# Patient Record
Sex: Male | Born: 1960 | Race: White | Hispanic: No | Smoking: Former smoker
Health system: Southern US, Community
[De-identification: ages and names within clinical notes are randomized; demographics above are authoritative.]

## PROBLEM LIST (undated history)

## (undated) DIAGNOSIS — E119 Type 2 diabetes mellitus without complications: Secondary | ICD-10-CM

## (undated) DIAGNOSIS — T4145XA Adverse effect of unspecified anesthetic, initial encounter: Secondary | ICD-10-CM

## (undated) DIAGNOSIS — N200 Calculus of kidney: Secondary | ICD-10-CM

## (undated) DIAGNOSIS — E669 Obesity, unspecified: Secondary | ICD-10-CM

## (undated) DIAGNOSIS — I10 Essential (primary) hypertension: Secondary | ICD-10-CM

## (undated) DIAGNOSIS — I619 Nontraumatic intracerebral hemorrhage, unspecified: Secondary | ICD-10-CM

## (undated) DIAGNOSIS — I2510 Atherosclerotic heart disease of native coronary artery without angina pectoris: Secondary | ICD-10-CM

## (undated) DIAGNOSIS — G629 Polyneuropathy, unspecified: Secondary | ICD-10-CM

## (undated) DIAGNOSIS — Z8489 Family history of other specified conditions: Secondary | ICD-10-CM

## (undated) DIAGNOSIS — E785 Hyperlipidemia, unspecified: Secondary | ICD-10-CM

## (undated) DIAGNOSIS — R011 Cardiac murmur, unspecified: Secondary | ICD-10-CM

## (undated) DIAGNOSIS — E11610 Type 2 diabetes mellitus with diabetic neuropathic arthropathy: Secondary | ICD-10-CM

## (undated) DIAGNOSIS — G4730 Sleep apnea, unspecified: Secondary | ICD-10-CM

## (undated) DIAGNOSIS — T8859XA Other complications of anesthesia, initial encounter: Secondary | ICD-10-CM

## (undated) HISTORY — PX: VASECTOMY: SHX75

## (undated) HISTORY — PX: FOOT SURGERY: SHX648

## (undated) HISTORY — PX: CARPAL TUNNEL RELEASE: SHX101

## (undated) HISTORY — DX: Atherosclerotic heart disease of native coronary artery without angina pectoris: I25.10

## (undated) HISTORY — DX: Hyperlipidemia, unspecified: E78.5

## (undated) HISTORY — PX: COLONOSCOPY: SHX174

## (undated) HISTORY — PX: OTHER SURGICAL HISTORY: SHX169

---

## 2013-11-17 LAB — HM DIABETES EYE EXAM

## 2014-05-17 HISTORY — PX: NM MYOVIEW LTD: HXRAD82

## 2014-05-17 HISTORY — PX: TRANSTHORACIC ECHOCARDIOGRAM: SHX275

## 2014-06-13 ENCOUNTER — Emergency Department (HOSPITAL_COMMUNITY): Payer: BC Managed Care – PPO

## 2014-06-13 ENCOUNTER — Inpatient Hospital Stay (HOSPITAL_COMMUNITY): Payer: BC Managed Care – PPO

## 2014-06-13 ENCOUNTER — Encounter (HOSPITAL_COMMUNITY): Payer: Self-pay | Admitting: Emergency Medicine

## 2014-06-13 ENCOUNTER — Inpatient Hospital Stay (HOSPITAL_COMMUNITY)
Admission: EM | Admit: 2014-06-13 | Discharge: 2014-06-19 | DRG: 623 | Disposition: A | Discharge status: Home / Self Care | Payer: BC Managed Care – PPO | Attending: Internal Medicine | Admitting: Internal Medicine

## 2014-06-13 DIAGNOSIS — Z87891 Personal history of nicotine dependence: Secondary | ICD-10-CM

## 2014-06-13 DIAGNOSIS — Z6838 Body mass index (BMI) 38.0-38.9, adult: Secondary | ICD-10-CM | POA: Diagnosis not present

## 2014-06-13 DIAGNOSIS — Z9884 Bariatric surgery status: Secondary | ICD-10-CM | POA: Diagnosis not present

## 2014-06-13 DIAGNOSIS — D649 Anemia, unspecified: Secondary | ICD-10-CM | POA: Diagnosis present

## 2014-06-13 DIAGNOSIS — L03116 Cellulitis of left lower limb: Secondary | ICD-10-CM | POA: Diagnosis present

## 2014-06-13 DIAGNOSIS — E669 Obesity, unspecified: Secondary | ICD-10-CM

## 2014-06-13 DIAGNOSIS — Z8673 Personal history of transient ischemic attack (TIA), and cerebral infarction without residual deficits: Secondary | ICD-10-CM

## 2014-06-13 DIAGNOSIS — E1165 Type 2 diabetes mellitus with hyperglycemia: Secondary | ICD-10-CM | POA: Diagnosis present

## 2014-06-13 DIAGNOSIS — R9439 Abnormal result of other cardiovascular function study: Secondary | ICD-10-CM

## 2014-06-13 DIAGNOSIS — G4733 Obstructive sleep apnea (adult) (pediatric): Secondary | ICD-10-CM | POA: Diagnosis present

## 2014-06-13 DIAGNOSIS — B951 Streptococcus, group B, as the cause of diseases classified elsewhere: Secondary | ICD-10-CM | POA: Diagnosis present

## 2014-06-13 DIAGNOSIS — L84 Corns and callosities: Secondary | ICD-10-CM | POA: Diagnosis present

## 2014-06-13 DIAGNOSIS — E11621 Type 2 diabetes mellitus with foot ulcer: Principal | ICD-10-CM | POA: Diagnosis present

## 2014-06-13 DIAGNOSIS — E1169 Type 2 diabetes mellitus with other specified complication: Secondary | ICD-10-CM | POA: Diagnosis present

## 2014-06-13 DIAGNOSIS — E11628 Type 2 diabetes mellitus with other skin complications: Secondary | ICD-10-CM | POA: Diagnosis present

## 2014-06-13 DIAGNOSIS — I5022 Chronic systolic (congestive) heart failure: Secondary | ICD-10-CM | POA: Diagnosis present

## 2014-06-13 DIAGNOSIS — I2510 Atherosclerotic heart disease of native coronary artery without angina pectoris: Secondary | ICD-10-CM | POA: Diagnosis present

## 2014-06-13 DIAGNOSIS — A4901 Methicillin susceptible Staphylococcus aureus infection, unspecified site: Secondary | ICD-10-CM | POA: Diagnosis present

## 2014-06-13 DIAGNOSIS — I369 Nonrheumatic tricuspid valve disorder, unspecified: Secondary | ICD-10-CM

## 2014-06-13 DIAGNOSIS — Z8249 Family history of ischemic heart disease and other diseases of the circulatory system: Secondary | ICD-10-CM | POA: Diagnosis not present

## 2014-06-13 DIAGNOSIS — L97519 Non-pressure chronic ulcer of other part of right foot with unspecified severity: Secondary | ICD-10-CM | POA: Diagnosis present

## 2014-06-13 DIAGNOSIS — E11610 Type 2 diabetes mellitus with diabetic neuropathic arthropathy: Secondary | ICD-10-CM | POA: Diagnosis present

## 2014-06-13 DIAGNOSIS — E785 Hyperlipidemia, unspecified: Secondary | ICD-10-CM | POA: Diagnosis present

## 2014-06-13 DIAGNOSIS — M79672 Pain in left foot: Secondary | ICD-10-CM | POA: Diagnosis present

## 2014-06-13 DIAGNOSIS — L03119 Cellulitis of unspecified part of limb: Secondary | ICD-10-CM

## 2014-06-13 DIAGNOSIS — M216X1 Other acquired deformities of right foot: Secondary | ICD-10-CM | POA: Diagnosis present

## 2014-06-13 DIAGNOSIS — I10 Essential (primary) hypertension: Secondary | ICD-10-CM | POA: Diagnosis present

## 2014-06-13 DIAGNOSIS — R011 Cardiac murmur, unspecified: Secondary | ICD-10-CM | POA: Diagnosis present

## 2014-06-13 DIAGNOSIS — D72829 Elevated white blood cell count, unspecified: Secondary | ICD-10-CM | POA: Diagnosis present

## 2014-06-13 DIAGNOSIS — I6350 Cerebral infarction due to unspecified occlusion or stenosis of unspecified cerebral artery: Secondary | ICD-10-CM

## 2014-06-13 DIAGNOSIS — L02619 Cutaneous abscess of unspecified foot: Secondary | ICD-10-CM

## 2014-06-13 DIAGNOSIS — Z79899 Other long term (current) drug therapy: Secondary | ICD-10-CM | POA: Diagnosis not present

## 2014-06-13 DIAGNOSIS — L97529 Non-pressure chronic ulcer of other part of left foot with unspecified severity: Secondary | ICD-10-CM | POA: Diagnosis present

## 2014-06-13 DIAGNOSIS — Z794 Long term (current) use of insulin: Secondary | ICD-10-CM

## 2014-06-13 DIAGNOSIS — I639 Cerebral infarction, unspecified: Secondary | ICD-10-CM

## 2014-06-13 DIAGNOSIS — L089 Local infection of the skin and subcutaneous tissue, unspecified: Secondary | ICD-10-CM | POA: Diagnosis present

## 2014-06-13 DIAGNOSIS — Z01818 Encounter for other preprocedural examination: Secondary | ICD-10-CM

## 2014-06-13 DIAGNOSIS — I619 Nontraumatic intracerebral hemorrhage, unspecified: Secondary | ICD-10-CM | POA: Diagnosis present

## 2014-06-13 DIAGNOSIS — E1142 Type 2 diabetes mellitus with diabetic polyneuropathy: Secondary | ICD-10-CM | POA: Diagnosis present

## 2014-06-13 DIAGNOSIS — L0390 Cellulitis, unspecified: Secondary | ICD-10-CM | POA: Diagnosis present

## 2014-06-13 DIAGNOSIS — G4730 Sleep apnea, unspecified: Secondary | ICD-10-CM | POA: Diagnosis present

## 2014-06-13 DIAGNOSIS — E1121 Type 2 diabetes mellitus with diabetic nephropathy: Secondary | ICD-10-CM | POA: Diagnosis present

## 2014-06-13 DIAGNOSIS — E119 Type 2 diabetes mellitus without complications: Secondary | ICD-10-CM

## 2014-06-13 HISTORY — DX: Essential (primary) hypertension: I10

## 2014-06-13 HISTORY — DX: Type 2 diabetes mellitus with diabetic neuropathic arthropathy: E11.610

## 2014-06-13 HISTORY — DX: Adverse effect of unspecified anesthetic, initial encounter: T41.45XA

## 2014-06-13 HISTORY — DX: Polyneuropathy, unspecified: G62.9

## 2014-06-13 HISTORY — DX: Sleep apnea, unspecified: G47.30

## 2014-06-13 HISTORY — DX: Type 2 diabetes mellitus without complications: E11.9

## 2014-06-13 HISTORY — DX: Nontraumatic intracerebral hemorrhage, unspecified: I61.9

## 2014-06-13 HISTORY — DX: Other complications of anesthesia, initial encounter: T88.59XA

## 2014-06-13 HISTORY — DX: Cardiac murmur, unspecified: R01.1

## 2014-06-13 HISTORY — DX: Obesity, unspecified: E66.9

## 2014-06-13 LAB — CBC WITH DIFFERENTIAL/PLATELET
BASOS PCT: 0 % (ref 0–1)
Basophils Absolute: 0 10*3/uL (ref 0.0–0.1)
Eosinophils Absolute: 0 10*3/uL (ref 0.0–0.7)
Eosinophils Relative: 0 % (ref 0–5)
HEMATOCRIT: 39.5 % (ref 39.0–52.0)
Hemoglobin: 13.9 g/dL (ref 13.0–17.0)
LYMPHS ABS: 0.9 10*3/uL (ref 0.7–4.0)
Lymphocytes Relative: 6 % — ABNORMAL LOW (ref 12–46)
MCH: 29.8 pg (ref 26.0–34.0)
MCHC: 35.2 g/dL (ref 30.0–36.0)
MCV: 84.8 fL (ref 78.0–100.0)
MONO ABS: 0.9 10*3/uL (ref 0.1–1.0)
MONOS PCT: 6 % (ref 3–12)
NEUTROS ABS: 13.5 10*3/uL — AB (ref 1.7–7.7)
Neutrophils Relative %: 88 % — ABNORMAL HIGH (ref 43–77)
Platelets: 174 10*3/uL (ref 150–400)
RBC: 4.66 MIL/uL (ref 4.22–5.81)
RDW: 12.7 % (ref 11.5–15.5)
WBC: 15.4 10*3/uL — ABNORMAL HIGH (ref 4.0–10.5)

## 2014-06-13 LAB — GLUCOSE, CAPILLARY
GLUCOSE-CAPILLARY: 304 mg/dL — AB (ref 70–99)
Glucose-Capillary: 260 mg/dL — ABNORMAL HIGH (ref 70–99)

## 2014-06-13 LAB — BASIC METABOLIC PANEL
Anion gap: 15 (ref 5–15)
BUN: 16 mg/dL (ref 6–23)
CO2: 25 mEq/L (ref 19–32)
CREATININE: 0.55 mg/dL (ref 0.50–1.35)
Calcium: 9.2 mg/dL (ref 8.4–10.5)
Chloride: 95 mEq/L — ABNORMAL LOW (ref 96–112)
GFR calc Af Amer: >90 mL/min (ref >90)
GFR calc non Af Amer: >90 mL/min (ref >90)
Glucose, Bld: 307 mg/dL — ABNORMAL HIGH (ref 70–99)
Potassium: 4.4 mEq/L (ref 3.7–5.3)
Sodium: 135 mEq/L — ABNORMAL LOW (ref 137–147)

## 2014-06-13 MED ORDER — INSULIN GLARGINE 100 UNIT/ML ~~LOC~~ SOLN
10.0000 [IU] | Freq: Every day | SUBCUTANEOUS | Status: DC
  Administered 2014-06-13: 10 [IU] via SUBCUTANEOUS
  Filled 2014-06-13 (×2): qty 0.1

## 2014-06-13 MED ORDER — GABAPENTIN 600 MG PO TABS: 1200.0000 mg | ORAL_TABLET | Freq: Three times a day (TID) | ORAL | Status: DC

## 2014-06-13 MED ORDER — IRBESARTAN 75 MG PO TABS: 75.0000 mg | ORAL_TABLET | Freq: Every day | ORAL | Status: DC

## 2014-06-13 MED ORDER — VANCOMYCIN HCL 10 G IV SOLR
1500.0000 mg | Freq: Once | INTRAVENOUS | Status: AC
  Administered 2014-06-13: 1500 mg via INTRAVENOUS
  Filled 2014-06-13: qty 1500

## 2014-06-13 MED ORDER — METOPROLOL TARTRATE 1 MG/ML IV SOLN: 5.0000 mg | INTRAVENOUS | Status: DC | PRN

## 2014-06-13 MED ORDER — INSULIN ASPART 100 UNIT/ML ~~LOC~~ SOLN: 0.0000 [IU] | Freq: Every day | SUBCUTANEOUS | Status: DC

## 2014-06-13 MED ORDER — SODIUM CHLORIDE 0.9 % IV SOLN
INTRAVENOUS | Status: DC
Start: 2014-06-13 — End: 2014-06-14
  Administered 2014-06-14: 08:00:00 via INTRAVENOUS

## 2014-06-13 MED ORDER — TETANUS-DIPHTH-ACELL PERTUSSIS 5-2.5-18.5 LF-MCG/0.5 IM SUSP
0.5000 mL | Freq: Once | INTRAMUSCULAR | Status: DC
  Filled 2014-06-13: qty 0.5

## 2014-06-13 MED ORDER — HYDROCODONE-ACETAMINOPHEN 5-325 MG PO TABS
1.0000 | ORAL_TABLET | ORAL | Status: DC | PRN
  Administered 2014-06-14 (×2): 2 via ORAL
  Administered 2014-06-15 – 2014-06-17 (×4): 1 via ORAL
  Administered 2014-06-17: 2 via ORAL
  Filled 2014-06-13: qty 2
  Filled 2014-06-13: qty 1
  Filled 2014-06-13 (×2): qty 2
  Filled 2014-06-13: qty 1
  Filled 2014-06-13: qty 2
  Filled 2014-06-13: qty 1

## 2014-06-13 MED ORDER — VALSARTAN 160 MG PO TABS
320.0000 mg | ORAL_TABLET | Freq: Every day | ORAL | Status: DC
  Filled 2014-06-13: qty 2

## 2014-06-13 MED ORDER — CARVEDILOL 3.125 MG PO TABS
3.1250 mg | ORAL_TABLET | Freq: Two times a day (BID) | ORAL | Status: DC
  Filled 2014-06-13 (×15): qty 1

## 2014-06-13 MED ORDER — VITAMIN D 1000 UNITS PO TABS
20000.0000 [IU] | ORAL_TABLET | ORAL | Status: DC
  Administered 2014-06-14: 20000 [IU] via ORAL
  Filled 2014-06-13: qty 20

## 2014-06-13 MED ORDER — SODIUM CHLORIDE 0.9 % IV SOLN
Freq: Once | INTRAVENOUS | Status: AC
  Administered 2014-06-13: 14:00:00 via INTRAVENOUS

## 2014-06-13 MED ORDER — GUAIFENESIN-DM 100-10 MG/5ML PO SYRP: 5.0000 mL | ORAL_SOLUTION | ORAL | Status: DC | PRN

## 2014-06-13 MED ORDER — GABAPENTIN 400 MG PO CAPS
1200.0000 mg | ORAL_CAPSULE | Freq: Three times a day (TID) | ORAL | Status: DC
  Administered 2014-06-13 – 2014-06-19 (×14): 1200 mg via ORAL
  Filled 2014-06-13 (×19): qty 3

## 2014-06-13 MED ORDER — VANCOMYCIN HCL IN DEXTROSE 1-5 GM/200ML-% IV SOLN
1000.0000 mg | Freq: Once | INTRAVENOUS | Status: AC
  Administered 2014-06-13: 1000 mg via INTRAVENOUS
  Filled 2014-06-13: qty 200

## 2014-06-13 MED ORDER — VANCOMYCIN HCL IN DEXTROSE 1-5 GM/200ML-% IV SOLN
1000.0000 mg | Freq: Two times a day (BID) | INTRAVENOUS | Status: DC
  Administered 2014-06-14 – 2014-06-16 (×5): 1000 mg via INTRAVENOUS
  Filled 2014-06-13 (×5): qty 200

## 2014-06-13 MED ORDER — PIPERACILLIN-TAZOBACTAM 3.375 G IVPB 30 MIN
3.3750 g | Freq: Once | INTRAVENOUS | Status: AC
  Administered 2014-06-13: 3.375 g via INTRAVENOUS
  Filled 2014-06-13: qty 50

## 2014-06-13 MED ORDER — HEPARIN SODIUM (PORCINE) 5000 UNIT/ML IJ SOLN
5000.0000 [IU] | Freq: Three times a day (TID) | INTRAMUSCULAR | Status: DC
  Filled 2014-06-13 (×5): qty 1

## 2014-06-13 MED ORDER — SODIUM CHLORIDE 0.9 % IJ SOLN
3.0000 mL | Freq: Two times a day (BID) | INTRAMUSCULAR | Status: DC
  Administered 2014-06-13 – 2014-06-19 (×6): 3 mL via INTRAVENOUS

## 2014-06-13 MED ORDER — PIPERACILLIN-TAZOBACTAM 3.375 G IVPB
3.3750 g | Freq: Three times a day (TID) | INTRAVENOUS | Status: DC
  Administered 2014-06-13 – 2014-06-19 (×13): 3.375 g via INTRAVENOUS
  Filled 2014-06-13 (×19): qty 50

## 2014-06-13 MED ORDER — POLYETHYLENE GLYCOL 3350 17 G PO PACK
17.0000 g | PACK | Freq: Every day | ORAL | Status: DC | PRN
  Filled 2014-06-13: qty 1

## 2014-06-13 MED ORDER — CANAGLIFLOZIN 300 MG PO TABS
1.0000 | ORAL_TABLET | Freq: Every day | ORAL | Status: DC
  Administered 2014-06-14 – 2014-06-19 (×5): 300 mg via ORAL
  Filled 2014-06-13 (×7): qty 1

## 2014-06-13 MED ORDER — INSULIN ASPART 100 UNIT/ML ~~LOC~~ SOLN: 0.0000 [IU] | Freq: Three times a day (TID) | SUBCUTANEOUS | Status: DC

## 2014-06-13 MED ORDER — MORPHINE SULFATE 2 MG/ML IJ SOLN
2.0000 mg | INTRAMUSCULAR | Status: DC | PRN
Start: 2014-06-13 — End: 2014-06-19
  Filled 2014-06-13: qty 1

## 2014-06-13 NOTE — Progress Notes (Signed)
Order noted for nocturnal CPAP. Patient has home equipment in room and wishes to use tonight. Looks appropriate and turns on/off as it should, although it is missing the power button. When placing machine on the bedside table, the machine shorted out and stopped, but started back when the power cord was readjusted at the port on the machine. Patient educated about safety risks of using machine. He is agreeable to use hospital supplied equipment this admission and states he will follow up with his PCP to obtain a new machine when he goes home. Visitor at bedside, who took his home equipment with her.

## 2014-06-13 NOTE — ED Provider Notes (Addendum)
CSN: [id number]     Arrival date & time 06/13/14  1052 History   First MD Initiated Contact with Patient 06/13/14 1245     Chief Complaint  Patient presents with  . lef foot infected      (Consider location/radiation/quality/duration/timing/severity/associated sxs/prior Treatment) HPI Comments: Patient presents with a foot infection. He has a history of diabetes mellitus. He's had a large callus with underlying ulcer to his right foot for which he has been receiving care at the wound care clinic for about 6 weeks. He noticed that 3 days ago his foot started to swell. Last night he noticed some redness to the top of the foot and he woke up this morning with a large amount of redness and the blister to the top of his foot. He was seen at [facility] urgent care and was advised to come here for further treatment. He denies any fevers. He has had some drainage from the foot over the weekend. He's had prior surgery for hammer toe to the foot about 2 years ago and has hardware in place. The surgery was done in [facility].   Past Medical History  Diagnosis Date  . Diabetes mellitus without complication   . Stroke   . Hypertension   . Sleep apnea    Past Surgical History  Procedure Laterality Date  . Lap band surgery    . Foot surgery     No family history on file. History  Substance Use Topics  . Smoking status: Never Smoker   . Smokeless tobacco: Not on file  . Alcohol Use: No    Review of Systems  Constitutional: Negative for fever, chills, diaphoresis and fatigue.  HENT: Negative for congestion, rhinorrhea and sneezing.   Eyes: Negative.   Respiratory: Negative for cough, chest tightness and shortness of breath.   Cardiovascular: Negative for chest pain and leg swelling.  Gastrointestinal: Negative for nausea, vomiting, abdominal pain, diarrhea and blood in stool.  Genitourinary: Negative for frequency, hematuria, flank pain and difficulty urinating.  Musculoskeletal:  Positive for arthralgias. Negative for back pain.  Skin: Positive for wound. Negative for rash.  Neurological: Negative for dizziness, speech difficulty, weakness, numbness and headaches.      Allergies  Ciprofloxacin; Eggs or egg-derived products; Nsaids; and Statins  Home Medications   Prior to Admission medications   Medication Sig Start Date End Date Taking? Authorizing Provider  Canagliflozin (INVOKANA) 300 MG TABS Take 1 tablet by mouth daily.   Yes Historical Provider, MD  celecoxib (CELEBREX) 400 MG capsule Take 400 mg by mouth 2 (two) times daily.   Yes Historical Provider, MD  cholecalciferol (VITAMIN D) 1000 UNITS tablet Take 20,000-50,000 Units by mouth once a week.   Yes Historical Provider, MD  Cyanocobalamin (VITAMIN B 12 PO) Take 1 tablet by mouth daily.   Yes Historical Provider, MD  gabapentin (NEURONTIN) 600 MG tablet Take 1,200 mg by mouth 3 (three) times daily.   Yes Historical Provider, MD  HYDROcodone-acetaminophen (NORCO/VICODIN) 5-325 MG per tablet Take 1 tablet by mouth every 6 (six) hours as needed for moderate pain.   Yes Historical Provider, MD  metFORMIN (GLUMETZA) 1000 MG (MOD) 24 hr tablet Take 1,000 mg by mouth 2 (two) times daily with a meal.   Yes Historical Provider, MD  Testosterone (ANDROGEL PUMP TD) Place 2 drops onto the skin daily. 2 drops= pumps   Yes Historical Provider, MD  traMADol (ULTRAM) 50 MG tablet Take 100 mg by mouth 3 (three) times daily.  Yes Historical Provider, MD  valsartan (DIOVAN) 320 MG tablet Take 320 mg by mouth daily.   Yes Historical Provider, MD   BP 132/52  Pulse 93  Temp(Src) 98.9 F (37.2 C) (Oral)  Resp 18  Ht 5' 8.5" (1.74 m)  Wt 260 lb (117.935 kg)  BMI 38.95 kg/m2  SpO2 96% Physical Exam  Constitutional: He is oriented to person, place, and time. He appears well-developed and well-nourished.  HENT:  Head: Normocephalic and atraumatic.  Eyes: Pupils are equal, round, and reactive to light.  Neck: Normal  range of motion. Neck supple.  Cardiovascular: Normal rate, regular rhythm and normal heart sounds.   Pulmonary/Chest: Effort normal and breath sounds normal. No respiratory distress. He has no wheezes. He has no rales. He exhibits no tenderness.  Abdominal: Soft. Bowel sounds are normal. There is no tenderness. There is no rebound and no guarding.  Musculoskeletal: Normal range of motion.  Patient has marked erythema and swelling involving the entire left foot. He has a necrotic-appearing blister on the dorsal surface of the foot at the second toe. He has a large callus on the ball of his left foot. There is a 1 cm central syrup with serous drainage from the wound. Pedal pulses are intact.  Lymphadenopathy:    He has no cervical adenopathy.  Neurological: He is alert and oriented to person, place, and time.  Skin: Skin is warm and dry. No rash noted.  Psychiatric: He has a normal mood and affect.    ED Course  Procedures (including critical care time) Labs Review Labs Reviewed  CBC WITH DIFFERENTIAL - Abnormal; Notable for the following:    WBC 15.4 (*)    Neutrophils Relative % 88 (*)    Neutro Abs 13.5 (*)    Lymphocytes Relative 6 (*)    All other components within normal limits  BASIC METABOLIC PANEL - Abnormal; Notable for the following:    Sodium 135 (*)    Chloride 95 (*)    Glucose, Bld 307 (*)    All other components within normal limits  CULTURE, BLOOD (ROUTINE X 2)  CULTURE, BLOOD (ROUTINE X 2)  WOUND CULTURE    Imaging Review Dg Foot Complete Left  06/13/2014   CLINICAL DATA:  Diabetes with left wound in the foot between the first and second toes.  EXAM: LEFT FOOT - COMPLETE 3+ VIEW  COMPARISON:  None.  FINDINGS: Three-view exam shows the patient to be status post fusion across the MTP joint of the great toe after resection of the metatarsal head. There has been resection of the base of the second and third toe proximal phalanges. Prominent soft tissue swelling is seen  in the soft tissues overlying the second metatarsal head. There is gas within the soft tissues which may be related to an open wound or abscess.  On the oblique view, there is some subtle lucency around the second most distal screw of the great toe fusion plate. While no gross bony destruction is evident, infection of the hardware cannot be excluded.  IMPRESSION: Status post first metatarsal head resection with fusion across the joint using a plate and screw fixation device. There is some lucency around the second most distal screw raising the question for infection. Substantial soft tissue swelling is seen between the first and second toes with gas in the soft tissues compatible with an open wound or underlying abscess.   Electronically Signed   By: Kennith Center M.D.   On: 06/13/2014 14:26  EKG Interpretation None      MDM   Final diagnoses:  Cellulitis of foot, left    Patient presents with marked cellulitis of his left foot with an area that looks suspicious for some gangrenous tissue. On x-ray there is a question of some infection around the hardware in the joint. Patient originally had surgery that was done at [facility]. I asked him if he wanted to be transferred there to see his original surgeon. He would rather stay here in [place].  Patient was started on broad-spectrum antibiotics including Zosyn and vancomycin.  15:00 spoke with Dr. Magnus Ivan with orthopedic surgery who will see pt this evening. 15:20 spoke with Dr. Thedore Mins with [facility] who will admit pt.  15:30 pt was found eating in his room.  Advised pt not to eat or drink until seen by ortho.  Rolan Bucco, MD 06/13/14 1520  Rolan Bucco, MD 06/13/14 [phone]

## 2014-06-13 NOTE — ED Notes (Signed)
MD at bedside. EDP BELFI PRESENT

## 2014-06-13 NOTE — ED Notes (Signed)
Pt was seen at [facility] and was told foot was septic and need to go to ED.

## 2014-06-13 NOTE — ED Notes (Signed)
MD at bedside. Cardiology at bedside

## 2014-06-13 NOTE — Progress Notes (Signed)
ANTIBIOTIC CONSULT NOTE - INITIAL  Pharmacy Consult for Vancomycin and Zosyn Indication: Osteomyelitis, foot infection  Allergies  Allergen Reactions  . Tetanus Toxoids     "Sore arm all the way down, Painful, red"  . Ciprofloxacin Other (See Comments)    Altered mental status  . Eggs Or Egg-Derived Products Diarrhea    Constant flu after flu shot.  . Nsaids Other (See Comments)    Cannot take nsaids-  etc due to heart   . Statins Other (See Comments)    York Spaniel it causes pain?    Patient Measurements: Height: 5' 8.5" (174 cm) Weight: 260 lb (117.935 kg) IBW/kg (Calculated) : 69.55  Vital Signs: Temp: 98.9 F (37.2 C) (09/28 1352) Temp src: Oral (09/28 1352) BP: 136/70 mmHg (09/28 1648) Pulse Rate: 101 (09/28 1648) Intake/Output from previous day:   Intake/Output from this shift: Total I/O In: 200 [I.V.:200] Out: -   Labs:  Recent Labs  06/13/14 1330  WBC 15.4*  HGB 13.9  PLT 174  CREATININE 0.55   Estimated Creatinine Clearance: 131.2 ml/min (by C-G formula based on Cr of 0.55). No results found for this basename: VANCOTROUGH, VANCOPEAK, VANCORANDOM, GENTTROUGH, GENTPEAK, GENTRANDOM, TOBRATROUGH, TOBRAPEAK, TOBRARND, AMIKACINPEAK, AMIKACINTROU, AMIKACIN,  in the last 72 hours   Microbiology: No results found for this or any previous visit (from the past 720 hour(s)).  Medical History: Past Medical History  Diagnosis Date  . Diabetes mellitus   . Hemorrhagic stroke     a. Dx 2010. 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 Unclear etiology.  . Hypertension   . Sleep apnea   . Neuropathy   . Diabetic Charcot foot   . Murmur, heart Fall 2014 - diagnosed  . Complication of anesthesia     hx deviated septum, sleep apnea- has had problems  . Obesity     a. hx of lap band surgery.    Medications:  Anti-infectives   Start     Dose/Rate Route Frequency Ordered Stop   06/13/14 1315  piperacillin-tazobactam (ZOSYN) IVPB 3.375 g     3.375 g 100 mL/hr over 30  Minutes Intravenous  Once 06/13/14 1314 06/13/14 1634   06/13/14 1315  vancomycin (VANCOCIN) IVPB 1000 mg/200 mL premix     1,000 mg 200 mL/hr over 60 Minutes Intravenous  Once 06/13/14 1314 06/13/14 1530     Assessment: 53 yoM [facility] ED on [facility]/28 with acute left foot infection and chronic right foot wound. PMH includes DM with chronic charcot changes in right foot; pt states MRI at [facility] was negative for infection. He is now seeking care for left foot pain, redness, drainage. First doses and Vancomycin and Zosyn were given in ED; plan for I&D in OR on 9/29. Pharmacy is consulted to dose Vancomycin and Zosyn.  9/28 >> Vanc >> 9/28 >> Zosyn >>   Today, 9/28   Tmax: 98.9  WBCs: 15.4  Renal: SCr 0.55, CrCl > 100 ml/min CG, N  Blood and wound cultures pending   Goal of Therapy:  Vancomycin trough level 15-20 mcg/ml Appropriate abx dosing, eradication of infection.   Plan:   Zosyn 3.375g IV Q8H infused over 4hrs.   Vancomycin  (along with  already given for total loading dose )  Vancomycin 1g IV q12h.  Measure Vanc trough at steady state.  Follow up renal fxn and culture results.   Lynann Beaver PharmD, BCPS Pager [phone] 06/13/2014 6:16 PM

## 2014-06-13 NOTE — Progress Notes (Signed)
Utilization review completed.  

## 2014-06-13 NOTE — ED Notes (Signed)
Xray and Ultrasound at bedside.

## 2014-06-13 NOTE — Progress Notes (Signed)
Echocardiogram 2D Echocardiogram has been performed.  Evann Koelzer 06/13/2014, 4:45 PM

## 2014-06-13 NOTE — Consult Note (Signed)
Patient seen and personally examined and chart reviewed.  Agree with note as outlined by Lucile Crater.  No history of chest pain or cardiac disease other than heart murmur.  Was exercising some up until 6 months ago when he stopped because he has a new girlfriend and had no time.  Last nuclear stress test in 2009.  His CRF include age morbid obesity, HTN, DM. Cannot assess for exertional angina due to inability to exercise recently and he has not really exercised in 6 months.  His EKG shows early R wave transition and cannot rule out old posterior MI.  ECHO showed mildly reduced LVF with EF 45-50% but technically poor acoustical windows so cannot rule out RWMA's.  I think it is prudent to risk stratify further for ischemia since we cannot assess if he has exertional angina and he has multiple CRF.  Will make NPO after MN for Lexiscan myoview in am.

## 2014-06-13 NOTE — Consult Note (Signed)
Reason for Consult:  Left foot with acute infection and right foot with chronic wound Referring Physician:   EDP  Alexander Pennington is an 53 y.o. male.  HPI: 53 yo male diabetic with bilateral foot issues.  Is normally seen at [facility] in [place], but now prefers to be seen here.  He has had chronic charcot changes to his right foot and a wound that has been treated for a long period of time.  He states that a MRI at [facility] was negative for an infection.  However, he developed acute left foot pain, swelling, redness, and drainage over the past few days and came to [facility] in [place] for further evaluation and treatment.  He does report a left foot 1st MTP fusion about 2 years ago.  Past Medical History  Diagnosis Date  . Diabetes mellitus   . Hemorrhagic stroke     a. Dx 2010. 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 Unclear etiology.  . Hypertension   . Sleep apnea   . Neuropathy   . Diabetic Charcot foot   . Murmur, heart Fall 2014 - diagnosed  . Complication of anesthesia     hx deviated septum, sleep apnea- has had problems  . Obesity     a. hx of lap band surgery.    Past Surgical History  Procedure Laterality Date  . Lap band surgery      2009  . Foot surgery      foot surgery x 2  . Carpal tunnel release Left     bone removed also, nerve also cut    Family History  Problem Relation Age of Onset  . Heart attack Father     Social History:  reports that he quit smoking about 26 years ago. His smoking use included Cigarettes. He smoked 0.00 packs per day. He has never used smokeless tobacco. He reports that he drinks alcohol. He reports that he does not use illicit drugs.  Allergies:  Allergies  Allergen Reactions  . Tetanus Toxoids     "Sore arm all the way down, Painful, red"  . Ciprofloxacin Other (See Comments)    Altered mental status  . Eggs Or Egg-Derived Products Diarrhea    Constant flu after flu shot.  . Nsaids Other (See Comments)   Cannot take nsaids-  etc due to heart   . Statins Other (See Comments)    Michela Pitcher it causes pain?    Medications: I have reviewed the patient's current medications.  Results for orders placed during the hospital encounter of 06/13/14 (from the past 48 hour(s))  CBC WITH DIFFERENTIAL     Status: Abnormal   Collection Time    06/13/14  1:30 PM      Result Value Ref Range   WBC 15.4 (*) 4.0 - 10.5 K/uL   RBC 4.66  4.22 - 5.81 MIL/uL   Hemoglobin 13.9  13.0 - 17.0 g/dL   HCT 39.5  39.0 - 52.0 %   MCV 84.8  78.0 - 100.0 fL   MCH 29.8  26.0 - 34.0 pg   MCHC 35.2  30.0 - 36.0 g/dL   RDW 12.7  11.5 - 15.5 %   Platelets 174  150 - 400 K/uL   Neutrophils Relative % 88 (*) 43 - 77 %   Neutro Abs 13.5 (*) 1.7 - 7.7 K/uL   Lymphocytes Relative 6 (*) 12 - 46 %   Lymphs Abs 0.9  0.7 - 4.0 K/uL   Monocytes Relative 6  3 - 12 %   Monocytes Absolute 0.9  0.1 - 1.0 K/uL   Eosinophils Relative 0  0 - 5 %   Eosinophils Absolute 0.0  0.0 - 0.7 K/uL   Basophils Relative 0  0 - 1 %   Basophils Absolute 0.0  0.0 - 0.1 K/uL  BASIC METABOLIC PANEL     Status: Abnormal   Collection Time    06/13/14  1:30 PM      Result Value Ref Range   Sodium 135 (*) 137 - 147 mEq/L   Potassium 4.4  3.7 - 5.3 mEq/L   Chloride 95 (*) 96 - 112 mEq/L   CO2 25  19 - 32 mEq/L   Glucose, Bld 307 (*) 70 - 99 mg/dL   BUN 16  6 - 23 mg/dL   Creatinine, Ser 0.55  0.50 - 1.35 mg/dL   Calcium 9.2  8.4 - 10.5 mg/dL   GFR calc non Af Amer >90  >90 mL/min   GFR calc Af Amer >90  >90 mL/min   Comment: (NOTE)     The eGFR has been calculated using the CKD EPI equation.     This calculation has not been validated in all clinical situations.     eGFR's persistently <90 mL/min signify possible Chronic Kidney     Disease.   Anion gap 15  5 - 15    Dg Chest Port 1 View  06/13/2014   CLINICAL DATA:  Asthma. Heart murmur. Short of breath. Preoperative chest radiograph.  EXAM: PORTABLE CHEST - 1 VIEW  COMPARISON:  None.  FINDINGS:  Cardiopericardial silhouette within normal limits. Mediastinal contours normal. Trachea midline. No airspace disease or effusion. Mild basilar atelectasis. Lung volumes are mildly reduced.  IMPRESSION: No active disease.   Electronically Signed   By: Dereck Ligas M.D.   On: 06/13/2014 16:18   Dg Foot Complete Left  06/13/2014   CLINICAL DATA:  Diabetes with left wound in the foot between the first and second toes.  EXAM: LEFT FOOT - COMPLETE 3+ VIEW  COMPARISON:  None.  FINDINGS: Three-view exam shows the patient to be status post fusion across the MTP joint of the great toe after resection of the metatarsal head. There has been resection of the base of the second and third toe proximal phalanges. Prominent soft tissue swelling is seen in the soft tissues overlying the second metatarsal head. There is gas within the soft tissues which may be related to an open wound or abscess.  On the oblique view, there is some subtle lucency around the second most distal screw of the great toe fusion plate. While no gross bony destruction is evident, infection of the hardware cannot be excluded.  IMPRESSION: Status post first metatarsal head resection with fusion across the joint using a plate and screw fixation device. There is some lucency around the second most distal screw raising the question for infection. Substantial soft tissue swelling is seen between the first and second toes with gas in the soft tissues compatible with an open wound or underlying abscess.   Electronically Signed   By: Misty Stanley M.D.   On: 06/13/2014 14:26    ROS Blood pressure 136/70, pulse 101, temperature 98.9 F (37.2 C), temperature source Oral, resp. rate 12, height 5' 8.5" (1.74 m), weight 117.935 kg (260 lb), SpO2 98.00%. 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 2)  He will be started on IV antibiotics and the plan will be to take him to the OR late tomorrow 9/29 for an irrigation and debridement of both feet.  He may eventually need hardware removed from his left foot MTP joint.  3)  He needs goo blood glucose control.   Mcarthur Rossetti 06/13/2014, 5:28 PM

## 2014-06-13 NOTE — Progress Notes (Signed)
Pt refuses doctor recommended diet. Pt wants to control his diet while in the hospital.

## 2014-06-13 NOTE — Consult Note (Signed)
Cardiology Consultation Note  Patient ID: Alexander Pennington, MRN: [id number], DOB/AGE: 1960/10/21 53 y.o. 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 Prior echo 2005 showed normal LVF, mild LVH, grade 2/2. He reports an echo in 08/2013 at [facility] for a heart murmur that showed "85% capacity" and mildly leaky valve - report is not available in CareEverywhere. He also reports what sounds like LE arterial duplex several months ago and says he was told the results were "okay and in line with what's expected for a diabetic." He has h/o foot surgery with hardware 2 years ago. Over the last several days, he developed subjective fever and increased redness of the left foot associated with a blister that popped so presented to the ED for evaluation. Workup is consistent with left foot cellulitis/abscess, possible osteomyelitis. He also has a right foot plantar open sore with infection. Orthopedics was consulted. It is felt he will likely require surgery and PICC with prolonged abx. We are asked to see for perioperative risk assessment. He used to exercise 6 months ago going 2. at 8% incline for 2 miles but says he stopped this due to a girlfriend entering the picture. He denies any exertional chest pain or dyspnea with ADLs or intercourse. No syncope, palpitations, nausea, vomiting, or prior MI. He has not had any chest pain or pressure at rest.   Past Medical History  Diagnosis Date  . Diabetes mellitus without complication   . Hemorrhagic stroke     a. Dx 2010. Patient  reports it was venous related. Unclear etiology.  . Hypertension   . Sleep apnea   . Neuropathy   . Diabetic Charcot foot   . Murmur, heart Fall 2014 - diagnosed  . Complication of anesthesia     hx deviated septum, sleep apnea- has had problems  . Obesity       Most Recent Cardiac Studies: 2D echo 2005 per Care Everywhere INTERPRETATION --------------------------------------------------------------- NORMAL LEFT VENTRICULAR FUNCTION WITH MILD LVH DIASTOLIC FUNCTION CLASS: RELAXATION ABNORMALITY (GRADE 2) CORRESPONDS TO PSEUDONORMAL VALVULAR REGURGITATION: TRIVIAL MR, TRIVIAL TR NO VALVULAR STENOSIS   Surgical History:  Past Surgical History  Procedure Laterality Date  . Lap band surgery    . Foot surgery      foot surgery x 2  . Heart ultrasound    . Carpal tunnel release Left     bone removed also, nerve also cut     Home Meds: Prior to Admission medications   Medication Sig Start Date End Date Taking? Authorizing Provider  Canagliflozin (INVOKANA) 300 MG TABS Take 1 tablet by mouth daily.   Yes Historical Provider, MD  celecoxib (CELEBREX) 400 MG capsule Take 400 mg by mouth 2 (two) times daily.   Yes Historical Provider, MD  cholecalciferol (VITAMIN D) 1000 UNITS tablet Take 20,000-50,000 Units by mouth once a week.   Yes Historical Provider, MD  Folic Acid-Vit B6-Vit B12 ([name]) 2.5-25-1 MG TABS tablet Take 1 tablet by mouth daily.   Yes Historical Provider, MD  gabapentin (NEURONTIN) 600 MG tablet Take 1,200 mg by mouth 3 (three) times  daily.   Yes Historical Provider, MD  HYDROcodone-acetaminophen (NORCO/VICODIN) 5-325 MG per tablet Take 1 tablet by mouth every 6 (six) hours as needed for moderate pain.   Yes Historical Provider, MD  metFORMIN (GLUMETZA) 1000 MG (MOD) 24 hr tablet Take 1,000 mg by mouth 2 (two) times daily with a meal.   Yes Historical Provider, MD  Testosterone (ANDROGEL PUMP TD) Place 2 drops onto the skin daily. 2 drops= pumps   Yes Historical  Provider, MD  traMADol (ULTRAM) 50 MG tablet Take 100 mg by mouth 3 (three) times daily.   Yes Historical Provider, MD  valsartan (DIOVAN) 320 MG tablet Take 320 mg by mouth daily.   Yes Historical Provider, MD    Inpatient Medications:  . carvedilol  3.125 mg Oral BID WC  . insulin aspart  0-15 Units Subcutaneous TID WC  . insulin aspart  0-5 Units Subcutaneous QHS  . insulin glargine  10 Units Subcutaneous Daily      Allergies:  Allergies  Allergen Reactions  . Tetanus Toxoids     "Sore arm all the way down, Painful, red"  . Ciprofloxacin Other (See Comments)    Altered mental status  . Eggs Or Egg-Derived Products Diarrhea    Constant flu after flu shot.  . Nsaids Other (See Comments)    Cannot take nsaids-  etc due to heart   . Statins Other (See Comments)    York Spaniel it causes pain?    History   Social History  . Marital Status: Single    Spouse Name: N/A    Number of Children: N/A  . Years of Education: N/A   Occupational History  . Not on file.   Social History Main Topics  . Smoking status: Former Smoker    Types: Cigarettes    Quit date: 06/13/1988  . Smokeless tobacco: Never Used  . Alcohol Use: Yes     Comment: "on Saturday and Wednesday"  . Drug Use: No  . Sexual Activity: Not on file   Other Topics Concern  . Not on file   Social History Narrative  . No narrative on file     Family History  Problem Relation Age of Onset  . Heart attack Father      Review of Systems: All other systems reviewed and are otherwise negative except as noted above.  Labs:  Lab Results  Component Value Date   WBC 15.4* 06/13/2014   HGB 13.9 06/13/2014   HCT 39.5 06/13/2014   MCV 84.8 06/13/2014   PLT 174 06/13/2014    Recent Labs Lab 06/13/14 1330  NA 135*  K 4.4  CL 95*  CO2 25  BUN 16  CREATININE 0.55  CALCIUM 9.2  GLUCOSE 307*    Radiology/Studies:  Dg Chest Port 1 View  06/13/2014   CLINICAL DATA:  Asthma. Heart murmur. Short of breath.  Preoperative chest radiograph.  EXAM: PORTABLE CHEST - 1 VIEW  COMPARISON:  None.  FINDINGS: Cardiopericardial silhouette within normal limits. Mediastinal contours normal. Trachea midline. No airspace disease or effusion. Mild basilar atelectasis. Lung volumes are mildly reduced.  IMPRESSION: No active disease.   Electronically Signed   By: Andreas Newport M.D.   On: 06/13/2014 16:18   Dg Foot Complete Left  06/13/2014   CLINICAL DATA:  Diabetes with left wound in the foot between the first and second toes.  EXAM: LEFT FOOT - COMPLETE 3+ VIEW  COMPARISON:  None.  FINDINGS: Three-view exam shows the patient to be  status post fusion across the MTP joint of the great toe after resection of the metatarsal head. There has been resection of the base of the second and third toe proximal phalanges. Prominent soft tissue swelling is seen in the soft tissues overlying the second metatarsal head. There is gas within the soft tissues which may be related to an open wound or abscess.  On the oblique view, there is some subtle lucency around the second most distal screw of the great toe fusion plate. While no gross bony destruction is evident, infection of the hardware cannot be excluded.  IMPRESSION: Status post first metatarsal head resection with fusion across the joint using a plate and screw fixation device. There is some lucency around the second most distal screw raising the question for infection. Substantial soft tissue swelling is seen between the first and second toes with gas in the soft tissues compatible with an open wound or underlying abscess.   Electronically Signed   By: Kennith Center M.D.   On: 06/13/2014 14:26   EKG: sinus tach 100bpm, left axis deviation, NSIVCD, nonspecific ST-T changes but no acute changes  Physical Exam: Blood pressure 132/52, pulse 93, temperature 98.9 F (37.2 C), temperature source Oral, resp. rate 18, height 5' 8.5" (1.74 m), weight 260 lb (117.935 kg), SpO2 96.00%. General:  Well developed, well nourished obese WM in no acute distress. Head: Normocephalic, atraumatic, sclera non-icteric, no xanthomas, nares are without discharge.  Neck: Negative for carotid bruits. JVD not elevated. Lungs: Clear bilaterally to auscultation without wheezes, rales, or rhonchi. Breathing is unlabored. Heart: RRR with S1 S2. 2/6 LLSB. No rubs or gallops appreciated. Abdomen: Soft, non-tender, non-distended with normoactive bowel sounds. No hepatomegaly. No rebound/guarding. No obvious abdominal masses. Msk:  Strength and tone appear normal for age. Extremities: No clubbing or cyanosis. R foot no edema. L foot is wrapped. Neuro: Alert and oriented X 3. No facial asymmetry. No focal deficit. Moves all extremities spontaneously. Psych:  Responds to questions appropriately with a normal affect.   Assessment and Plan:   1. Diabetic foot ulcers with cellulitis and possible osteomyelitis 2. H/o heart murmur, echo being done at the moment 3. HTN, controlled in ED 4. Diabetes mellitus, suspect uncontrolled 5. Obesity s/p lap band 2009, still with BMI 38.95 kg/(m^2). 6. OSA, compliant with CPAP 7. H/o hemorrhagic stroke 2010 8. Dyslipidemia, intolerant of statins  Patient seen/examined with Dr. Mayford Knife. The exact nature of his surgery has not yet been outlined but the patient described what sounded like a debridement-type procedure. Await 2D echocardiogram results. Dr. Mayford Knife feels if these are satisfactory that he may undergo his surgery. He denies any anginal-type symptoms but should continue to work on risk factor modification with attention to diet/exercise as tolerated in his recovery as well as improved diabetic control. He says PCP keeps close eye on lipids but unfortunately says he is intolerant of all statins - he should continue to follow with PCP to determine if other options are feasible i.e. addition of Zetia perhaps if clinically indicated.  Signed, Alexander Spies PA-C 06/13/2014,  4:34 PM

## 2014-06-13 NOTE — ED Notes (Signed)
Orthopedics at bedside 

## 2014-06-13 NOTE — H&P (Addendum)
Patient Demographics  Maliq Pilley, is a 53 y.o. male  MRN: [id number]   DOB - 12/09/1960  Admit Date - 06/13/2014  Outpatient Primary MD for the patient is PROVIDER NOT IN SYSTEM   With History of -  Past Medical History  Diagnosis Date  . Diabetes mellitus without complication   . Stroke   . Hypertension   . Sleep apnea   . Neuropathy   . Diabetic Charcot foot   . Murmur, heart Fall 2014 - diagnosed  . Complication of anesthesia     hx deviated septum, sleep apena- has had problems      Past Surgical History  Procedure Laterality Date  . Lap band surgery    . Foot surgery      foot surgery x 2  . Heart ultrasound      in for   Chief Complaint  Patient presents with  . lef foot infected      HPI  Azavier Creson  is a 53 y.o. male, type 2 diabetes mellitus not on insulin, Charcot feet, diabetic neuropathy, essential hypertension, stroke, sleep apnea on CPAP each bedtime, heart murmur, is free of complications with anesthesia, history of artificial hardware in his left foot, history of open sore on the plantar aspect of his right foot for the last few weeks, was fairly well until he started wearing a new shoe few weeks ago, developed a small blister on the side of his left big toe, for the last 2 days he developed redness around his left foot with the open sore between the left first and second toe, some fever chills for the last 2 days, also has had a sore on the plantar aspect of his right foot for the last several weeks. In the past he has had some foot surgery done in [facility] by Dr. Ronda Fairly.  In the ER workup consistent with left foot cellulitis/abscess, possible or stimuli tests. Right foot plantar open sore with infection, orthopedics was consulted who requested hospitalist admission.    Review of  Systems    In addition to the HPI above,   + Fever-chills, No Headache, No changes with Vision or hearing, No problems swallowing food or Liquids, No Chest pain, Cough or Shortness of Breath, No Abdominal pain, No Nausea or Vommitting, Bowel movements are regular, No Blood in stool or Urine, No dysuria, No new skin rashes or bruises, except as above No new joints pains-aches,  No new weakness, tingling, numbness in any extremity, No recent weight gain or loss, No polyuria, polydypsia or polyphagia, No significant Mental Stressors.  A full 10 point Review of Systems was done, except as stated above, all other Review of Systems were negative.   Social History History  Substance Use Topics  . Smoking status: Former Smoker    Types: Cigarettes    Quit date: 06/13/1988  . Smokeless tobacco: Never Used  . Alcohol Use: Yes     Comment: "  on Saturday and Wednesday"      Family History Family History  Problem Relation Age of Onset  . Heart attack Father       Prior to Admission medications   Medication Sig Start Date End Date Taking? Authorizing Provider  Canagliflozin (INVOKANA) 300 MG TABS Take 1 tablet by mouth daily.   Yes Historical Provider, MD  celecoxib (CELEBREX) 400 MG capsule Take 400 mg by mouth 2 (two) times daily.   Yes Historical Provider, MD  cholecalciferol (VITAMIN D) 1000 UNITS tablet Take 20,000-50,000 Units by mouth once a week.   Yes Historical Provider, MD  Cyanocobalamin (VITAMIN B 12 PO) Take 1 tablet by mouth daily.   Yes Historical Provider, MD  gabapentin (NEURONTIN) 600 MG tablet Take 1,200 mg by mouth 3 (three) times daily.   Yes Historical Provider, MD  HYDROcodone-acetaminophen (NORCO/VICODIN) 5-325 MG per tablet Take 1 tablet by mouth every 6 (six) hours as needed for moderate pain.   Yes Historical Provider, MD  metFORMIN (GLUMETZA) 1000 MG (MOD) 24 hr tablet Take 1,000 mg by mouth 2 (two) times daily with a meal.   Yes Historical Provider, MD    Testosterone (ANDROGEL PUMP TD) Place 2 drops onto the skin daily. 2 drops= pumps   Yes Historical Provider, MD  traMADol (ULTRAM) 50 MG tablet Take 100 mg by mouth 3 (three) times daily.   Yes Historical Provider, MD  valsartan (DIOVAN) 320 MG tablet Take 320 mg by mouth daily.   Yes Historical Provider, MD    Allergies  Allergen Reactions  . Ciprofloxacin Other (See Comments)    Altered mental status  . Eggs Or Egg-Derived Products Diarrhea    Constant flu after flu shot.  . Nsaids Other (See Comments)    Cannot take nsaids-  etc due to heart   . Statins Other (See Comments)    [place] Spaniel it causes pain?    Physical Exam  Vitals  Blood pressure 132/52, pulse 93, temperature 98.9 F (37.2 C), temperature source Oral, resp. rate 18, height 5' 8.5" (1.74 m), weight 117.935 kg (260 lb), SpO2 96.00%.   1. General obese white male lying in bed in NAD,    2. Normal affect and insight, Not Suicidal or Homicidal, Awake Alert, Oriented X 3.  3. No F.N deficits, ALL C.Nerves Intact, Strength 5/5 all 4 extremities, Sensation intact all 4 extremities, Plantars down going.  4. Ears and Eyes appear Normal, Conjunctivae clear, PERRLA. Moist Oral Mucosa.  5. Supple Neck, No JVD, No cervical lymphadenopathy appriciated, No Carotid Bruits.  6. Symmetrical Chest wall movement, Good air movement bilaterally, CTAB.  7. RRR, No Gallops, Rubs , loud mitral valve systolic murmur, No Parasternal Heave.  8. Positive Bowel Sounds, Abdomen Soft, No tenderness, No organomegaly appriciated,No rebound -guarding or rigidity.  9.  No Cyanosis, Normal Skin Turgor, No Skin Rash or Bruise.  10. Good muscle tone,  joints appear normal , no effusions, Normal ROM. Left foot under bandage redness up to mid foot, draining wound between first and second toe, right foot on the plantar aspect there is open sore about 1 cm in diameter with some pus in it.  11. No Palpable Lymph Nodes in Neck or Axillae     Data  Review  CBC  Recent Labs Lab 06/13/14 1330  WBC 15.4*  HGB 13.9  HCT 39.5  PLT 174  MCV 84.8  MCH 29.8  MCHC 35.2  RDW 12.7  LYMPHSABS 0.9  MONOABS 0.9  EOSABS 0.0  BASOSABS 0.0   ------------------------------------------------------------------------------------------------------------------  Chemistries   Recent Labs Lab 06/13/14 1330  NA 135*  K 4.4  CL 95*  CO2 25  GLUCOSE 307*  BUN 16  CREATININE 0.55  CALCIUM 9.2   ------------------------------------------------------------------------------------------------------------------ estimated creatinine clearance is 131.2 ml/min (by C-G formula based on Cr of 0.55). ------------------------------------------------------------------------------------------------------------------ No results found for this basename: TSH, T4TOTAL, FREET3, T3FREE, THYROIDAB,  in the last 72 hours   Coagulation profile No results found for this basename: INR, PROTIME,  in the last 168 hours ------------------------------------------------------------------------------------------------------------------- No results found for this basename: DDIMER,  in the last 72 hours -------------------------------------------------------------------------------------------------------------------  Cardiac Enzymes No results found for this basename: CK, CKMB, TROPONINI, MYOGLOBIN,  in the last 168 hours ------------------------------------------------------------------------------------------------------------------ No components found with this basename: POCBNP,    ---------------------------------------------------------------------------------------------------------------  Urinalysis No results found for this basename: colorurine, appearanceur, labspec, phurine, glucoseu, hgbur, bilirubinur, ketonesur, proteinur, urobilinogen, nitrite, leukocytesur     ----------------------------------------------------------------------------------------------------------------  Imaging results:   Dg Foot Complete Left  06/13/2014   CLINICAL DATA:  Diabetes with left wound in the foot between the first and second toes.  EXAM: LEFT FOOT - COMPLETE 3+ VIEW  COMPARISON:  None.  FINDINGS: Three-view exam shows the patient to be status post fusion across the MTP joint of the great toe after resection of the metatarsal head. There has been resection of the base of the second and third toe proximal phalanges. Prominent soft tissue swelling is seen in the soft tissues overlying the second metatarsal head. There is gas within the soft tissues which may be related to an open wound or abscess.  On the oblique view, there is some subtle lucency around the second most distal screw of the great toe fusion plate. While no gross bony destruction is evident, infection of the hardware cannot be excluded.  IMPRESSION: Status post first metatarsal head resection with fusion across the joint using a plate and screw fixation device. There is some lucency around the second most distal screw raising the question for infection. Substantial soft tissue swelling is seen between the first and second toes with gas in the soft tissues compatible with an open wound or underlying abscess.   Electronically Signed   By: Kennith Center M.D.   On: 06/13/2014 14:26    Baseline EKG, chest x-ray and echogram ordered    Assessment & Plan   1. Left foot diabetic ulcer with cellulitis and abscess possible osteomyelitis, right foot plantar aspect open sore with possible infection and cellulitis. Will be admitted to a telemetry bed, blood wound cultures have been drawn in the ER, IV Vanco and Zosyn since he is diabetic. Orthopedic surgery to see discussed with Dr. Magnus Ivan. Likely surgery tomorrow afternoon. He most likely will require PICC line and prolonged IV antibiotics this has been discussed and  explained to the patient.   Cardio-Pulm Risk stratification for surgery and recommendations to minimize the same:-  A.Cardio-Pulmonary Risk -  this patient is a moderate to high for adverse Cardio-Pulmonary  Outcome  from surgery, the risks and benefits were discussed and acceptable to the patient.    He has had previous complications with anesthesia, has poorly controlled diabetes mellitus which is a CAD equivalent, has a loud murmur at rest. We'll get baseline EKG, chest x-ray and echogram. Request cardiology to evaluate her perioperative risk.    Recommendations for optimizing Cardio-Pulmonary  Risk risk factors    1. Keep SBP<140, HR<85, use Lopressor  IV q4hrs PRN, or B.Blocker drip PRN. 2.  Moniotr I&Os. 3. Minimal sedation and Narcotics. 4. Good pulmunary toilet. 5. PRN Nebs and as needed oxygen to keep Pox>90% 6. Hb>8, transfuse as needed- Lasix  IV after each unit PRBC Transfused.   B.Bleeding Risk - no previous surgical complications, no easy bruising,  Antiplate meds none.   Lab Results  Component Value Date   PLT 174 06/13/2014                  No results found for this basename: INR, PROTIME      Will request Surgeon to please Order DVT prophylaxis of his/her choice, along with activity, weight bearing precautions and diet if appropriate.      2. DM type II. Hold Glucophage, continue Invocana, sugars greater than 300, 10 units of Lantus daily along with moderate dose sliding scale added, check A1c monitor CBGs closely.    3. Essential hypertension. We'll add Coreg for some cardioprotection and blood pressure heart rate control, continue ARB at a slightly lower dose and monitor   4. Diabetic neuropathy. Continue Neurontin.    5.OSA - will bring home CPAP device.     DVT Prophylaxis Heparin    AM Labs Ordered, also please review Full Orders  Family Communication: Admission, patients condition and plan of care including tests being ordered  have been discussed with the patient  who indicates understanding and agree with the plan and Code Status.  Code Status full  Likely DC to  Home  Condition Fair  Time spent in minutes : 35    Devonta Blanford K M.D on 06/13/2014 at 3:55 PM  Between 7am to 7pm - Pager - [phone]  After 7pm go to [email or web address] - password [email or web address]  And look for the night coverage person covering me after hours  [facility] Office  [phone]   **Disclaimer: This note may have been dictated with voice recognition software. Similar sounding words can inadvertently be transcribed and this note may contain transcription errors which may not have been corrected upon publication of note.**

## 2014-06-14 ENCOUNTER — Encounter (HOSPITAL_COMMUNITY)
Admission: EM | Disposition: A | Discharge status: Home / Self Care | Payer: Self-pay | Source: Home / Self Care | Attending: Internal Medicine

## 2014-06-14 LAB — CBC
HCT: 36.1 % — ABNORMAL LOW (ref 39.0–52.0)
Hemoglobin: 12.4 g/dL — ABNORMAL LOW (ref 13.0–17.0)
MCH: 29.7 pg (ref 26.0–34.0)
MCHC: 34.3 g/dL (ref 30.0–36.0)
MCV: 86.4 fL (ref 78.0–100.0)
Platelets: UNDETERMINED K/uL (ref 150–400)
RBC: 4.18 MIL/uL — ABNORMAL LOW (ref 4.22–5.81)
RDW: 13 % (ref 11.5–15.5)
WBC: 9.2 K/uL (ref 4.0–10.5)

## 2014-06-14 LAB — GLUCOSE, CAPILLARY
GLUCOSE-CAPILLARY: 246 mg/dL — AB (ref 70–99)
GLUCOSE-CAPILLARY: 159 mg/dL — AB (ref 70–99)
Glucose-Capillary: 220 mg/dL — ABNORMAL HIGH (ref 70–99)
Glucose-Capillary: 171 mg/dL — ABNORMAL HIGH (ref 70–99)
Glucose-Capillary: 172 mg/dL — ABNORMAL HIGH (ref 70–99)

## 2014-06-14 LAB — BASIC METABOLIC PANEL WITH GFR
Anion gap: 11 (ref 5–15)
BUN: 14 mg/dL (ref 6–23)
CO2: 25 meq/L (ref 19–32)
Calcium: 8.5 mg/dL (ref 8.4–10.5)
Chloride: 99 meq/L (ref 96–112)
Creatinine, Ser: 0.57 mg/dL (ref 0.50–1.35)
GFR calc Af Amer: >90 mL/min (ref >90)
GFR calc non Af Amer: >90 mL/min (ref >90)
Glucose, Bld: 270 mg/dL — ABNORMAL HIGH (ref 70–99)
Potassium: 3.9 meq/L (ref 3.7–5.3)
Sodium: 135 meq/L — ABNORMAL LOW (ref 137–147)

## 2014-06-14 LAB — SURGICAL PCR SCREEN
MRSA, PCR: NEGATIVE
Staphylococcus aureus: NEGATIVE

## 2014-06-14 LAB — HEMOGLOBIN A1C
Hgb A1c MFr Bld: 8.2 % — ABNORMAL HIGH (ref <5.7)
Mean Plasma Glucose: 189 mg/dL — ABNORMAL HIGH (ref <117)

## 2014-06-14 SURGERY — INCISION AND DRAINAGE
Anesthesia: General | Laterality: Bilateral

## 2014-06-14 MED ORDER — HEPARIN SODIUM (PORCINE) 5000 UNIT/ML IJ SOLN
5000.0000 [IU] | Freq: Three times a day (TID) | INTRAMUSCULAR | Status: DC
  Administered 2014-06-14 – 2014-06-19 (×11): 5000 [IU] via SUBCUTANEOUS
  Filled 2014-06-14 (×17): qty 1

## 2014-06-14 MED ORDER — INSULIN NPH (HUMAN) (ISOPHANE) 100 UNIT/ML ~~LOC~~ SUSP
6.0000 [IU] | Freq: Two times a day (BID) | SUBCUTANEOUS | Status: DC
  Administered 2014-06-14: 6 [IU] via SUBCUTANEOUS
  Filled 2014-06-14: qty 10

## 2014-06-14 MED ORDER — VALSARTAN 40 MG PO TABS
40.0000 mg | ORAL_TABLET | Freq: Every day | ORAL | Status: DC
  Administered 2014-06-15 – 2014-06-19 (×3): 40 mg via ORAL
  Filled 2014-06-14 (×5): qty 1

## 2014-06-14 MED ORDER — TRAMADOL HCL 50 MG PO TABS
100.0000 mg | ORAL_TABLET | Freq: Three times a day (TID) | ORAL | Status: DC
  Administered 2014-06-14 – 2014-06-19 (×13): 100 mg via ORAL
  Filled 2014-06-14 (×13): qty 2

## 2014-06-14 MED ORDER — VALSARTAN 160 MG PO TABS
160.0000 mg | ORAL_TABLET | Freq: Every day | ORAL | Status: DC
  Administered 2014-06-14: 160 mg via ORAL

## 2014-06-14 MED ORDER — INSULIN ASPART 100 UNIT/ML ~~LOC~~ SOLN
0.0000 [IU] | Freq: Three times a day (TID) | SUBCUTANEOUS | Status: DC
  Administered 2014-06-14 – 2014-06-17 (×2): 2 [IU] via SUBCUTANEOUS
  Administered 2014-06-17: 3 [IU] via SUBCUTANEOUS
  Administered 2014-06-18: 2 [IU] via SUBCUTANEOUS
  Administered 2014-06-18 – 2014-06-19 (×2): 3 [IU] via SUBCUTANEOUS

## 2014-06-14 NOTE — Progress Notes (Addendum)
[facility] PROGRESS NOTE  Alexander Pennington RUE:[id number] DOB: 09-14-1961 DOA: 06/13/2014 PCP: PROVIDER NOT IN SYSTEM  Brief summary  Alexander Pennington is a 53 y.o. male, type 2 diabetes mellitus not on insulin, Charcot feet, diabetic neuropathy, essential hypertension, stroke, sleep apnea on CPAP each bedtime, heart murmur, is free of complications with anesthesia, history of artificial hardware in his left foot, history of open sore on the plantar aspect of his right foot for the last few weeks, was fairly well until he started wearing a new shoe few weeks ago.  He developed a small blister on the side of his left big toe and for 2 days prior to admission he developed redness around his left foot with the open sore between the left first and second toe, fever, chills.  He also developed a sore on the plantar aspect of his right foot. In the past he has had some foot surgery done in [facility] by Dr. Ronda Fairly.  In the ER workup consistent with left foot cellulitis/abscess. Right foot plantar open sore with infection, orthopedics was consulted who requested hospitalist admission.  He has had complications with anesthesia previously so cardiology was consulted and he is going to undergo stress test prior to surgery.  Stress scheduled for 9/30.  Patient has poor insight into medical conditions and is very opinionated about care.  He is distrustful of the health care team and tries to negotiate his care.  Assessment/Plan  Left foot diabetic ulcer with cellulitis and abscess possible osteomyelitis, right foot plantar aspect open sore with possible infection and cellulitis -  Continue vanc and zosyn day 2 -  BCx NGTD -  Wound culture:  Mixed GP and GN bacteria -  Will notify Dr. Magnus Ivan that patient needs stress test prior to his surgery -  May need long course of antibiotics, potentially IV, depending on degree of debridement -  DVT prophylaxis per ortho -  IS -  Weight bearing status per ortho  Possible CAD  with suggestion of possible old posterior MI and reduced EF on ECHO 45-50% -  Appreciate cardiology assistance:  Stress test AM of 9/30 -  Tele:  NSR, no serious arrhythmias   Systolic heart failure with mildly reduced EF -  Started on BB (pt refused) -  Continue ARB -  Judicious use of IVF and consider diuretic  DM type II, uncontrolled with peripheral neuropathy, and patient refusing his insulin and diabetes medications.  Patient explained to me today that his normal blood sugar is usually in teh 150-180 range and that he has had episodes of hypoglycemia on insulin.  He feels that the doctors do not listen to him regarding his diabetes.  He understands that his diabetes is not well controlled, but does not seem to correlate slightly lower blood sugars with a decrease in A1c.  He does not trust insulin nor doctors to prescribe insulin.   He negotiates on all forms of diabetes management, refusing dose increases or refusing doses altogether.  It will be very difficult to control this gentleman's diabetes for this reason.   -  Hold Glucophage -  Continue Invocana -  Patient refusing most kinds of insulin but has agreed to NPH 6 units BID with low dose SSI but only if we check his CBG frequently -  ACHS and 3AM CBG -  A1c 8.2   Essential hypertension, blood pressures low normal -  Continue to offer Coreg  -  Continue reduced dose ARB  Diabetic neuropathy. Continue  Neurontin.   OSA - will bring home CPAP device.   Leukocytosis, resolving with abx  Mild normocytic anemia, may be secondary to zosyn, hemodilution, acute illness -  Repeat hgb in AB  Diet:  Diabetic  Access:  PIV IVF:  off Proph:  Refuses heparin and had misconceived notions about its indication:  He thought it was being used to prevent strokes and heart attacks.  He is afraid of being on any form of blood thinner because of risk of hemorrhagic stroke.  He is now amenable to heparin.    Code Status: full Family  Communication: patient alone Disposition Plan: pending stress test on Wed followed by cath if abnl or surgery on foot if normal   Consultants:  Cardiology, Drs. Turner and Brackbill  Ortho, Dr. Magnus Ivan  Procedures:  XR left foot:  Question infection with soft tissue swelling between the first and second toes with gas in teh soft tissues compatible with open wound or underlying abscess  CXR NAD  Antibiotics:  vanc 9/28 >>>  Zosyn 9/28 >>>   HPI/Subjective:  States his foot is about the same.  Had sweats yesterday which he attributed to his insulin.    Objective: Filed Vitals:   06/13/14 1849 06/13/14 2211 06/13/14 2222 06/14/14 0609  BP: 145/69  126/62 106/49  Pulse: 95 84 88 71  Temp: 99.6 F (37.6 C)  98.5 F (36.9 C) 98.3 F (36.8 C)  TempSrc: Oral  Oral Oral  Resp: 16 16 16 16   Height:      Weight:      SpO2: 97% 98% 97% 100%    Intake/Output Summary (Last 24 hours) at 06/14/14 1152 Last data filed at 06/14/14 1018  Gross per 24 hour  Intake   1515 ml  Output   1150 ml  Net    365 ml   Filed Weights   06/13/14 1315  Weight: 117.935 kg (260 lb)    Exam:   General:  Obese WM, No acute distress, somewhat pressured speech but interruptable.    HEENT:  NCAT, MMM  Cardiovascular:  RRR, nl S1, S2 no mrg, 2+ pulses, warm extremities  Respiratory:  CTAB, no increased WOB  Abdomen:   NABS, soft, NT/ND  MSK:   Normal tone and bulk, no LEE.  Left foot swollen, erythematous with purulent area between first and second toes on both the plantar and dorsal aspects of the foot.  No foul odor.    Neuro:  Grossly intact  Data Reviewed: Basic Metabolic Panel:  Recent Labs Lab 06/13/14 1330 06/14/14 0500  NA 135* 135*  K 4.4 3.9  CL 95* 99  CO2 25 25  GLUCOSE 307* 270*  BUN 16 14  CREATININE 0.55 0.57  CALCIUM 9.2 8.5   Liver Function Tests: No results found for this basename: AST, ALT, ALKPHOS, BILITOT, PROT, ALBUMIN,  in the last 168 hours No  results found for this basename: LIPASE, AMYLASE,  in the last 168 hours No results found for this basename: AMMONIA,  in the last 168 hours CBC:  Recent Labs Lab 06/13/14 1330 06/14/14 0500  WBC 15.4* 9.2  NEUTROABS 13.5*  --   HGB 13.9 12.4*  HCT 39.5 36.1*  MCV 84.8 86.4  PLT 174 PLATELET CLUMPS NOTED ON SMEAR, UNABLE TO ESTIMATE   Cardiac Enzymes: No results found for this basename: CKTOTAL, CKMB, CKMBINDEX, TROPONINI,  in the last 168 hours BNP (last 3 results) No results found for this basename: PROBNP,  in the last  8760 hours CBG:  Recent Labs Lab 06/13/14 1828 06/13/14 2210 06/14/14 0819  GLUCAP 260* 304* 246*    Recent Results (from the past 240 hour(s))  WOUND CULTURE     Status: None   Collection Time    06/13/14  1:15 PM      Result Value Ref Range Status   Specimen Description FOOT   Final   Special Requests Immunocompromised   Final   Gram Stain     Final   Value: NO WBC SEEN     RARE SQUAMOUS EPITHELIAL CELLS PRESENT     RARE GRAM NEGATIVE RODS     RARE GRAM POSITIVE COCCI     IN PAIRS   Culture     Final   Value: Culture reincubated for better growth     Performed at [facility]   Report Status PENDING   Incomplete  CULTURE, BLOOD (ROUTINE X 2)     Status: None   Collection Time    06/13/14  1:31 PM      Result Value Ref Range Status   Specimen Description BLOOD BLOOD RIGHT FOREARM   Final   Special Requests BOTTLES DRAWN AEROBIC AND ANAEROBIC 5 ML   Final   Culture  Setup Time     Final   Value: 06/13/2014 17:31     Performed at [facility]   Culture     Final   Value:        BLOOD CULTURE RECEIVED NO GROWTH TO DATE CULTURE WILL BE HELD FOR 5 DAYS BEFORE ISSUING A FINAL NEGATIVE REPORT     Performed at [facility]   Report Status PENDING   Incomplete  CULTURE, BLOOD (ROUTINE X 2)     Status: None   Collection Time    06/13/14  1:31 PM      Result Value Ref Range Status   Specimen Description BLOOD LEFT HAND    Final   Special Requests BOTTLES DRAWN AEROBIC AND ANAEROBIC 4 CC   Final   Culture  Setup Time     Final   Value: 06/13/2014 17:31     Performed at [facility]   Culture     Final   Value:        BLOOD CULTURE RECEIVED NO GROWTH TO DATE CULTURE WILL BE HELD FOR 5 DAYS BEFORE ISSUING A FINAL NEGATIVE REPORT     Performed at [facility]   Report Status PENDING   Incomplete  SURGICAL PCR SCREEN     Status: None   Collection Time    06/14/14  8:00 AM      Result Value Ref Range Status   MRSA, PCR NEGATIVE  NEGATIVE Final   Staphylococcus aureus NEGATIVE  NEGATIVE Final   Comment:            The Xpert SA Assay (FDA     approved for NASAL specimens     in patients over 31 years of age),     is one component of     a comprehensive surveillance     program.  Test performance has     been validated by [employer] for patients greater     than or equal to 67 year old.     It is not intended     to diagnose infection nor to     guide or monitor treatment.     Studies: Dg Chest [facility] [address]  06/13/2014   CLINICAL DATA:  Asthma. Heart murmur. Amed Datta of breath. Preoperative chest radiograph.  EXAM: PORTABLE CHEST - 1 VIEW  COMPARISON:  None.  FINDINGS: Cardiopericardial silhouette within normal limits. Mediastinal contours normal. Trachea midline. No airspace disease or effusion. Mild basilar atelectasis. Lung volumes are mildly reduced.  IMPRESSION: No active disease.   Electronically Signed   By: Andreas NewportGeoffrey  Lamke M.D.   On: 06/13/2014 16:18   Dg Foot Complete Left  06/13/2014   CLINICAL DATA:  Diabetes with left wound in the foot between the first and second toes.  EXAM: LEFT FOOT - COMPLETE 3+ VIEW  COMPARISON:  None.  FINDINGS: Three-view exam shows the patient to be status post fusion across the MTP joint of the great toe after resection of the metatarsal head. There has been resection of the base of the second and third toe proximal phalanges. Prominent soft tissue  swelling is seen in the soft tissues overlying the second metatarsal head. There is gas within the soft tissues which may be related to an open wound or abscess.  On the oblique view, there is some subtle lucency around the second most distal screw of the great toe fusion plate. While no gross bony destruction is evident, infection of the hardware cannot be excluded.  IMPRESSION: Status post first metatarsal head resection with fusion across the joint using a plate and screw fixation device. There is some lucency around the second most distal screw raising the question for infection. Substantial soft tissue swelling is seen between the first and second toes with gas in the soft tissues compatible with an open wound or underlying abscess.   Electronically Signed   By: Kennith CenterEric  Mansell M.D.   On: 06/13/2014 14:26    Scheduled Meds: . Canagliflozin  1 tablet Oral Daily  . carvedilol  3.125 mg Oral BID WC  . cholecalciferol  20,000 Units Oral Weekly  . gabapentin  1,200 mg Oral TID  . heparin  5,000 Units Subcutaneous 3 times per day  . insulin aspart  0-15 Units Subcutaneous TID WC  . insulin aspart  0-5 Units Subcutaneous QHS  . insulin glargine  10 Units Subcutaneous Daily  . piperacillin-tazobactam (ZOSYN)  IV  3.375 g Intravenous Q8H  . sodium chloride  3 mL Intravenous Q12H  . valsartan  320 mg Oral Daily  . vancomycin  1,000 mg Intravenous Q12H   Continuous Infusions: . sodium chloride 75 mL/hr at 06/14/14 16100742    Principal Problem:   Diabetic foot infection Active Problems:   Diabetes mellitus with foot ulcer   H/o Hemorrhagic stroke 2010   Hypertension   Sleep apnea   Cellulitis   Heart murmur   Dyslipidemia   Obesity   Preoperative clearance    Time spent: 30 min    Athelene Hursey, [facility] NeillsvilleMACKENZIE  [facility] Pager [phone]. If 7PM-7AM, please contact night-coverage at [email or web address], password [email or web address] 06/14/2014, 11:52 AM  LOS: 1 day            \

## 2014-06-14 NOTE — Progress Notes (Signed)
Patient Name: Alexander Pennington Date of Encounter: 06/14/2014     Principal Problem:   Diabetic foot infection Active Problems:   Diabetes mellitus with foot ulcer   H/o Hemorrhagic stroke 2010   Hypertension   Sleep apnea   Cellulitis   Heart murmur   Dyslipidemia   Obesity   Preoperative clearance    SUBJECTIVE  The patient denies any chest pain or shortness of breath. He did not get his nuclear stress test this morning.  We will reschedule the stress test for tomorrow.  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 Canagliflozin  1 tablet Oral Daily  . carvedilol  3.125 mg Oral BID WC  . cholecalciferol  20,000 Units Oral Weekly  . gabapentin  1,200 mg Oral TID  . heparin  5,000 Units Subcutaneous 3 times per day  . insulin aspart  0-15 Units Subcutaneous TID WC  . insulin aspart  0-5 Units Subcutaneous QHS  . insulin glargine  10 Units Subcutaneous Daily  . piperacillin-tazobactam (ZOSYN)  IV  3.375 g Intravenous Q8H  . sodium chloride  3 mL Intravenous Q12H  . valsartan  320 mg Oral Daily  . vancomycin  1,000 mg Intravenous Q12H    OBJECTIVE  Filed Vitals:   06/13/14 1849 06/13/14 2211 06/13/14 2222 06/14/14 0609  BP: 145/69  126/62 106/49  Pulse: 95 84 88 71  Temp: 99.6 F (37.6 C)  98.5 F (36.9 C) 98.3 F (36.8 C)  TempSrc: Oral  Oral Oral  Resp: 16 16 16 16   Height:      Weight:      SpO2: 97% 98% 97% 100%    Intake/Output Summary (Last 24 hours) at 06/14/14 1234 Last data filed at 06/14/14 1018  Gross per 24 hour  Intake   1515 ml  Output   1150 ml  Net    365 ml   Filed Weights   06/13/14 1315  Weight: 260 lb (117.935 kg)    PHYSICAL EXAM  General: Pleasant, NAD. Neuro: Alert and oriented X 3. Moves all extremities spontaneously. Psych: Normal affect. HEENT:  Normal  Neck: Supple without bruits or JVD. Lungs:  Resp regular and unlabored, CTA. Heart: RRR no s3, s4, and a there is a grade 1/6 systolic ejection  murmur at the base. Abdomen: Soft, non-tender, non-distended, BS + x 4.  Extremities: Foot ulcers left foot are covered with dressing.  Accessory Clinical Findings  CBC  Recent Labs  06/13/14 1330 06/14/14 0500  WBC 15.4* 9.2  NEUTROABS 13.5*  --   HGB 13.9 12.4*  HCT 39.5 36.1*  MCV 84.8 86.4  PLT 174 PLATELET CLUMPS NOTED ON SMEAR, UNABLE TO ESTIMATE   Basic Metabolic Panel  Recent Labs  06/13/14 1330 06/14/14 0500  NA 135* 135*  K 4.4 3.9  CL 95* 99  CO2 25 25  GLUCOSE 307* 270*  BUN 16 14  CREATININE 0.55 0.57  CALCIUM 9.2 8.5   Liver Function Tests No results found for this basename: AST, ALT, ALKPHOS, BILITOT, PROT, ALBUMIN,  in the last 72 hours No results found for this basename: LIPASE, AMYLASE,  in the last 72 hours Cardiac Enzymes No results found for this basename: CKTOTAL, CKMB, CKMBINDEX, TROPONINI,  in the last 72 hours BNP No components found with this basename: POCBNP,  D-Dimer No results found for this basename: DDIMER,  in the last 72 hours Hemoglobin A1C  Recent Labs  06/13/14 1856  HGBA1C 8.2*  Fasting Lipid Panel No results found for this basename: CHOL, HDL, LDLCALC, TRIG, CHOLHDL, LDLDIRECT,  in the last 72 hours Thyroid Function Tests No results found for this basename: TSH, T4TOTAL, FREET3, T3FREE, THYROIDAB,  in the last 72 hours  TELE  Normal sinus rhythm  ECG  Early R-wave transition.  Cannot exclude true posterior MI   Radiology/Studies  Dg Chest Port 1 View  06/13/2014   CLINICAL DATA:  Asthma. Heart murmur. Short of breath. Preoperative chest radiograph.  EXAM: PORTABLE CHEST - 1 VIEW  COMPARISON:  None.  FINDINGS: Cardiopericardial silhouette within normal limits. Mediastinal contours normal. Trachea midline. No airspace disease or effusion. Mild basilar atelectasis. Lung volumes are mildly reduced.  IMPRESSION: No active disease.   Electronically Signed   By: Andreas NewportGeoffrey  Lamke M.D.   On: 06/13/2014 16:18   Dg Foot  Complete Left  06/13/2014   CLINICAL DATA:  Diabetes with left wound in the foot between the first and second toes.  EXAM: LEFT FOOT - COMPLETE 3+ VIEW  COMPARISON:  None.  FINDINGS: Three-view exam shows the patient to be status post fusion across the MTP joint of the great toe after resection of the metatarsal head. There has been resection of the base of the second and third toe proximal phalanges. Prominent soft tissue swelling is seen in the soft tissues overlying the second metatarsal head. There is gas within the soft tissues which may be related to an open wound or abscess.  On the oblique view, there is some subtle lucency around the second most distal screw of the great toe fusion plate. While no gross bony destruction is evident, infection of the hardware cannot be excluded.  IMPRESSION: Status post first metatarsal head resection with fusion across the joint using a plate and screw fixation device. There is some lucency around the second most distal screw raising the question for infection. Substantial soft tissue swelling is seen between the first and second toes with gas in the soft tissues compatible with an open wound or underlying abscess.   Electronically Signed   By: Kennith CenterEric  Mansell M.D.   On: 06/13/2014 14:26    ASSESSMENT AND PLAN 1. Diabetic foot ulcers with cellulitis and possible osteomyelitis  2. H/o heart murmur, echo shows aortic valve thickening but no stenosis 3. HTN, controlled in ED  4. Diabetes mellitus, suspect uncontrolled  5. Obesity s/p lap band 2009, still with BMI 38.95 kg/(m^2).  6. OSA, compliant with CPAP  7. H/o hemorrhagic stroke 2010  8. Dyslipidemia, intolerant of statins 9. left ventricular systolic dysfunction with ejection fraction 45-50%  Plan: We will reschedule his Lexiscan Myoview stress test for tomorrow. Continue current medication.  No caffeine in diet.    Signed, Cassell Clementhomas Zackrey Dyar MD

## 2014-06-14 NOTE — Progress Notes (Signed)
Patient ID: Alexander EdwardsDavid Alverson, male   DOB: 12/19/1960, 53 y.o.   MRN: [id number] Surgery had to be canceled this evening per Cardiology's recs.  He needs further evaluation of his heart prior to proceeding to the OR.  His WBC peripherally has come down and his vitals are stable.  I placed new dressings on his feet.  Will continue to follow along until cleared for surgery.

## 2014-06-14 NOTE — Progress Notes (Signed)
Patient ID: Alexander EdwardsDavid Pennington, male   DOB: 07/26/1961, 53 y.o.   MRN: [id number] WBC improved on IV antibiotics.  He understands fully that we will proceed to the OR late today for an irrigation and debridement on wounds of both of his feet.

## 2014-06-14 NOTE — Progress Notes (Signed)
Pt comfortable placing himself on and off of CPAP QHS.  I witnessed the Pt demonstrate for myself (teach back method) how to place himself on the CPAP and cut the machine on.  Pt did this to my satisfaction and machine was plugged into red outlet.

## 2014-06-14 NOTE — Progress Notes (Signed)
Inpatient Diabetes Program Recommendations  AACE/ADA: New Consensus Statement on Inpatient Glycemic Control (2013)  Target Ranges:  Prepandial:   less than 140 mg/dL      Peak postprandial:   less than 180 mg/dL (1-2 hours)      Critically ill patients:  140 - 180 mg/dL     Results for Waynard EdwardsKING, Glennon (MRN [id number]) as of 06/14/2014 13:20  Ref. Range 06/14/2014 08:19 06/14/2014 12:19  Glucose-Capillary Latest Range: 70-99 mg/dL 409246 (H) 811220 (H)    Results for Waynard EdwardsKING, Lakeem (MRN [id number]) as of 06/14/2014 13:20  Ref. Range 06/13/2014 18:56  Hemoglobin A1C Latest Range: <5.7 % 8.2 (H)     Admitted with foot ulcer.  History of DM2, CVA, HTN.  Home DM Meds: Metformin ER 1000 mg bid + Invokana 300 mg daily  Current Meds: Lantus 10 units daily + Novolog Moderate SSI + Home Invokana    **Spoke to patient about his current A1c of 8.2%.  Explained what an A1c is and what it measures.  Reminded patient that his goal A1c is 7% or less per ADA standards to prevent both acute and long-term complications.  Patient informed me that he has had DM for a very long time and knows what to do to control his CBGs.  Patient stated to me that he has taken insulin before and that if we drop his blood sugars too low he will sweat and feel terrible.  Patient admitted to refusing Novolog last night and this morning for fear of hypoglycemia.  Patient stated he is happy with his current A1c of 8.2% and that it was >9% back in the summer due to many dietary indiscretions like sweet tea and ice cream.  Patient went on to tell me that he has not had his Metformin or his Invokana since Friday and that likely has caused his CBGs to become so elevated.  **Encouraged patient to manage his nutrition better.  Patient again stated he knows how to care for his DM and that it "was summer time and the living was easy".  Explained to patient why we are using insulin in the hospital to control his CBGs and relayed to patient that the MD  may likely restart his home oral DM medications once he is post-op.   Will follow Ambrose FinlandJeannine Johnston Kessler Solly RN, MSN, CDE Diabetes Coordinator Inpatient Diabetes Program Team Pager: [phone] (8a-10p)

## 2014-06-15 ENCOUNTER — Ambulatory Visit (HOSPITAL_COMMUNITY): Payer: BC Managed Care – PPO

## 2014-06-15 ENCOUNTER — Inpatient Hospital Stay (HOSPITAL_COMMUNITY): Payer: BC Managed Care – PPO

## 2014-06-15 DIAGNOSIS — Z01810 Encounter for preprocedural cardiovascular examination: Secondary | ICD-10-CM

## 2014-06-15 LAB — GLUCOSE, CAPILLARY
GLUCOSE-CAPILLARY: 126 mg/dL — AB (ref 70–99)
GLUCOSE-CAPILLARY: 132 mg/dL — AB (ref 70–99)
GLUCOSE-CAPILLARY: 103 mg/dL — AB (ref 70–99)
GLUCOSE-CAPILLARY: 132 mg/dL — AB (ref 70–99)
GLUCOSE-CAPILLARY: 144 mg/dL — AB (ref 70–99)
Glucose-Capillary: 126 mg/dL — ABNORMAL HIGH (ref 70–99)

## 2014-06-15 MED ORDER — REGADENOSON 0.4 MG/5ML IV SOLN: 0.4000 mg | Freq: Once | INTRAVENOUS | Status: DC

## 2014-06-15 MED ORDER — CELECOXIB 200 MG PO CAPS
400.0000 mg | ORAL_CAPSULE | Freq: Two times a day (BID) | ORAL | Status: DC
  Administered 2014-06-15 – 2014-06-19 (×6): 400 mg via ORAL
  Filled 2014-06-15 (×10): qty 2

## 2014-06-15 MED ORDER — REGADENOSON 0.4 MG/5ML IV SOLN
0.4000 mg | Freq: Once | INTRAVENOUS | Status: AC
  Administered 2014-06-15: 0.4 mg via INTRAVENOUS

## 2014-06-15 MED ORDER — TECHNETIUM TC 99M SESTAMIBI - CARDIOLITE
30.0000 | Freq: Once | INTRAVENOUS | Status: AC | PRN
  Administered 2014-06-15: 11:00:00 30 via INTRAVENOUS

## 2014-06-15 MED ORDER — TECHNETIUM TC 99M SESTAMIBI GENERIC - CARDIOLITE
10.0000 | Freq: Once | INTRAVENOUS | Status: AC | PRN
  Administered 2014-06-15: 10 via INTRAVENOUS

## 2014-06-15 MED ORDER — CELECOXIB 400 MG PO CAPS: 400.0000 mg | ORAL_CAPSULE | Freq: Two times a day (BID) | ORAL | Status: DC

## 2014-06-15 NOTE — Progress Notes (Signed)
Patient ID: Alexander Pennington, male   DOB: 05/19/1961, 53 y.o.   MRN: [id number]  [facility] PROGRESS NOTE  Alexander Pennington WUJ:[id number] DOB: [birth year] DOA: 06-21-2014 PCP: PROVIDER NOT IN SYSTEM  Brief narrative: 52 y.o. 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 He developed a small blister on the side of his left big toe and for 2 days prior to admission he developed redness around his left foot with the open sore between the left first and second toe. He also developed a sore on the plantar aspect of his right foot. In the past he has had some foot surgery done in [facility] by Dr. Ronda Fairly. In the ER workup consistent with left foot cellulitis/abscess. Right foot plantar open sore with infection, orthopedics was consulted who requested hospitalist admission. He has had complications with anesthesia previously so cardiology was consulted. Stress test done 9/30 and pt has been cleared for surgery.   Assessment/Plan  Left foot diabetic ulcer with cellulitis and abscess possible osteomyelitis, right foot plantar aspect open sore with possible infection and cellulitis  - Continue vanc and zosyn day 3 - BCx NGTD  - Wound culture: Mixed GP and GN bacteria  - surgery scheduled for Friday, pt cleared for surgery from cardiology standpoint  - May need long course of antibiotics, potentially IV, depending on degree of debridement  - DVT prophylaxis per ortho  - IS  Possible CAD with suggestion of possible old posterior MI and reduced EF on ECHO 45-50%  - Appreciate cardiology assistance:  - Tele: NSR, no serious arrhythmias - cleared for surgery   Systolic heart failure with mildly  reduced EF  - please note that pt reported intolerance to BB so this has not been provided  - Continue ARB  - Judicious use of IVF and consider diuretic  DM type II, uncontrolled with peripheral neuropathy - diet advanced - Continue Invocana  - ACHS and 3AM CBG  - A1c 8.2  Essential hypertension - reasonable inpatient control  - Continue Coreg  - Continue reduced dose ARB  Diabetic neuropathy - Continue Neurontin.  OSA - home CPAP device.  Leukocytosis - resolving with abx, WNL this AM  Mild normocytic anemia - slight drop in Hg since admission - repeat CBC in AM   DVT prophylaxis  Heparin SUB Q  Code Status: Full Family Communication: Pt at bedside Disposition Plan: Home when medically stable  IV Access:   Peripheral IV Procedures and diagnostic studies:   Dg Chest Port 1 View  06/21/14   No active disease.   Myoview stress test 9/30 --> negative for ischemia  Medical Consultants:   Cardiology  Ortho Other Consultants:   None   Anti-Infectives:   Vancomycin 06-22-23 --> Zosyn 06-22-2023 -->   Debbora Presto, MD  [facility] Pager [phone]  If 7PM-7AM, please contact night-coverage [email or web address] Password [email or web address] 06/15/2014, 2:54 PM   LOS: 2 days   HPI/Subjective: No events overnight.   Objective: Filed Vitals:   06/15/14 1043 06/15/14 1045 06/15/14 1047 06/15/14 1253  BP: 131/62 111/60 129/67 137/68  Pulse: 77 94 90 79  Temp:    98 F (36.7 C)  TempSrc:    Oral  Resp:    20  Height:      Weight:      SpO2:    99%    Intake/Output Summary (Last 24 hours) at 06/15/14 1454 Last data filed at 06/15/14 0900  Gross per 24 hour  Intake    860 ml  Output   1950 ml  Net  -1090 ml    Exam:   General:  Pt is alert, follows commands appropriately, not in acute distress  Cardiovascular: Regular rate and rhythm, no rubs, no gallops  Respiratory: Clear to auscultation bilaterally, no wheezing, no crackles, no rhonchi  Data Reviewed: Basic Metabolic  Panel:  Recent Labs Lab 06/13/14 1330 06/14/14 0500  NA 135* 135*  K 4.4 3.9  CL 95* 99  CO2 25 25  GLUCOSE 307* 270*  BUN 16 14  CREATININE 0.55 0.57  CALCIUM 9.2 8.5   CBC:  Recent Labs Lab 06/13/14 1330 06/14/14 0500  WBC 15.4* 9.2  NEUTROABS 13.5*  --   HGB 13.9 12.4*  HCT 39.5 36.1*  MCV 84.8 86.4  PLT 174  --   CBG:  Recent Labs Lab 06/14/14 2106 06/15/14 0305 06/15/14 0513 06/15/14 0746 06/15/14 1331  GLUCAP 172* 126* 126* 132* 103*    Recent Results (from the past 240 hour(s))  WOUND CULTURE     Status: None   Collection Time    06/13/14  1:15 PM      Result Value Ref Range Status   Specimen Description FOOT   Final   Special Requests Immunocompromised   Final   Gram Stain     Final   Value: NO WBC SEEN     RARE SQUAMOUS EPITHELIAL CELLS PRESENT     RARE GRAM NEGATIVE RODS     RARE GRAM POSITIVE COCCI     IN PAIRS   Culture     Final   Value: MODERATE GROUP B STREP(S.AGALACTIAE)ISOLATED     Performed at [facility]   Report Status PENDING   Incomplete  CULTURE, BLOOD (ROUTINE X 2)     Status: None   Collection Time    06/13/14  1:31 PM      Result Value Ref Range Status   Specimen Description BLOOD BLOOD RIGHT FOREARM   Final   Special Requests BOTTLES DRAWN AEROBIC AND ANAEROBIC 5 ML   Final   Culture  Setup Time     Final   Value: 06/13/2014 17:31     Performed at [facility]   Culture     Final   Value:        BLOOD CULTURE RECEIVED NO GROWTH TO DATE CULTURE WILL BE HELD FOR 5 DAYS BEFORE ISSUING A FINAL NEGATIVE REPORT     Performed at [facility]   Report Status PENDING   Incomplete  CULTURE, BLOOD (ROUTINE X 2)     Status: None   Collection Time    06/13/14  1:31 PM      Result Value Ref Range Status   Specimen Description BLOOD LEFT HAND   Final   Special Requests BOTTLES DRAWN AEROBIC AND ANAEROBIC 4 CC   Final   Culture  Setup Time     Final   Value: 06/13/2014 17:31     Performed at [facility]   Culture     Final   Value:        BLOOD CULTURE RECEIVED NO GROWTH TO DATE CULTURE WILL BE HELD FOR 5 DAYS BEFORE  ISSUING A FINAL NEGATIVE REPORT     Performed at [facility]   Report Status PENDING   Incomplete  SURGICAL PCR SCREEN     Status: None   Collection Time    06/14/14  8:00 AM      Result Value Ref Range Status   MRSA, PCR NEGATIVE  NEGATIVE Final   Staphylococcus aureus NEGATIVE  NEGATIVE Final   Comment:            The Xpert SA Assay (FDA     approved for NASAL specimens     in patients over 79 years of age),     is one component of     a comprehensive surveillance     program.  Test performance has     been validated by [employer] for patients greater     than or equal to 10 year old.     It is not intended     to diagnose infection nor to     guide or monitor treatment.     Scheduled Meds: . Canagliflozin  1 tablet Oral Daily  . carvedilol  3.125 mg Oral BID WC  . cholecalciferol  20,000 Units Oral Weekly  . gabapentin  1,200 mg Oral TID  . heparin subcutaneous  5,000 Units Subcutaneous 3 times per day  . insulin aspart  0-5 Units Subcutaneous QHS  . insulin aspart  0-9 Units Subcutaneous TID WC  . insulin NPH Human  6 Units Subcutaneous BID AC & HS  . piperacillin-tazobactam (ZOSYN)  IV  3.375 g Intravenous Q8H  . regadenoson  0.4 mg Intravenous Once  . sodium chloride  3 mL Intravenous Q12H  . traMADol  100 mg Oral TID  . valsartan  40 mg Oral Daily  . vancomycin  1,000 mg Intravenous Q12H   Continuous Infusions:

## 2014-06-15 NOTE — Progress Notes (Signed)
Agree. cdm 

## 2014-06-15 NOTE — Progress Notes (Signed)
Lexiscan myoview completed without complications. Mild chest pain resolved in recovery.  nuc results to follow.

## 2014-06-15 NOTE — Progress Notes (Signed)
Plugged into red outlet.

## 2014-06-15 NOTE — Progress Notes (Signed)
Subjective:  no chest pain, pt's test was delayed from yesterday.   Objective: Vital signs in last 24 hours: Temp:  [98.3 F (36.8 C)-101.3 F (38.5 C)] 98.3 F (36.8 C) (09/30 0525) Pulse Rate:  [74-90] 85 (09/30 1023) Resp:  [18-20] 18 (09/30 0525) BP: (102-129)/(40-68) 128/63 mmHg (09/30 1023) SpO2:  [97 %-99 %] 98 % (09/30 0525) Weight:  [260 lb 12.9 oz (118.3 kg)] 260 lb 12.9 oz (118.3 kg) (09/30 0525) Weight change: 12.9 oz (0.365 kg) Last BM Date: 06/15/14 Intake/Output from previous day: -900 09/29 0701 - 09/30 0700 In: 1750 [P.O.:600; I.V.:600; IV Piggyback:550] Out: 2650 [Urine:2650] Intake/Output this shift:    PE: General:Pleasant affect, NAD Skin:Warm and dry, brisk capillary refill HEENT:normocephalic, sclera clear, mucus membranes moist Heart:S1S2 RRR without murmur, gallup, rub or click Lungs:clear without rales, rhonchi, or wheezes WUJ:WJXB, non tender, + BS, do not palpate liver spleen or masses Ext:no lower ext edema, lt leg wrapped and warmer than rt., 2+ radial pulses Neuro:alert and oriented X 3, MAE, follows commands, + facial symmetry   Lab Results:  Recent Labs  06/13/14 1330 06/14/14 0500  WBC 15.4* 9.2  HGB 13.9 12.4*  HCT 39.5 36.1*  PLT 174 PLATELET CLUMPS NOTED ON SMEAR, UNABLE TO ESTIMATE   BMET  Recent Labs  06/13/14 1330 06/14/14 0500  NA 135* 135*  K 4.4 3.9  CL 95* 99  CO2 25 25  GLUCOSE 307* 270*  BUN 16 14  CREATININE 0.55 0.57  CALCIUM 9.2 8.5   No results found for this basename: TROPONINI, CK, MB,  in the last 72 hours  No results found for this basename: CHOL, HDL, LDLCALC, LDLDIRECT, TRIG, CHOLHDL   Lab Results  Component Value Date   HGBA1C 8.2* 06/13/2014     No results found for this basename: TSH     Studies/Results: Dg Chest Port 1 View  06/13/2014   CLINICAL DATA:  Asthma. Heart murmur. Short of breath. Preoperative chest radiograph.  EXAM: PORTABLE CHEST - 1 VIEW  COMPARISON:  None.   FINDINGS: Cardiopericardial silhouette within normal limits. Mediastinal contours normal. Trachea midline. No airspace disease or effusion. Mild basilar atelectasis. Lung volumes are mildly reduced.  IMPRESSION: No active disease.   Electronically Signed   By: Andreas Newport M.D.   On: 06/13/2014 16:18   Dg Foot Complete Left  06/13/2014   CLINICAL DATA:  Diabetes with left wound in the foot between the first and second toes.  EXAM: LEFT FOOT - COMPLETE 3+ VIEW  COMPARISON:  None.  FINDINGS: Three-view exam shows the patient to be status post fusion across the MTP joint of the great toe after resection of the metatarsal head. There has been resection of the base of the second and third toe proximal phalanges. Prominent soft tissue swelling is seen in the soft tissues overlying the second metatarsal head. There is gas within the soft tissues which may be related to an open wound or abscess.  On the oblique view, there is some subtle lucency around the second most distal screw of the great toe fusion plate. While no gross bony destruction is evident, infection of the hardware cannot be excluded.  IMPRESSION: Status post first metatarsal head resection with fusion across the joint using a plate and screw fixation device. There is some lucency around the second most distal screw raising the question for infection. Substantial soft tissue swelling is seen between the first and second toes with gas  in the soft tissues compatible with an open wound or underlying abscess.   Electronically Signed   By: Kennith CenterEric  Mansell M.D.   On: 06/13/2014 14:26    Medications: I have reviewed the patient's current medications. Scheduled Meds: . Canagliflozin  1 tablet Oral Daily  . carvedilol  3.125 mg Oral BID WC  . cholecalciferol  20,000 Units Oral Weekly  . gabapentin  1,200 mg Oral TID  . heparin subcutaneous  5,000 Units Subcutaneous 3 times per day  . insulin aspart  0-5 Units Subcutaneous QHS  . insulin aspart  0-9  Units Subcutaneous TID WC  . insulin NPH Human  6 Units Subcutaneous BID AC & HS  . piperacillin-tazobactam (ZOSYN)  IV  3.375 g Intravenous Q8H  . regadenoson  0.4 mg Intravenous Once  . sodium chloride  3 mL Intravenous Q12H  . traMADol  100 mg Oral TID  . valsartan  40 mg Oral Daily  . vancomycin  1,000 mg Intravenous Q12H   Continuous Infusions:  PRN Meds:.guaiFENesin-dextromethorphan, HYDROcodone-acetaminophen, metoprolol, morphine injection, polyethylene glycol  Assessment/Plan: 1. Diabetic foot ulcers with cellulitis and possible osteomyelitis  2. H/o heart murmur, echo shows aortic valve thickening but no stenosis EF 45-50% 3. HTN, controlled  4. Diabetes mellitus, suspect uncontrolled HgB A1C 8.2 5. Obesity s/p lap band 2009, still with BMI 38.95 kg/(m^2).  6. OSA, compliant with CPAP  7. H/o hemorrhagic stroke 2010  8. Dyslipidemia, intolerant of statins  9. left ventricular systolic dysfunction with ejection fraction 45-50%  Lexiscan myoview today, for surgical clearance.    LOS: 2 days   Time spent with pt. :15 minutes. Harmon HosptalNGOLD,LAURA R  Nurse Practitioner Certified Pager [phone] or after 5pm and on weekends call [phone] 06/15/2014, 10:35 AM   I have personally seen and examined this patient with Nada BoozerLaura Ingold, NP. I agree with the assessment and plan as outlined above. Stress test is pending today. If stress test is abnormal, this will be discussed with the patient in the morning and cardiac cath will be indicated. If stress test does not show ischemia, could proceed with planned surgical procedure.   MCALHANY,CHRISTOPHER 06/15/2014 12:24 PM

## 2014-06-16 ENCOUNTER — Encounter (HOSPITAL_COMMUNITY)
Admission: EM | Disposition: A | Discharge status: Home / Self Care | Payer: BC Managed Care – PPO | Source: Home / Self Care | Attending: Internal Medicine

## 2014-06-16 DIAGNOSIS — I10 Essential (primary) hypertension: Secondary | ICD-10-CM

## 2014-06-16 DIAGNOSIS — E785 Hyperlipidemia, unspecified: Secondary | ICD-10-CM

## 2014-06-16 DIAGNOSIS — I2510 Atherosclerotic heart disease of native coronary artery without angina pectoris: Secondary | ICD-10-CM

## 2014-06-16 HISTORY — PX: LEFT HEART CATHETERIZATION WITH CORONARY ANGIOGRAM: SHX5451

## 2014-06-16 HISTORY — DX: Atherosclerotic heart disease of native coronary artery without angina pectoris: I25.10

## 2014-06-16 LAB — PROTIME-INR
INR: 1.07 (ref 0.00–1.49)
Prothrombin Time: 13.9 seconds (ref 11.6–15.2)

## 2014-06-16 LAB — BASIC METABOLIC PANEL
Anion gap: 13 (ref 5–15)
BUN: 15 mg/dL (ref 6–23)
CO2: 26 mEq/L (ref 19–32)
Calcium: 8.8 mg/dL (ref 8.4–10.5)
Chloride: 100 mEq/L (ref 96–112)
Creatinine, Ser: 0.6 mg/dL (ref 0.50–1.35)
Glucose, Bld: 133 mg/dL — ABNORMAL HIGH (ref 70–99)
POTASSIUM: 4.2 meq/L (ref 3.7–5.3)
Sodium: 139 mEq/L (ref 137–147)

## 2014-06-16 LAB — CBC
HEMATOCRIT: 40.9 % (ref 39.0–52.0)
Hemoglobin: 14.2 g/dL (ref 13.0–17.0)
MCH: 29.3 pg (ref 26.0–34.0)
MCHC: 34.7 g/dL (ref 30.0–36.0)
MCV: 84.5 fL (ref 78.0–100.0)
PLATELETS: 188 10*3/uL (ref 150–400)
RBC: 4.84 MIL/uL (ref 4.22–5.81)
RDW: 12.7 % (ref 11.5–15.5)
WBC: 6.6 10*3/uL (ref 4.0–10.5)

## 2014-06-16 LAB — GLUCOSE, CAPILLARY
Glucose-Capillary: 122 mg/dL — ABNORMAL HIGH (ref 70–99)
Glucose-Capillary: 121 mg/dL — ABNORMAL HIGH (ref 70–99)
Glucose-Capillary: 188 mg/dL — ABNORMAL HIGH (ref 70–99)

## 2014-06-16 LAB — VANCOMYCIN, TROUGH: Vancomycin Tr: 8.7 ug/mL — ABNORMAL LOW (ref 10.0–20.0)

## 2014-06-16 SURGERY — LEFT HEART CATHETERIZATION WITH CORONARY ANGIOGRAM
Anesthesia: LOCAL

## 2014-06-16 MED ORDER — SODIUM CHLORIDE 0.9 % IV SOLN: INTRAVENOUS | Status: DC

## 2014-06-16 MED ORDER — SODIUM CHLORIDE 0.9 % IJ SOLN: 3.0000 mL | INTRAMUSCULAR | Status: DC | PRN

## 2014-06-16 MED ORDER — SODIUM CHLORIDE 0.9 % IJ SOLN
3.0000 mL | Freq: Two times a day (BID) | INTRAMUSCULAR | Status: DC
  Administered 2014-06-16 (×2): 3 mL via INTRAVENOUS

## 2014-06-16 MED ORDER — SODIUM CHLORIDE 0.9 % IV SOLN
250.0000 mL | INTRAVENOUS | Status: DC | PRN
  Administered 2014-06-17: 17:00:00 via INTRAVENOUS

## 2014-06-16 MED ORDER — SODIUM CHLORIDE 0.9 % IV SOLN: 250.0000 mL | INTRAVENOUS | Status: DC | PRN

## 2014-06-16 MED ORDER — VANCOMYCIN HCL 10 G IV SOLR
1500.0000 mg | Freq: Two times a day (BID) | INTRAVENOUS | Status: DC
  Administered 2014-06-16 – 2014-06-19 (×6): 1500 mg via INTRAVENOUS
  Filled 2014-06-16 (×7): qty 1500

## 2014-06-16 MED ORDER — SODIUM CHLORIDE 0.9 % IJ SOLN
3.0000 mL | INTRAMUSCULAR | Status: DC | PRN
Start: 2014-06-16 — End: 2014-06-19

## 2014-06-16 MED ORDER — ACETAMINOPHEN 325 MG PO TABS: 650.0000 mg | ORAL_TABLET | ORAL | Status: DC | PRN

## 2014-06-16 MED ORDER — SODIUM CHLORIDE 0.9 % IJ SOLN
3.0000 mL | Freq: Two times a day (BID) | INTRAMUSCULAR | Status: DC
  Administered 2014-06-17 – 2014-06-19 (×5): 3 mL via INTRAVENOUS

## 2014-06-16 MED ORDER — SODIUM CHLORIDE 0.9 % IV SOLN: 1.0000 mL/kg/h | INTRAVENOUS | Status: AC

## 2014-06-16 MED ORDER — ONDANSETRON HCL 4 MG/2ML IJ SOLN: 4.0000 mg | Freq: Four times a day (QID) | INTRAMUSCULAR | Status: DC | PRN

## 2014-06-16 MED ORDER — ASPIRIN 81 MG PO CHEW
81.0000 mg | CHEWABLE_TABLET | ORAL | Status: DC
  Filled 2014-06-16: qty 1

## 2014-06-16 NOTE — Progress Notes (Signed)
Pt able to void 300cc of yellow urine.  Dressing and support brace applied to wrist per pt request.  Radial care instructions given to pt and CG.  Pt and CG able to verbalize understanding .  Radial site remain at a level 0, and pt denies any pain at the site.  Pt returned to [facility] via [facility].  Pt alert and oriented X4.

## 2014-06-16 NOTE — Progress Notes (Signed)
ANTIBIOTIC CONSULT NOTE   Pharmacy Consult for Vancomycin and Zosyn Indication: Osteomyelitis, foot infection  Allergies  Allergen Reactions  . Tetanus Toxoids     "Sore arm all the way down, Painful, red"  . Ciprofloxacin Other (See Comments)    Altered mental status  . Eggs Or Egg-Derived Products Diarrhea    Constant flu after flu shot.  . Nsaids Other (See Comments)    Cannot take nsaids-  etc due to heart   . Statins Other (See Comments)    York Spaniel it causes pain?    Patient Measurements: Height: 5' 8.5" (174 cm) Weight: 260 lb 12.9 oz (118.3 kg) IBW/kg (Calculated) : 69.55  Vital Signs: Temp: 98.6 F (37 C) (10/01 0455) Temp src: Oral (10/01 0455) BP: 97/57 mmHg (10/01 0455) Pulse Rate: 76 (10/01 0455) Intake/Output from previous day: 09/30 0701 - 10/01 0700 In: 810 [P.O.:360; IV Piggyback:450] Out: 500 [Urine:500] Intake/Output from this shift:    Labs:  Recent Labs  06/13/14 1330 06/14/14 0500 06/16/14 0457  WBC 15.4* 9.2  --   HGB 13.9 12.4*  --   PLT 174 PLATELET CLUMPS NOTED ON SMEAR, UNABLE TO ESTIMATE  --   CREATININE 0.55 0.57 0.60   Estimated Creatinine Clearance: 131.5 ml/min (by C-G formula based on Cr of 0.6).  Recent Labs  06/16/14 0457  VANCOTROUGH 8.7*     Microbiology: Recent Results (from the past 720 hour(s))  WOUND CULTURE     Status: None   Collection Time    06/13/14  1:15 PM      Result Value Ref Range Status   Specimen Description FOOT   Final   Special Requests Immunocompromised   Final   Gram Stain     Final   Value: NO WBC SEEN     RARE SQUAMOUS EPITHELIAL CELLS PRESENT     RARE GRAM NEGATIVE RODS     RARE GRAM POSITIVE COCCI     IN PAIRS   Culture     Final   Value: MODERATE GROUP B STREP(S.AGALACTIAE)ISOLATED     Performed at [facility]   Report Status PENDING   Incomplete  CULTURE, BLOOD (ROUTINE X 2)     Status: None   Collection Time    06/13/14  1:31 PM      Result Value Ref Range Status   Specimen Description BLOOD BLOOD RIGHT FOREARM   Final   Special Requests BOTTLES DRAWN AEROBIC AND ANAEROBIC 5 ML   Final   Culture  Setup Time     Final   Value: 06/13/2014 17:31     Performed at [facility]   Culture     Final   Value:        BLOOD CULTURE RECEIVED NO GROWTH TO DATE CULTURE WILL BE HELD FOR 5 DAYS BEFORE ISSUING A FINAL NEGATIVE REPORT     Performed at [facility]   Report Status PENDING   Incomplete  CULTURE, BLOOD (ROUTINE X 2)     Status: None   Collection Time    06/13/14  1:31 PM      Result Value Ref Range Status   Specimen Description BLOOD LEFT HAND   Final   Special Requests BOTTLES DRAWN AEROBIC AND ANAEROBIC 4 CC   Final   Culture  Setup Time     Final   Value: 06/13/2014 17:31     Performed at [facility]   Culture     Final   Value:  BLOOD CULTURE RECEIVED NO GROWTH TO DATE CULTURE WILL BE HELD FOR 5 DAYS BEFORE ISSUING A FINAL NEGATIVE REPORT     Performed at [facility]   Report Status PENDING   Incomplete  SURGICAL PCR SCREEN     Status: None   Collection Time    06/14/14  8:00 AM      Result Value Ref Range Status   MRSA, PCR NEGATIVE  NEGATIVE Final   Staphylococcus aureus NEGATIVE  NEGATIVE Final   Comment:            The Xpert SA Assay (FDA     approved for NASAL specimens     in patients over 27 years of age),     is one component of     a comprehensive surveillance     program.  Test performance has     been validated by [employer] for patients greater     than or equal to 50 year old.     It is not intended     to diagnose infection nor to     guide or monitor treatment.    Medical History: Past Medical History  Diagnosis Date  . Diabetes mellitus   . Hemorrhagic stroke     a. Dx 2010. 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 Unclear etiology.  . Hypertension   . Sleep apnea   . Neuropathy   . Diabetic Charcot foot   . Murmur, heart Fall 2014 - diagnosed  . Complication of  anesthesia     hx deviated septum, sleep apnea- has had problems  . Obesity     a. hx of lap band surgery.    Medications:  Anti-infectives   Start     Dose/Rate Route Frequency Ordered Stop   06/14/14 0600  vancomycin (VANCOCIN) IVPB 1000 mg/200 mL premix     1,000 mg 200 mL/hr over 60 Minutes Intravenous Every 12 hours 06/13/14 1819     06/14/14 0000  piperacillin-tazobactam (ZOSYN) IVPB 3.375 g     3.375 g 12.5 mL/hr over 240 Minutes Intravenous Every 8 hours 06/13/14 1819     06/13/14 1830  vancomycin (VANCOCIN) 1,500 mg in sodium chloride 0.9 % 500 mL IVPB     1,500 mg 250 mL/hr over 120 Minutes Intravenous  Once 06/13/14 1819 06/14/14 0105   06/13/14 1315  piperacillin-tazobactam (ZOSYN) IVPB 3.375 g     3.375 g 100 mL/hr over 30 Minutes Intravenous  Once 06/13/14 1314 06/13/14 1634   06/13/14 1315  vancomycin (VANCOCIN) IVPB 1000 mg/200 mL premix     1,000 mg 200 mL/hr over 60 Minutes Intravenous  Once 06/13/14 1314 06/13/14 1530     Assessment: 53 yoM [facility] with acute left foot infection and chronic right foot wound. PMH includes DM with chronic charcot changes in right foot; pt states MRI at [facility] was negative for infection. He is now seeking care for left foot pain, redness, drainage. First doses and Vancomycin and Zosyn were given in ED. Pharmacy is consulted to dose Vancomycin and Zosyn. Awaiting cardiology clearance to proceed with surgery  9/28 >> Vanc >> 9/28 >> Zosyn >>   Tmax: afeb WBCs: now WNL Renal: SCr WNL, normalized CrCl > 179ml/min  9/28 blood x2: NGTD 9/28 wound: Moderate group B strep  Drug level / dose changes info: 10/1 VT = 8.7 mcg/ml on 1gm IV q12h (prior to 6th dose), dose increased to 1500mg  IV q12h  Goal of Therapy:  Vancomycin trough level 15-20 mcg/ml Appropriate abx dosing, eradication of infection.   Plan:  Day #4 antibiotics  Continue Zosyn 3.375g IV Q8H infused over 4hrs.   Vancomycin trough low this  morning, increase to 1500mg  IV q12h  Recheck trough at new steady state  Hold off on q8h dosing d/t obesity and fear of accumulation and risk of nephrotoxicity  Juliette Alcideustin Jeannetta Cerutti, PharmD, BCPS.   Pager: [phone] 06/16/2014 8:12 AM

## 2014-06-16 NOTE — Interval H&P Note (Signed)
Cath Lab Visit (complete for each Cath Lab visit)  Clinical Evaluation Leading to the Procedure:   ACS: No.  Non-ACS:    Anginal Classification: No Symptoms  Anti-ischemic medical therapy: Minimal Therapy (1 class of medications)  Non-Invasive Test Results: Intermediate-risk stress test findings: cardiac mortality 1-3%/year  Prior CABG: No previous CABG      History and Physical Interval Note:  06/16/2014 4:34 PM  Alexander Pennington  has presented today for surgery, with the diagnosis of pre op surgery/positive nuc  The various methods of treatment have been discussed with the patient and family. After consideration of risks, benefits and other options for treatment, the patient has consented to  Procedure(s): LEFT HEART CATHETERIZATION WITH CORONARY ANGIOGRAM (N/A) as a surgical intervention .  The patient's history has been reviewed, patient examined, no change in status, stable for surgery.  I have reviewed the patient's chart and labs.  Questions were answered to the patient's satisfaction.     Alexander Pennington

## 2014-06-16 NOTE — Progress Notes (Signed)
Pt received form cath procedure with a TR Band for deflation.  No complaints at this time

## 2014-06-16 NOTE — Progress Notes (Signed)
Patient ID: Alexander Pennington, male   DOB: 1961-02-10, 53 y.o.   MRN: [id number] [facility] PROGRESS NOTE  Alexander Pennington WUJ:[id number] DOB: December 22, 1960 DOA: 06/13/2014 PCP: PROVIDER NOT IN SYSTEM  Brief narrative:  53 y.o. 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 He developed a small blister on the side of his left big toe and for 2 days prior to admission he developed redness around his left foot with the open sore between the left first and second toe. He also developed a sore on the plantar aspect of his right foot. In the past he has had some foot surgery done in [facility] by Dr. Ronda Fairly. In the ER workup consistent with left foot cellulitis/abscess. Right foot plantar open sore with infection, orthopedics was consulted who requested hospitalist admission. He has had complications with anesthesia previously so cardiology was consulted. Stress test done 9/30 and pt has been cleared for surgery.   Assessment/Plan  Left foot diabetic ulcer with cellulitis and abscess possible osteomyelitis, right foot plantar aspect open sore with possible infection and cellulitis  - Continue vanc and zosyn day 4 - BCx NGTD  - Wound culture: Mixed GP and GN bacteria  - surgery scheduled for Friday, pt cleared for surgery from cardiology standpoint  - May need long course of antibiotics, potentially IV, depending on degree of debridement  - DVT prophylaxis per ortho  - IS  Possible CAD with suggestion of possible old posterior MI and reduced EF on ECHO 45-50%  - Appreciate cardiology assistance:  - Tele: NSR, no serious arrhythmias  - cardiac cath today  Systolic heart failure with mildly  reduced EF  - please note that pt reported intolerance to BB so this has not been provided  - Continue ARB and follow up on cardiac cath procedure  DM type II, uncontrolled with peripheral neuropathy  - diet advanced  - Continue Invocana  - ACHS and 3AM CBG  - A1c 8.2  Essential hypertension  - reasonable inpatient control  - Continue Coreg  - Continue reduced dose ARB  Diabetic neuropathy  - Continue Neurontin.  OSA  - home CPAP device.  Leukocytosis  - resolving with abx, WNL this AM  Mild normocytic anemia  - slight drop in Hg since admission  - repeat CBC in AM   DVT prophylaxis  Heparin SUB Q  Code Status: Full  Family Communication: Pt at bedside  Disposition Plan: Home when medically stable   IV Access:   Peripheral IV Procedures and diagnostic studies:   Dg Chest Port 1 View 06/13/2014 No active disease.  Myoview stress test 9/30 --> negative for ischemia  Medical Consultants:   Cardiology  Ortho Other Consultants:   None  Anti-Infectives:   Vancomycin 9/28 -->  Zosyn 9/28 -->   Debbora Presto, MD  [facility] Pager [phone]  If 7PM-7AM, please contact night-coverage [email or web address] Password [email or web address] 06/16/2014, 1:00 PM   LOS: 3 days   HPI/Subjective: No events overnight.   Objective: Filed Vitals:   06/15/14 1047 06/15/14 1253 06/15/14 2118 06/16/14 0455  BP: 129/67 137/68 127/68 97/57  Pulse: 90 79 70 76  Temp:  98 F (36.7 C) 98.3 F (36.8  C) 98.6 F (37 C)  TempSrc:  Oral Oral Oral  Resp:  20 16 17   Height:      Weight:      SpO2:  99% 97% 99%    Intake/Output Summary (Last 24 hours) at 06/16/14 1300 Last data filed at 06/16/14 0900  Gross per 24 hour  Intake    810 ml  Output    500 ml  Net    310 ml    Exam:   General:  Pt is alert, follows commands appropriately, not in acute distress  Cardiovascular: Regular rate and rhythm, no rubs, no gallops  Respiratory: Clear to auscultation bilaterally, no wheezing, no crackles, no  rhonchi  Abdomen: Soft, non tender, non distended, bowel sounds present, no guarding  Data Reviewed: Basic Metabolic Panel:  Recent Labs Lab 06/13/14 1330 06/14/14 0500 06/16/14 0457  NA 135* 135* 139  K 4.4 3.9 4.2  CL 95* 99 100  CO2 25 25 26   GLUCOSE 307* 270* 133*  BUN 16 14 15   CREATININE 0.55 0.57 0.60  CALCIUM 9.2 8.5 8.8   CBC:  Recent Labs Lab 06/13/14 1330 06/14/14 0500 06/16/14 0945  WBC 15.4* 9.2 6.6  NEUTROABS 13.5*  --   --   HGB 13.9 12.4* 14.2  HCT 39.5 36.1* 40.9  MCV 84.8 86.4 84.5  PLT 174 PLATELET CLUMPS NOTED ON SMEAR, UNABLE TO ESTIMATE 188   CBG:  Recent Labs Lab 06/15/14 0746 06/15/14 1331 06/15/14 1833 06/15/14 2117 06/16/14 0500  GLUCAP 132* 103* 132* 144* 122*    Recent Results (from the past 240 hour(s))  WOUND CULTURE     Status: None   Collection Time    06/13/14  1:15 PM      Result Value Ref Range Status   Specimen Description FOOT   Final   Special Requests Immunocompromised   Final   Gram Stain     Final   Value: NO WBC SEEN     RARE SQUAMOUS EPITHELIAL CELLS PRESENT     RARE GRAM NEGATIVE RODS     RARE GRAM POSITIVE COCCI     IN PAIRS   Culture     Final   Value: MODERATE GROUP B STREP(S.AGALACTIAE)ISOLATED     Performed at [facility]   Report Status PENDING   Incomplete  CULTURE, BLOOD (ROUTINE X 2)     Status: None   Collection Time    06/13/14  1:31 PM      Result Value Ref Range Status   Specimen Description BLOOD BLOOD RIGHT FOREARM   Final   Special Requests BOTTLES DRAWN AEROBIC AND ANAEROBIC 5 ML   Final   Culture  Setup Time     Final   Value: 06/13/2014 17:31     Performed at [facility]   Culture     Final   Value:        BLOOD CULTURE RECEIVED NO GROWTH TO DATE CULTURE WILL BE HELD FOR 5 DAYS BEFORE ISSUING A FINAL NEGATIVE REPORT     Performed at [facility]   Report Status PENDING   Incomplete  CULTURE, BLOOD (ROUTINE X 2)     Status: None   Collection Time     06/13/14  1:31 PM      Result Value Ref Range Status   Specimen Description BLOOD LEFT HAND   Final   Special Requests BOTTLES DRAWN AEROBIC AND ANAEROBIC 4 CC   Final   Culture  Setup  Time     Final   Value: 06/13/2014 17:31     Performed at [facility]   Culture     Final   Value:        BLOOD CULTURE RECEIVED NO GROWTH TO DATE CULTURE WILL BE HELD FOR 5 DAYS BEFORE ISSUING A FINAL NEGATIVE REPORT     Performed at [facility]   Report Status PENDING   Incomplete  SURGICAL PCR SCREEN     Status: None   Collection Time    06/14/14  8:00 AM      Result Value Ref Range Status   MRSA, PCR NEGATIVE  NEGATIVE Final   Staphylococcus aureus NEGATIVE  NEGATIVE Final   Comment:            The Xpert SA Assay (FDA     approved for NASAL specimens     in patients over 55 years of age),     is one component of     a comprehensive surveillance     program.  Test performance has     been validated by [facility] for patients greater     than or equal to 55 year old.     It is not intended     to diagnose infection nor to     guide or monitor treatment.     Scheduled Meds: . [START ON 06/17/2014] aspirin  81 mg Oral Pre-Cath  . Canagliflozin  1 tablet Oral Daily  . carvedilol  3.125 mg Oral BID WC  . celecoxib  400 mg Oral BID  . cholecalciferol  20,000 Units Oral Weekly  . gabapentin  1,200 mg Oral TID  . heparin subcutaneous  5,000 Units Subcutaneous 3 times per day  . insulin aspart  0-5 Units Subcutaneous QHS  . insulin aspart  0-9 Units Subcutaneous TID WC  . insulin NPH Human  6 Units Subcutaneous BID AC & HS  . piperacillin-tazobactam (ZOSYN)  IV  3.375 g Intravenous Q8H  . regadenoson  0.4 mg Intravenous Once  . sodium chloride  3 mL Intravenous Q12H  . sodium chloride  3 mL Intravenous Q12H  . traMADol  100 mg Oral TID  . valsartan  40 mg Oral Daily  . vancomycin  1,500 mg Intravenous Q12H   Continuous Infusions: . sodium chloride

## 2014-06-16 NOTE — H&P (View-Only) (Signed)
Subjective:  No CP/SOB  Objective:  Temp:  [98 F (36.7 C)-98.6 F (37 C)] 98.6 F (37 C) (10/01 0455) Pulse Rate:  [70-94] 76 (10/01 0455) Resp:  [16-20] 17 (10/01 0455) BP: (97-137)/(57-68) 97/57 mmHg (10/01 0455) SpO2:  [97 %-99 %] 99 % (10/01 0455) Weight change:   Intake/Output from previous day: 09/30 0701 - 10/01 0700 In: 810 [P.O.:360; IV Piggyback:450] Out: 500 [Urine:500]  Intake/Output from this shift:    Physical Exam: General appearance: alert and no distress Neck: no adenopathy, no JVD, supple, symmetrical, trachea midline, thyroid not enlarged, symmetric, no tenderness/mass/nodules and Soft left carotid bruit vs transmitted murmur Lungs: clear to auscultation bilaterally Heart: soft outflow tract murmur Extremities: extremities normal, atraumatic, no cyanosis or edema  Lab Results: Results for orders placed during the hospital encounter of 06/13/14 (from the past 48 hour(s))  SURGICAL PCR SCREEN     Status: None   Collection Time    06/14/14  8:00 AM      Result Value Ref Range   MRSA, PCR NEGATIVE  NEGATIVE   Staphylococcus aureus NEGATIVE  NEGATIVE   Comment:            The Xpert SA Assay (FDA     approved for NASAL specimens     in patients over 39 years of age),     is one component of     a comprehensive surveillance     program.  Test performance has     been validated by [employer] for patients greater     than or equal to 44 year old.     It is not intended     to diagnose infection nor to     guide or monitor treatment.  GLUCOSE, CAPILLARY     Status: Abnormal   Collection Time    06/14/14  8:19 AM      Result Value Ref Range   Glucose-Capillary 246 (*) 70 - 99 mg/dL  GLUCOSE, CAPILLARY     Status: Abnormal   Collection Time    06/14/14 12:19 PM      Result Value Ref Range   Glucose-Capillary 220 (*) 70 - 99 mg/dL  GLUCOSE, CAPILLARY     Status: Abnormal   Collection Time    06/14/14  5:22 PM      Result Value Ref  Range   Glucose-Capillary 171 (*) 70 - 99 mg/dL  GLUCOSE, CAPILLARY     Status: Abnormal   Collection Time    06/14/14  6:42 PM      Result Value Ref Range   Glucose-Capillary 159 (*) 70 - 99 mg/dL  GLUCOSE, CAPILLARY     Status: Abnormal   Collection Time    06/14/14  9:06 PM      Result Value Ref Range   Glucose-Capillary 172 (*) 70 - 99 mg/dL  GLUCOSE, CAPILLARY     Status: Abnormal   Collection Time    06/15/14  3:05 AM      Result Value Ref Range   Glucose-Capillary 126 (*) 70 - 99 mg/dL  GLUCOSE, CAPILLARY     Status: Abnormal   Collection Time    06/15/14  5:13 AM      Result Value Ref Range   Glucose-Capillary 126 (*) 70 - 99 mg/dL  GLUCOSE, CAPILLARY     Status: Abnormal   Collection Time    06/15/14  7:46 AM      Result Value  Ref Range   Glucose-Capillary 132 (*) 70 - 99 mg/dL  GLUCOSE, CAPILLARY     Status: Abnormal   Collection Time    06/15/14  1:31 PM      Result Value Ref Range   Glucose-Capillary 103 (*) 70 - 99 mg/dL  GLUCOSE, CAPILLARY     Status: Abnormal   Collection Time    06/15/14  6:33 PM      Result Value Ref Range   Glucose-Capillary 132 (*) 70 - 99 mg/dL  GLUCOSE, CAPILLARY     Status: Abnormal   Collection Time    06/15/14  9:17 PM      Result Value Ref Range   Glucose-Capillary 144 (*) 70 - 99 mg/dL  VANCOMYCIN, TROUGH     Status: Abnormal   Collection Time    06/16/14  4:57 AM      Result Value Ref Range   Vancomycin Tr 8.7 (*) 10.0 - 20.0 ug/mL  BASIC METABOLIC PANEL     Status: Abnormal   Collection Time    06/16/14  4:57 AM      Result Value Ref Range   Sodium 139  137 - 147 mEq/L   Potassium 4.2  3.7 - 5.3 mEq/L   Chloride 100  96 - 112 mEq/L   CO2 26  19 - 32 mEq/L   Glucose, Bld 133 (*) 70 - 99 mg/dL   BUN 15  6 - 23 mg/dL   Creatinine, Ser 0.60  0.50 - 1.35 mg/dL   Calcium 8.8  8.4 - 10.5 mg/dL   GFR calc non Af Amer >90  >90 mL/min   GFR calc Af Amer >90  >90 mL/min   Comment: (NOTE)     The eGFR has been calculated  using the CKD EPI equation.     This calculation has not been validated in all clinical situations.     eGFR's persistently <90 mL/min signify possible Chronic Kidney     Disease.   Anion gap 13  5 - 15  GLUCOSE, CAPILLARY     Status: Abnormal   Collection Time    06/16/14  5:00 AM      Result Value Ref Range   Glucose-Capillary 122 (*) 70 - 99 mg/dL    Imaging: Imaging results have been reviewed  Assessment/Plan:   1. Principal Problem: 2.   Diabetic foot infection 3. Active Problems: 4.   Diabetes mellitus with foot ulcer 5.   H/o Hemorrhagic stroke 2010 6.   Hypertension 7.   Sleep apnea 8.   Cellulitis 9.   Heart murmur 10.   Dyslipidemia 11.   Obesity 12.   Preoperative clearance 13.   Time Spent Directly with Patient:  45 minutes  Length of Stay:  LOS: 3 days   Pt with + CRF seen for pre op clearance. 2D shows mild LV dysfunction and myoview intermediate risk with ischemic abnormalities in LAD/LCX territories. I prefer defining his anatomy with cath to risk stratify him prior to surgery. I have discussed this with Mr Weinert and he agrees to proceed. Exam benign. Labs OK.   Kemonie Cutillo J 06/16/2014, 7:59 AM

## 2014-06-16 NOTE — CV Procedure (Signed)
    Cardiac Catheterization Procedure Note  Name: Alexander EdwardsDavid Pennington MRN: [id number] DOB: 04/27/1961  Procedure: Left Heart Cath, Selective Coronary Angiography, LV angiography  Indication: Abnormal Myoview scan.   Procedural Details: The right wrist was prepped, draped, and anesthetized with 1% lidocaine. Using the modified Seldinger technique, a 5/6 French Slender sheath was introduced into the right radial artery. 3 mg of verapamil was administered through the sheath, weight-based unfractionated heparin was administered intravenously. Standard Judkins catheters were used for selective coronary angiography and left ventriculography. Catheter exchanges were performed over an exchange length guidewire. There were no immediate procedural complications. A TR band was used for radial hemostasis at the completion of the procedure.  The patient was transferred to the post catheterization recovery area for further monitoring.  Procedural Findings: Hemodynamics: AO 120/65 LV 124/17  Coronary angiography: Coronary dominance: right  Left mainstem: The left main is patent. There is no obstructive disease.  Left anterior descending (LAD): The LAD is heavily calcified. The vessel is widely patent throughout. The first diagonal is large in caliber without significant stenosis. There are diffuse irregularities but no significant stenoses identified throughout the LAD. The large diagonal branch has a 30-40% proximal/ostial stenosis  Left circumflex (LCx): Circumflex is patent. The first obtuse marginal is large in caliber and distribution. The proximal OM branch has 75% stenosis. The AV circumflex just beyond the OM origin has 70% focal stenosis. The proximal circumflex just before the bifurcation of the first OM/AV circumflex has 30-40% stenosis.  Right coronary artery (RCA): The RCA is dominant. The mid vessel has 40-50% stenosis. The proximal vessel is widely patent. There is moderate diffuse calcification. The  distal vessel has diffuse 60% stenosis leading into the PDA branch.  Left ventriculography: Left ventricular systolic function is vigorous, LVEF is estimated at 65 to 70 %, there is no significant mitral regurgitation   Estimated Blood Loss: Minimal  Final Conclusions:   1. Moderately severe left circumflex stenosis 2. Nonobstructive RCA stenosis 3. Widely patent LAD and left main 4. Normal LV systolic function  Recommendations: In this patient who has no angina, I would recommend medical therapy. He does not have high risk coronary anatomy and I think the best course of action is to proceed with surgery without any intervention prior to surgery. PCI could be considered if he develops ischemic symptoms.  Alexander BollmanMichael Keynan Heffern MD, [facility] 06/16/2014, 5:05 PM

## 2014-06-16 NOTE — Progress Notes (Signed)
Subjective:  No CP/SOB  Objective:  Temp:  [98 F (36.7 C)-98.6 F (37 C)] 98.6 F (37 C) (10/01 0455) Pulse Rate:  [70-94] 76 (10/01 0455) Resp:  [16-20] 17 (10/01 0455) BP: (97-137)/(57-68) 97/57 mmHg (10/01 0455) SpO2:  [97 %-99 %] 99 % (10/01 0455) Weight change:   Intake/Output from previous day: 09/30 0701 - 10/01 0700 In: 810 [P.O.:360; IV Piggyback:450] Out: 500 [Urine:500]  Intake/Output from this shift:    Physical Exam: General appearance: alert and no distress Neck: no adenopathy, no JVD, supple, symmetrical, trachea midline, thyroid not enlarged, symmetric, no tenderness/mass/nodules and Soft left carotid bruit vs transmitted murmur Lungs: clear to auscultation bilaterally Heart: soft outflow tract murmur Extremities: extremities normal, atraumatic, no cyanosis or edema  Lab Results: Results for orders placed during the hospital encounter of 06/13/14 (from the past 48 hour(s))  SURGICAL PCR SCREEN     Status: None   Collection Time    06/14/14  8:00 AM      Result Value Ref Range   MRSA, PCR NEGATIVE  NEGATIVE   Staphylococcus aureus NEGATIVE  NEGATIVE   Comment:            The Xpert SA Assay (FDA     approved for NASAL specimens     in patients over 70 years of age),     is one component of     a comprehensive surveillance     program.  Test performance has     been validated by [employer] for patients greater     than or equal to 14 year old.     It is not intended     to diagnose infection nor to     guide or monitor treatment.  GLUCOSE, CAPILLARY     Status: Abnormal   Collection Time    06/14/14  8:19 AM      Result Value Ref Range   Glucose-Capillary 246 (*) 70 - 99 mg/dL  GLUCOSE, CAPILLARY     Status: Abnormal   Collection Time    06/14/14 12:19 PM      Result Value Ref Range   Glucose-Capillary 220 (*) 70 - 99 mg/dL  GLUCOSE, CAPILLARY     Status: Abnormal   Collection Time    06/14/14  5:22 PM      Result Value Ref  Range   Glucose-Capillary 171 (*) 70 - 99 mg/dL  GLUCOSE, CAPILLARY     Status: Abnormal   Collection Time    06/14/14  6:42 PM      Result Value Ref Range   Glucose-Capillary 159 (*) 70 - 99 mg/dL  GLUCOSE, CAPILLARY     Status: Abnormal   Collection Time    06/14/14  9:06 PM      Result Value Ref Range   Glucose-Capillary 172 (*) 70 - 99 mg/dL  GLUCOSE, CAPILLARY     Status: Abnormal   Collection Time    06/15/14  3:05 AM      Result Value Ref Range   Glucose-Capillary 126 (*) 70 - 99 mg/dL  GLUCOSE, CAPILLARY     Status: Abnormal   Collection Time    06/15/14  5:13 AM      Result Value Ref Range   Glucose-Capillary 126 (*) 70 - 99 mg/dL  GLUCOSE, CAPILLARY     Status: Abnormal   Collection Time    06/15/14  7:46 AM      Result Value  Ref Range   Glucose-Capillary 132 (*) 70 - 99 mg/dL  GLUCOSE, CAPILLARY     Status: Abnormal   Collection Time    06/15/14  1:31 PM      Result Value Ref Range   Glucose-Capillary 103 (*) 70 - 99 mg/dL  GLUCOSE, CAPILLARY     Status: Abnormal   Collection Time    06/15/14  6:33 PM      Result Value Ref Range   Glucose-Capillary 132 (*) 70 - 99 mg/dL  GLUCOSE, CAPILLARY     Status: Abnormal   Collection Time    06/15/14  9:17 PM      Result Value Ref Range   Glucose-Capillary 144 (*) 70 - 99 mg/dL  VANCOMYCIN, TROUGH     Status: Abnormal   Collection Time    06/16/14  4:57 AM      Result Value Ref Range   Vancomycin Tr 8.7 (*) 10.0 - 20.0 ug/mL  BASIC METABOLIC PANEL     Status: Abnormal   Collection Time    06/16/14  4:57 AM      Result Value Ref Range   Sodium 139  137 - 147 mEq/L   Potassium 4.2  3.7 - 5.3 mEq/L   Chloride 100  96 - 112 mEq/L   CO2 26  19 - 32 mEq/L   Glucose, Bld 133 (*) 70 - 99 mg/dL   BUN 15  6 - 23 mg/dL   Creatinine, Ser 0.60  0.50 - 1.35 mg/dL   Calcium 8.8  8.4 - 10.5 mg/dL   GFR calc non Af Amer >90  >90 mL/min   GFR calc Af Amer >90  >90 mL/min   Comment: (NOTE)     The eGFR has been calculated  using the CKD EPI equation.     This calculation has not been validated in all clinical situations.     eGFR's persistently <90 mL/min signify possible Chronic Kidney     Disease.   Anion gap 13  5 - 15  GLUCOSE, CAPILLARY     Status: Abnormal   Collection Time    06/16/14  5:00 AM      Result Value Ref Range   Glucose-Capillary 122 (*) 70 - 99 mg/dL    Imaging: Imaging results have been reviewed  Assessment/Plan:   1. Principal Problem: 2.   Diabetic foot infection 3. Active Problems: 4.   Diabetes mellitus with foot ulcer 5.   H/o Hemorrhagic stroke 2010 6.   Hypertension 7.   Sleep apnea 8.   Cellulitis 9.   Heart murmur 10.   Dyslipidemia 11.   Obesity 12.   Preoperative clearance 13.   Time Spent Directly with Patient:  45 minutes  Length of Stay:  LOS: 3 days   Pt with + CRF seen for pre op clearance. 2D shows mild LV dysfunction and myoview intermediate risk with ischemic abnormalities in LAD/LCX territories. I prefer defining his anatomy with cath to risk stratify him prior to surgery. I have discussed this with Mr Imel and he agrees to proceed. Exam benign. Labs OK.   Avonell Lenig J 06/16/2014, 7:59 AM

## 2014-06-16 NOTE — Progress Notes (Signed)
Patient ID: Alexander EdwardsDavid Pennington, male   DOB: 07/03/1961, 53 y.o.   MRN: [id number] Cleared by Cardiology for surgery on his feet.  Unfortunately due to timing, i will now not be able to take him to the OR until tomorrow late afternoon (10/2).  He will continue on IV antibiotics and we will proceed to surgery tomorrow to address both of his feet surgically.

## 2014-06-16 NOTE — Progress Notes (Signed)
[facility] Report taken. Denies pain.Alert and oriented. Saline lock left wrist. 0.9NS at 20cc/hr to rt forearm. Moniter shows NSR 71, O2 sat 100% room air.

## 2014-06-16 NOTE — Progress Notes (Signed)
RT went to patients room and patient had self administered CPAP. Patient seems to be resting well. RT will continue to assess and monitor as needed.

## 2014-06-17 ENCOUNTER — Inpatient Hospital Stay (HOSPITAL_COMMUNITY): Payer: BC Managed Care – PPO | Admitting: Anesthesiology

## 2014-06-17 ENCOUNTER — Encounter (HOSPITAL_COMMUNITY): Payer: BC Managed Care – PPO | Admitting: Anesthesiology

## 2014-06-17 ENCOUNTER — Encounter (HOSPITAL_COMMUNITY)
Admission: EM | Disposition: A | Discharge status: Home / Self Care | Payer: Self-pay | Source: Home / Self Care | Attending: Internal Medicine

## 2014-06-17 DIAGNOSIS — Z01818 Encounter for other preprocedural examination: Secondary | ICD-10-CM

## 2014-06-17 DIAGNOSIS — E119 Type 2 diabetes mellitus without complications: Secondary | ICD-10-CM

## 2014-06-17 DIAGNOSIS — R9439 Abnormal result of other cardiovascular function study: Secondary | ICD-10-CM

## 2014-06-17 HISTORY — PX: I & D EXTREMITY: SHX5045

## 2014-06-17 LAB — GLUCOSE, CAPILLARY
GLUCOSE-CAPILLARY: 107 mg/dL — AB (ref 70–99)
GLUCOSE-CAPILLARY: 112 mg/dL — AB (ref 70–99)
GLUCOSE-CAPILLARY: 217 mg/dL — AB (ref 70–99)
Glucose-Capillary: 166 mg/dL — ABNORMAL HIGH (ref 70–99)
Glucose-Capillary: 151 mg/dL — ABNORMAL HIGH (ref 70–99)
Glucose-Capillary: 91 mg/dL (ref 70–99)
Glucose-Capillary: 155 mg/dL — ABNORMAL HIGH (ref 70–99)

## 2014-06-17 LAB — BASIC METABOLIC PANEL
ANION GAP: 12 (ref 5–15)
BUN: 19 mg/dL (ref 6–23)
CO2: 25 meq/L (ref 19–32)
CREATININE: 0.59 mg/dL (ref 0.50–1.35)
Calcium: 8.6 mg/dL (ref 8.4–10.5)
Chloride: 100 mEq/L (ref 96–112)
GFR calc non Af Amer: >90 mL/min (ref >90)
Glucose, Bld: 129 mg/dL — ABNORMAL HIGH (ref 70–99)
POTASSIUM: 4.5 meq/L (ref 3.7–5.3)
SODIUM: 137 meq/L (ref 137–147)

## 2014-06-17 LAB — CBC
HCT: 35.5 % — ABNORMAL LOW (ref 39.0–52.0)
Hemoglobin: 12.2 g/dL — ABNORMAL LOW (ref 13.0–17.0)
MCH: 29.2 pg (ref 26.0–34.0)
MCHC: 34.4 g/dL (ref 30.0–36.0)
MCV: 84.9 fL (ref 78.0–100.0)
PLATELETS: 187 10*3/uL (ref 150–400)
RBC: 4.18 MIL/uL — AB (ref 4.22–5.81)
RDW: 12.9 % (ref 11.5–15.5)
WBC: 8.1 10*3/uL (ref 4.0–10.5)

## 2014-06-17 LAB — WOUND CULTURE: Gram Stain: NONE SEEN

## 2014-06-17 SURGERY — IRRIGATION AND DEBRIDEMENT EXTREMITY
Anesthesia: General | Site: Foot | Laterality: Bilateral

## 2014-06-17 MED ORDER — SUCCINYLCHOLINE CHLORIDE 20 MG/ML IJ SOLN
INTRAMUSCULAR | Status: DC | PRN
  Administered 2014-06-17: 100 mg via INTRAVENOUS

## 2014-06-17 MED ORDER — PROMETHAZINE HCL 25 MG/ML IJ SOLN: 6.2500 mg | INTRAMUSCULAR | Status: DC | PRN

## 2014-06-17 MED ORDER — HYDROMORPHONE HCL 2 MG/ML IJ SOLN
INTRAMUSCULAR | Status: AC
  Filled 2014-06-17: qty 1

## 2014-06-17 MED ORDER — PROPOFOL 10 MG/ML IV BOLUS
INTRAVENOUS | Status: DC | PRN
  Administered 2014-06-17: 200 mg via INTRAVENOUS

## 2014-06-17 MED ORDER — PROPOFOL 10 MG/ML IV BOLUS
INTRAVENOUS | Status: AC
  Filled 2014-06-17: qty 20

## 2014-06-17 MED ORDER — FENTANYL CITRATE 0.05 MG/ML IJ SOLN
INTRAMUSCULAR | Status: AC
  Filled 2014-06-17: qty 5

## 2014-06-17 MED ORDER — FENTANYL CITRATE 0.05 MG/ML IJ SOLN: 25.0000 ug | INTRAMUSCULAR | Status: DC | PRN

## 2014-06-17 MED ORDER — ONDANSETRON HCL 4 MG/2ML IJ SOLN
INTRAMUSCULAR | Status: DC | PRN
  Administered 2014-06-17: 4 mg via INTRAVENOUS

## 2014-06-17 MED ORDER — MIDAZOLAM HCL 2 MG/2ML IJ SOLN
INTRAMUSCULAR | Status: AC
  Filled 2014-06-17: qty 2

## 2014-06-17 MED ORDER — FENTANYL CITRATE 0.05 MG/ML IJ SOLN
INTRAMUSCULAR | Status: DC | PRN
  Administered 2014-06-17: 100 ug via INTRAVENOUS

## 2014-06-17 MED ORDER — PIPERACILLIN-TAZOBACTAM 3.375 G IVPB
INTRAVENOUS | Status: AC
  Filled 2014-06-17: qty 50

## 2014-06-17 MED ORDER — 0.9 % SODIUM CHLORIDE (POUR BTL) OPTIME
TOPICAL | Status: DC | PRN
  Administered 2014-06-17: 1000 mL

## 2014-06-17 MED ORDER — LIDOCAINE HCL (CARDIAC) 20 MG/ML IV SOLN
INTRAVENOUS | Status: AC
  Filled 2014-06-17: qty 5

## 2014-06-17 MED ORDER — MIDAZOLAM HCL 5 MG/5ML IJ SOLN
INTRAMUSCULAR | Status: DC | PRN
  Administered 2014-06-17: 2 mg via INTRAVENOUS

## 2014-06-17 MED ORDER — SODIUM CHLORIDE 0.9 % IR SOLN
Status: DC | PRN
  Administered 2014-06-17: 3000 mL

## 2014-06-17 MED FILL — Midazolam HCl Inj 2 MG/2ML (Base Equivalent): INTRAMUSCULAR | Qty: 2 | Status: AC

## 2014-06-17 MED FILL — Nitroglycerin IV Soln 200 MCG/ML in D5W: INTRAVENOUS | Qty: 1 | Status: AC

## 2014-06-17 MED FILL — Heparin Sodium (Porcine) 2 Unit/ML in Sodium Chloride 0.9%: INTRAMUSCULAR | Qty: 1500 | Status: AC

## 2014-06-17 MED FILL — Fentanyl Citrate Inj 0.05 MG/ML: INTRAMUSCULAR | Qty: 2 | Status: AC

## 2014-06-17 MED FILL — Heparin Sodium (Porcine) Inj 1000 Unit/ML: INTRAMUSCULAR | Qty: 10 | Status: AC

## 2014-06-17 SURGICAL SUPPLY — 34 items
BAG ZIPLOCK 12X15 (MISCELLANEOUS) IMPLANT
BANDAGE ESMARK 6X9 LF (GAUZE/BANDAGES/DRESSINGS) IMPLANT
BNDG COHESIVE 4X5 TAN STRL (GAUZE/BANDAGES/DRESSINGS) ×2 IMPLANT
BNDG ESMARK 6X9 LF (GAUZE/BANDAGES/DRESSINGS)
BNDG GAUZE ELAST 4 BULKY (GAUZE/BANDAGES/DRESSINGS) ×2 IMPLANT
CUFF TOURN SGL QUICK 18 (TOURNIQUET CUFF) IMPLANT
CUFF TOURN SGL QUICK 24 (TOURNIQUET CUFF)
CUFF TOURN SGL QUICK 34 (TOURNIQUET CUFF)
CUFF TRNQT CYL 24X4X40X1 (TOURNIQUET CUFF) IMPLANT
CUFF TRNQT CYL 34X4X40X1 (TOURNIQUET CUFF) IMPLANT
DRAIN PENROSE 18X1/2 LTX STRL (DRAIN) IMPLANT
DRSG PAD ABDOMINAL 8X10 ST (GAUZE/BANDAGES/DRESSINGS) IMPLANT
DURAPREP 26ML APPLICATOR (WOUND CARE) IMPLANT
ELECT REM PT RETURN 9FT ADLT (ELECTROSURGICAL) ×2
ELECTRODE REM PT RTRN 9FT ADLT (ELECTROSURGICAL) ×1 IMPLANT
GAUZE SPONGE 4X4 12PLY STRL (GAUZE/BANDAGES/DRESSINGS) ×2 IMPLANT
GAUZE XEROFORM 5X9 LF (GAUZE/BANDAGES/DRESSINGS) ×2 IMPLANT
GLOVE BIO SURGEON STRL SZ7.5 (GLOVE) ×2 IMPLANT
GLOVE BIOGEL PI IND STRL 8 (GLOVE) ×1 IMPLANT
GLOVE BIOGEL PI INDICATOR 8 (GLOVE) ×1
GLOVE ECLIPSE 8.0 STRL XLNG CF (GLOVE) ×2 IMPLANT
GOWN STRL REUS W/TWL XL LVL3 (GOWN DISPOSABLE) ×4 IMPLANT
HANDPIECE INTERPULSE COAX TIP (DISPOSABLE) ×1
KIT BASIN OR (CUSTOM PROCEDURE TRAY) ×2 IMPLANT
PACK ORTHO EXTREMITY (CUSTOM PROCEDURE TRAY) IMPLANT
PACK TOTAL JOINT (CUSTOM PROCEDURE TRAY) ×2 IMPLANT
PAD CAST 4YDX4 CTTN HI CHSV (CAST SUPPLIES) IMPLANT
PADDING CAST COTTON 4X4 STRL (CAST SUPPLIES)
POSITIONER SURGICAL ARM (MISCELLANEOUS) IMPLANT
SET HNDPC FAN SPRY TIP SCT (DISPOSABLE) ×1 IMPLANT
SOL PREP PROV IODINE SCRUB 4OZ (MISCELLANEOUS) ×2 IMPLANT
SUT ETHILON 2 0 PS N (SUTURE) ×4 IMPLANT
SYR CONTROL 10ML LL (SYRINGE) IMPLANT
TOWEL OR 17X26 10 PK STRL BLUE (TOWEL DISPOSABLE) ×2 IMPLANT

## 2014-06-17 NOTE — Brief Op Note (Signed)
06/13/2014 - 06/17/2014  6:13 PM  PATIENT:  Waynard Edwardsavid Levit  53 y.o. male  PRE-OPERATIVE DIAGNOSIS:  bilateral foot wounds  POST-OPERATIVE DIAGNOSIS:  bilateral foot wounds  PROCEDURE:  Procedure(s): IRRIGATION AND DEBRIDEMENT BILATERAL FOOT WOUNDS (Bilateral)  SURGEON:  Surgeon(s) and Role:    * Kathryne Hitchhristopher Y Dailon Sheeran, MD - Primary  PHYSICIAN ASSISTANT:   ASSISTANTS: none   ANESTHESIA:   general  EBL:  Total I/O In: 240 [P.O.:240] Out: -   BLOOD ADMINISTERED:none  DRAINS: none   LOCAL MEDICATIONS USED:  NONE  SPECIMEN:  No Specimen  DISPOSITION OF SPECIMEN:  N/A  COUNTS:  YES  TOURNIQUET:    DICTATION: .Other Dictation: Dictation Number [id number]  PATIENT DISPOSITION:  PACU - hemodynamically stable.   Delay start of Pharmacological VTE agent (>24hrs) due to surgical blood loss or risk of bleeding: not applicable

## 2014-06-17 NOTE — Progress Notes (Signed)
Orthopedic Tech Progress Note Patient Details:  Alexander EdwardsDavid Emami 09/29/1960 [id number] Delivered bilateral post op shoes to pt.  Patient ID: Alexander EdwardsDavid Brinley, male   DOB: 09/21/1960, 53 y.o.   MRN: [id number]   Lesle ChrisGilliland, Kden Wagster L 06/17/2014, 10:03 PM

## 2014-06-17 NOTE — Anesthesia Preprocedure Evaluation (Addendum)
Anesthesia Evaluation  Patient identified by MRN, date of birth, ID band Patient awake    Reviewed: Allergy & Precautions, H&P , NPO status , Patient's Chart, lab work & pertinent test results  History of Anesthesia Complications (+) history of anesthetic complications (Reports history of requiring reintubation after an anesthetic and decrease O2 sat. [facility] and these [facility] were reviewed. 7/13: patient required 2 person mask secondary to beard and obesity, was a grade 1 view.Mac 3,)  Airway Mallampati: III TM Distance: >3 FB Neck ROM: Full    Dental no notable dental hx. (+) Dental Advisory Given, Poor Dentition   Pulmonary sleep apnea and Continuous Positive Airway Pressure Ventilation , former smoker,  breath sounds clear to auscultation  Pulmonary exam normal       Cardiovascular hypertension, Pt. on medications negative cardio ROS  + Valvular Problems/Murmurs Rhythm:Regular Rate:Normal + Systolic murmurs During cardiac clearance workup, stress testing demonstrated intermediate risk w/ ischemic abnormalities in LAD/LCx territories therefore a left heart cath was performed and demonstrated mod/severe L circumflex stenosis, nonobstructive RCA stenosis, widely patent LAD and left main, normal LV systolic fxn. Cardiology opted for no stents and medical therapy given no symptoms. Placed at mod to high risk for adverse cardiopulm outcome   Neuro/Psych CVA negative neurological ROS  negative psych ROS   GI/Hepatic negative GI ROS, Neg liver ROS,   Endo/Other  diabetes, Type 2, Insulin DependentMorbid obesity  Renal/GU negative Renal ROS  negative genitourinary   Musculoskeletal negative musculoskeletal ROS (+)   Abdominal   Peds negative pediatric ROS (+)  Hematology negative hematology ROS (+)   Anesthesia Other Findings [facility] Record: 7/13--> 2 person mask secondary to beard  and obesity, grade 1 view with Mac 3. 10/13--> RSI, grade 1 view MAC 4  Reproductive/Obstetrics negative OB ROS                         Anesthesia Physical Anesthesia Plan  ASA: III  Anesthesia Plan: General   Post-op Pain Management:    Induction: Intravenous  Airway Management Planned: Oral ETT and LMA  Additional Equipment:   Intra-op Plan:   Post-operative Plan: Extubation in OR  Informed Consent: I have reviewed the patients History and Physical, chart, labs and discussed the procedure including the risks, benefits and alternatives for the proposed anesthesia with the patient or authorized representative who has indicated his/her understanding and acceptance.   Dental advisory given  Plan Discussed with: CRNA  Anesthesia Plan Comments: (Patient was offered regional block and declined)       Anesthesia Quick Evaluation

## 2014-06-17 NOTE — Anesthesia Postprocedure Evaluation (Signed)
  Anesthesia Post-op Note  Patient: Alexander EdwardsDavid Pennington  Procedure(s) Performed: Procedure(s) (LRB): IRRIGATION AND DEBRIDEMENT BILATERAL FOOT WOUNDS (Bilateral)  Patient Location: PACU  Anesthesia Type: General  Level of Consciousness: awake and alert   Airway and Oxygen Therapy: Patient Spontanous Breathing  Post-op Pain: mild  Post-op Assessment: Post-op Vital signs reviewed, Patient's Cardiovascular Status Stable, Respiratory Function Stable, Patent Airway and No signs of Nausea or vomiting  Last Vitals:  Filed Vitals:   06/17/14 1335  BP: 108/59  Pulse: 60  Temp: 36.6 C  Resp: 18    Post-op Vital Signs: stable   Complications: No apparent anesthesia complications

## 2014-06-17 NOTE — Op Note (Signed)
NAME:  Alexander Pennington, Alexander Pennington NO.:  [id number]  MEDICAL RECORD NO.:  [id number]  LOCATION:  1438                         FACILITY:  [facility]  PHYSICIAN:  Vanita Panda. Magnus Ivan, M.D.DATE OF BIRTH:  1961/01/06  DATE OF PROCEDURE:  06/17/2014 DATE OF DISCHARGE:                              OPERATIVE REPORT   PREOPERATIVE DIAGNOSIS: 1. Left foot infection with abscess between the 1st and 2nd toes with     chronic plantar foot wound. 2. 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  POSTOPERATIVE DIAGNOSIS: 1. Left foot infection with abscess between the 1st and 2nd toes with     chronic plantar foot wound. 2. 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  PROCEDURE:  Irrigation and debridement of bilateral foot wounds with sharp excisional debridement of skin and soft tissue only.  SURGEON:  Vanita Panda. Magnus Ivan, M.D.  ANESTHESIA:  General.  BLOOD LOSS:  Less than 50 mL.  COMPLICATIONS:  None.  INDICATIONS:  Mr. Alexander Pennington is a 53 year old diabetic gentleman with a long history of bilateral foot problems.  He has been followed at [facility] for a long period of time.  He was admitted to the [facility] here in [place] on Monday after worsening redness and infection involving his left foot.  He has been treated in a boot for sometime on his right foot due to Charcot changes and a chronic wound on the bottom of that foot beginning.  He goes to a center in [place] for wound care.  He would like to have all of his care for his feet being in [place].  He understands that he has significant infection involving his left foot and significant Charcot changes in his right foot and with his diabetes, this may worsen to the point that he would need amputations.  We had set him up for surgery this past Tuesday, but he needed a  cardiology clearance.  He ended up getting a stress test, I believe a cardiac catheterization, now he finally presents for operative intervention involving both his feet. During this hospitalization, his cellulitis improved dramatically on his left foot and his white blood cell count peripherally has normalized. He has had minimal pain.  He understands the risks and benefits of surgery and the reason why we are still proceeding to surgery.  Of note, he has a previous MTP fusion of the left foot 1st ray, and there is always concern there could be osteomyelitis involving this.  PROCEDURE DESCRIPTION:  After informed consent was obtained, appropriate left and right feet were marked.  He was brought to the operating room, placed supine on the operating table.  General anesthesia was then obtained and both feet were prepped and draped with Betadine paint up to the mid calf on both sides.  Time-out was called identifying correct patient, correct right and left feet.  I decided to turn my attention to the right foot first as this was the most one that did not have any type of infection, but there is  a plantar wound that was full thickness, had a Charcot foot deformity with now Rocker-bottom foot.  Using a #10 blade, I was able to debride skin and soft tissue to good bleeding base tissue.  There was no exposed bone.  No gross purulence.  I then thoroughly irrigated this with normal saline solution using pulsatile lavage.  We then were able to place a piece of Xeroform over the plantar wound, dressing sponges, Kerlix, and Coban.  Attention was then turned to the left foot with the right foot covered up.  Between the 1st and 2nd toes, there was a significant area of necrotic tissue and then just plantar to this, a plantar wound with gross purulence.  I was able to make an incision in the 1st web space caring this dorsally and plantarly to connect the wounds and found some necrotic tissue that I  removed sharply with the knife of just soft tissue.  This was not tracked down to the bone at all.  I did not undermine anything on the 1st ray in terms of the 1st MTP joint because nothing tracked around here at all and there was no redness associated with this.  Using a rongeur, I did remove some necrotic fascial tissue and then used 3 L of pulsatile lavage solution to lavage throughout this area.  I then loosely approximated the skin with interrupted 2-0 nylon suture.  Xeroform and a well-padded sterile dressings were applied around here as well.  He was awakened, extubated, and taken to recovery room in stable condition. All final counts were correct.  There were no complications noted. Postoperatively, we will have him in postoperative shoes and let him weight bear as tolerated.  We will need to keep these dressings on his feet until follow up in the office early next week.     Vanita Pandahristopher Y. Magnus IvanBlackman, M.D.     CYB/MEDQ  D:  06/17/2014  T:  06/17/2014  Job:  [id number]

## 2014-06-17 NOTE — Progress Notes (Signed)
Patient ID: Alexander EdwardsDavid Pennington, male   DOB: 08/04/1961, 53 y.o.   MRN: [id number] I was able to perform an irrigation and debridement of both his feet.  He needs to keep both feet dressings clean and dry and leave the dressings in place until his outpatient orthopedic follow-up in 5-6 days.

## 2014-06-17 NOTE — Discharge Instructions (Addendum)
Radial Site Care Refer to this sheet in the next few weeks. These instructions provide you with information on caring for yourself after your procedure. Your caregiver may also give you more specific instructions. Your treatment has been planned according to current medical practices, but problems sometimes occur. Call your caregiver if you have any problems or questions after your procedure. HOME CARE INSTRUCTIONS  You may shower the day after the procedure.Remove the bandage (dressing) and gently wash the site with plain soap and water.Gently pat the site dry.  Do not apply powder or lotion to the site.  Do not submerge the affected site in water for 3 to 5 days.  Inspect the site at least twice daily.  Do not flex or bend the affected arm for 24 hours.  No lifting over 5 pounds (2.3 kg) for 5 days after your procedure.  Do not drive home if you are discharged the same day of the procedure. Have someone else drive you.  You may drive 24 hours after the procedure unless otherwise instructed by your caregiver.  Do not operate machinery or power tools for 24 hours.  A responsible adult should be with you for the first 24 hours after you arrive home. What to expect:  Any bruising will usually fade within 1 to 2 weeks.  Blood that collects in the tissue (hematoma) may be painful to the touch. It should usually decrease in size and tenderness within 1 to 2 weeks. SEEK IMMEDIATE MEDICAL CARE IF:  You have unusual pain at the radial site.  You have redness, warmth, swelling, or pain at the radial site.  You have drainage (other than a small amount of blood on the dressing).  You have chills.  You have a fever or persistent symptoms for more than 72 hours.  You have a fever and your symptoms suddenly get worse.  Your arm becomes pale, cool, tingly, or numb.  You have heavy bleeding from the site. Hold pressure on the site. Document Released: 10/05/2010 Document Revised:  11/25/2011 Document Reviewed: 10/05/2010 [facility] Patient Information 2015 [employer] [place]. This information is not intended to replace advice given to you by your health care provider. Make sure you discuss any questions you have with your health care provider.  KEEP YOUR DRESSINGS ON YOUR FEET CLEAN AND DRY. LEAVE THE DRESSINGS INTACT UNTIL YOUR OUTPATIENT ORTHOPEDIC FOLLOW-UP CALL [facility] AT [facility] FOR A FOLLOW-UP APPOINTMENT IN 5-6 DAYS [phone].

## 2014-06-17 NOTE — Progress Notes (Addendum)
RT went to check with patient about his CPAP. Patient said he would self administer when he was ready. RT checked patients setting to make sure it was correct.RT will assess and monitor as needed.

## 2014-06-17 NOTE — Progress Notes (Signed)
Subjective:  No CP/SOB, S/P Cath yesterday via the [facility]  Objective:  Temp:  [97.7 F (36.5 C)-98 F (36.7 C)] 97.7 F (36.5 C) (10/02 0441) Pulse Rate:  [60-79] 60 (10/02 0441) Resp:  [13-26] 20 (10/02 0441) BP: (110-158)/(43-92) 110/43 mmHg (10/02 0441) SpO2:  [95 %-100 %] 100 % (10/02 0441) Weight:  [268 lb 4.8 oz (121.7 kg)] 268 lb 4.8 oz (121.7 kg) (10/02 0441) Weight change:   Intake/Output from previous day: 10/01 0701 - 10/02 0700 In: 240 [P.O.:240] Out: 150 [Urine:150]  Intake/Output from this shift:    Physical Exam: General appearance: alert and no distress Neck: no adenopathy, no carotid bruit, no JVD, supple, symmetrical, trachea midline and thyroid not enlarged, symmetric, no tenderness/mass/nodules Lungs: clear to auscultation bilaterally Heart: regular rate and rhythm, S1, S2 normal, no murmur, click, rub or gallop Extremities: extremities normal, atraumatic, no cyanosis or edema  Lab Results: Results for orders placed during the hospital encounter of 06/13/14 (from the past 48 hour(s))  GLUCOSE, CAPILLARY     Status: Abnormal   Collection Time    06/15/14  1:31 PM      Result Value Ref Range   Glucose-Capillary 103 (*) 70 - 99 mg/dL  GLUCOSE, CAPILLARY     Status: Abnormal   Collection Time    06/15/14  6:33 PM      Result Value Ref Range   Glucose-Capillary 132 (*) 70 - 99 mg/dL  GLUCOSE, CAPILLARY     Status: Abnormal   Collection Time    06/15/14  9:17 PM      Result Value Ref Range   Glucose-Capillary 144 (*) 70 - 99 mg/dL  VANCOMYCIN, TROUGH     Status: Abnormal   Collection Time    06/16/14  4:57 AM      Result Value Ref Range   Vancomycin Tr 8.7 (*) 10.0 - 20.0 ug/mL  BASIC METABOLIC PANEL     Status: Abnormal   Collection Time    06/16/14  4:57 AM      Result Value Ref Range   Sodium 139  137 - 147 mEq/L   Potassium 4.2  3.7 - 5.3 mEq/L   Chloride 100  96 - 112 mEq/L   CO2 26  19 - 32 mEq/L   Glucose, Bld 133 (*) 70 - 99  mg/dL   BUN 15  6 - 23 mg/dL   Creatinine, Ser 0.60  0.50 - 1.35 mg/dL   Calcium 8.8  8.4 - 10.5 mg/dL   GFR calc non Af Amer >90  >90 mL/min   GFR calc Af Amer >90  >90 mL/min   Comment: (NOTE)     The eGFR has been calculated using the CKD EPI equation.     This calculation has not been validated in all clinical situations.     eGFR's persistently <90 mL/min signify possible Chronic Kidney     Disease.   Anion gap 13  5 - 15  GLUCOSE, CAPILLARY     Status: Abnormal   Collection Time    06/16/14  5:00 AM      Result Value Ref Range   Glucose-Capillary 122 (*) 70 - 99 mg/dL  CBC     Status: None   Collection Time    06/16/14  9:45 AM      Result Value Ref Range   WBC 6.6  4.0 - 10.5 K/uL   RBC 4.84  4.22 - 5.81 MIL/uL   Hemoglobin 14.2  13.0 - 17.0 g/dL   HCT 40.9  39.0 - 52.0 %   MCV 84.5  78.0 - 100.0 fL   MCH 29.3  26.0 - 34.0 pg   MCHC 34.7  30.0 - 36.0 g/dL   RDW 12.7  11.5 - 15.5 %   Platelets 188  150 - 400 K/uL  PROTIME-INR     Status: None   Collection Time    06/16/14  9:45 AM      Result Value Ref Range   Prothrombin Time 13.9  11.6 - 15.2 seconds   INR 1.07  0.00 - 1.49  GLUCOSE, CAPILLARY     Status: Abnormal   Collection Time    06/16/14 12:14 PM      Result Value Ref Range   Glucose-Capillary 121 (*) 70 - 99 mg/dL  GLUCOSE, CAPILLARY     Status: Abnormal   Collection Time    06/16/14  8:36 PM      Result Value Ref Range   Glucose-Capillary 188 (*) 70 - 99 mg/dL   Comment 1 Documented in Chart     Comment 2 Notify RN    GLUCOSE, CAPILLARY     Status: Abnormal   Collection Time    06/17/14  2:19 AM      Result Value Ref Range   Glucose-Capillary 166 (*) 70 - 99 mg/dL   Comment 1 Documented in Chart     Comment 2 Notify RN    GLUCOSE, CAPILLARY     Status: Abnormal   Collection Time    06/17/14  4:43 AM      Result Value Ref Range   Glucose-Capillary 112 (*) 70 - 99 mg/dL   Comment 1 Documented in Chart     Comment 2 Notify RN    CBC      Status: Abnormal   Collection Time    06/17/14  4:59 AM      Result Value Ref Range   WBC 8.1  4.0 - 10.5 K/uL   RBC 4.18 (*) 4.22 - 5.81 MIL/uL   Hemoglobin 12.2 (*) 13.0 - 17.0 g/dL   HCT 35.5 (*) 39.0 - 52.0 %   MCV 84.9  78.0 - 100.0 fL   MCH 29.2  26.0 - 34.0 pg   MCHC 34.4  30.0 - 36.0 g/dL   RDW 12.9  11.5 - 15.5 %   Platelets 187  150 - 400 K/uL  BASIC METABOLIC PANEL     Status: Abnormal   Collection Time    06/17/14  4:59 AM      Result Value Ref Range   Sodium 137  137 - 147 mEq/L   Potassium 4.5  3.7 - 5.3 mEq/L   Chloride 100  96 - 112 mEq/L   CO2 25  19 - 32 mEq/L   Glucose, Bld 129 (*) 70 - 99 mg/dL   BUN 19  6 - 23 mg/dL   Creatinine, Ser 0.59  0.50 - 1.35 mg/dL   Calcium 8.6  8.4 - 10.5 mg/dL   GFR calc non Af Amer >90  >90 mL/min   GFR calc Af Amer >90  >90 mL/min   Comment: (NOTE)     The eGFR has been calculated using the CKD EPI equation.     This calculation has not been validated in all clinical situations.     eGFR's persistently <90 mL/min signify possible Chronic Kidney     Disease.   Anion gap 12  5 - 15  Imaging: Imaging results have been reviewed  Assessment/Plan:   1. Principal Problem: 2.   Diabetic foot infection 3. Active Problems: 4.   Diabetes mellitus with foot ulcer 5.   H/o Hemorrhagic stroke 2010 6.   Hypertension 7.   Sleep apnea 8.   Cellulitis 9.   Heart murmur 10.   Dyslipidemia 11.   Obesity 12.   Preoperative clearance 13.   Time Spent Directly with Patient:  20 minutes  Length of Stay:  LOS: 4 days   Results of cath noted. Pt has moderate but not critical CAD. The LCX-OM branch stenosis (75%) probably corresponds to the inferolateral perfusion abnormality. Agree with aggressive medical Rx. Can proceed with surgery at low risk. We will arrange F/U with Dr. Mare Ferrari as an OP. Will S/O for the remainder of the hospitalization but will be available for any questions or issues that arise.  Lorretta Harp 06/17/2014, 8:32 AM

## 2014-06-17 NOTE — Transfer of Care (Signed)
Immediate Anesthesia Transfer of Care Note  Patient: Alexander EdwardsDavid Pennington  Procedure(s) Performed: Procedure(s): IRRIGATION AND DEBRIDEMENT BILATERAL FOOT WOUNDS (Bilateral)  Patient Location: PACU  Anesthesia Type:General  Level of Consciousness: awake, sedated and patient cooperative  Airway & Oxygen Therapy: Patient Spontanous Breathing and Patient connected to face mask oxygen  Post-op Assessment: Report given to PACU RN and Post -op Vital signs reviewed and stable  Post vital signs: Reviewed and stable  Complications: No apparent anesthesia complications

## 2014-06-17 NOTE — Progress Notes (Signed)
Patient ID: Alexander Pennington, male   DOB: 01/02/1961, 53 y.o.   MRN: [id number] Mr. Alexander Pennington understands fully that we will proceed to the OR late today for irrigation and debridement of bilateral foot wounds.

## 2014-06-17 NOTE — Progress Notes (Signed)
Patient ID: Alexander Pennington, male   DOB: 12/06/1960, 53 y.o.   MRN: [id number]  [facility] PROGRESS NOTE  Alexander Pennington NWG:[id number] DOB: 08/18/1961 DOA: 06/13/2014 PCP: PROVIDER NOT IN SYSTEM  Brief narrative:  53 y.o. 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 He developed a small blister on the side of his left big toe and for 2 days prior to admission he developed redness around his left foot with the open sore between the left first and second toe. He also developed a sore on the plantar aspect of his right foot. In the past [facility] has [facility]. In the ER workup consistent with left foot cellulitis/abscess. Right foot plantar open sore with infection, orthopedics was consulted who requested hospitalist admission. He has had complications with anesthesia previously so cardiology was consulted. Stress test done 9/30 and pt has been cleared for surgery.   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  OSA  - home CPAP device.  Leukocytosis  - resolving with abx, WNL this AM  Mild normocytic anemia  - slight drop in Hg since admission  - repeat CBC in AM   DVT prophylaxis  Heparin SUB Q  Code Status: Full  Family Communication: Pt at bedside  Disposition Plan: Home when medically stable   IV Access:   Peripheral IV Procedures and diagnostic studies:   Dg Chest Port 1 View 06/13/2014 No active disease.  Myoview stress test 9/30 --> negative for ischemia  Cardiac cath 10/01  1. Moderately severe left circumflex stenosis  2. Nonobstructive RCA stenosis  3. Widely patent LAD and left main  4. Normal LV systolic function   Recommendations: In this patient who has no angina, recommend medical therapy. He does not have high risk coronary anatomy and best course of action is to proceed with surgery without any intervention prior to surgery. PCI could be considered if he develops ischemic symptoms.  Medical Consultants:   Cardiology  Ortho Other Consultants:   None  Anti-Infectives:  Vancomycin 9/28 -->  Zosyn 9/28 -->  Alexander Presto, MD  Alexander Pennington Pager [phone]  If 7PM-7AM, please contact night-coverage [email or web address] Password Alexander [email or web address] 06/17/2014, 9:17 AM   LOS: 4 days   HPI/Subjective: No events overnight.   Objective: Filed Vitals:   06/16/14 1840 06/16/14 1855 06/16/14 2019 06/17/14 0441  BP: 129/59 127/71 154/85 110/43  Pulse: 77 76 74 60  Temp:   98 F (36.7 C) 97.7 F (36.5 C)  TempSrc:   Oral Oral  Resp: 16 21  20   Height:      Weight:     121.7 kg (268 lb 4.8 oz)  SpO2: 97% 95% 96% 100%    Intake/Output Summary (Last 24 hours) at 06/17/14 4540 Last data filed at 06/17/14 0600  Gross per 24 hour  Intake    240 ml  Output    150 ml  Net     90 ml    Exam:   General:  Pt is alert, follows commands appropriately, not in acute distress  Cardiovascular: Regular rate and rhythm, SEM 2/6, no rubs, no gallops  Respiratory: Clear to auscultation bilaterally, no wheezing, no crackles, no rhonchi  Abdomen: Soft, non tender, non distended, bowel sounds present, no guarding  Extremities: No edema, pulses DP and PT palpable bilaterally  Neuro: Grossly nonfocal  Data Reviewed: Basic Metabolic Panel:  Recent Labs Lab 06/13/14 1330 06/14/14 0500 06/16/14 0457 06/17/14 0459  NA 135* 135* 139 137  K 4.4 3.9 4.2 4.5  CL 95* 99 100 100  CO2 25 25 26 25   GLUCOSE 307* 270* 133* 129*  BUN 16 14 15 19   CREATININE 0.55 0.57 0.60 0.59  CALCIUM 9.2 8.5 8.8 8.6   CBC:  Recent Labs Lab 06/13/14 1330 06/14/14 0500 06/16/14 0945 06/17/14 0459  WBC 15.4* 9.2 6.6 8.1  NEUTROABS 13.5*  --   --   --   HGB 13.9 12.4* 14.2 12.2*  HCT 39.5 36.1* 40.9 35.5*  MCV 84.8 86.4 84.5 84.9  PLT 174 PLATELET CLUMPS NOTED ON SMEAR, UNABLE TO ESTIMATE 188 187   CBG:  Recent Labs Lab 06/16/14 1214 06/16/14 1534 06/16/14 2036 06/17/14 0219 06/17/14 0443  GLUCAP 121* 107* 188* 166* 112*    Recent Results (from the past 240 hour(s))  WOUND CULTURE     Status: None   Collection Time    06/13/14  1:15 PM      Result Value Ref Range Status   Specimen Description FOOT   Final   Special Requests Immunocompromised   Final   Gram Stain     Final   Value: NO WBC SEEN     RARE SQUAMOUS EPITHELIAL CELLS PRESENT     RARE GRAM NEGATIVE RODS     RARE GRAM POSITIVE COCCI     IN PAIRS   Culture     Final   Value: FEW STAPHYLOCOCCUS AUREUS     Note: RIFAMPIN AND GENTAMICIN SHOULD NOT BE USED AS SINGLE DRUGS FOR TREATMENT OF STAPH  INFECTIONS.     MODERATE GROUP B STREP(S.AGALACTIAE)ISOLATED     Note: TESTING AGAINST S. AGALACTIAE NOT ROUTINELY PERFORMED DUE TO PREDICTABILITY OF AMP/PEN/VAN SUSCEPTIBILITY.     Performed at [facility]   Report Status PENDING   Incomplete  CULTURE, BLOOD (ROUTINE X 2)     Status: None   Collection Time    06/13/14  1:31 PM      Result Value Ref Range Status   Specimen Description BLOOD BLOOD RIGHT FOREARM  Final   Special Requests BOTTLES DRAWN AEROBIC AND ANAEROBIC 5 ML   Final   Culture  Setup Time     Final   Value: 06/13/2014 17:31     Performed at [facility]   Culture     Final   Value:        BLOOD CULTURE RECEIVED NO GROWTH TO DATE CULTURE WILL BE HELD FOR 5 DAYS BEFORE ISSUING A FINAL NEGATIVE REPORT     Performed at [facility]   Report Status PENDING   Incomplete  CULTURE, BLOOD (ROUTINE X 2)     Status: None   Collection Time    06/13/14  1:31 PM      Result Value Ref Range Status   Specimen Description BLOOD LEFT HAND   Final   Special Requests BOTTLES DRAWN AEROBIC AND ANAEROBIC 4 CC   Final   Culture  Setup Time     Final   Value: 06/13/2014 17:31     Performed at [facility]   Culture     Final   Value:        BLOOD CULTURE RECEIVED NO GROWTH TO DATE CULTURE WILL BE HELD FOR 5 DAYS BEFORE ISSUING A FINAL NEGATIVE REPORT     Performed at [facility]   Report Status PENDING   Incomplete  SURGICAL PCR SCREEN     Status: None   Collection Time    06/14/14  8:00 AM      Result Value Ref Range Status   MRSA, PCR NEGATIVE  NEGATIVE Final   Staphylococcus aureus NEGATIVE  NEGATIVE Final   Comment:            The Xpert SA Assay (FDA     approved for NASAL specimens     in patients over 61 years of age),     is one component of     a comprehensive surveillance     program.  Test performance has     been validated by [employer] for patients greater     than or equal to 99 year old.     It is not intended      to diagnose infection nor to     guide or monitor treatment.    Scheduled Meds: . aspirin  81 mg Oral Pre-Cath  . Canagliflozin  1 tablet Oral Daily  . carvedilol  3.125 mg Oral BID WC  . celecoxib  400 mg Oral BID  . cholecalciferol  20,000 Units Oral Weekly  . gabapentin  1,200 mg Oral TID  . heparin subcutaneous  5,000 Units Subcutaneous 3 times per day  . insulin aspart  0-5 Units Subcutaneous QHS  . insulin aspart  0-9 Units Subcutaneous TID WC  . insulin NPH Human  6 Units Subcutaneous BID AC & HS  . piperacillin-tazobactam (ZOSYN)  IV  3.375 g Intravenous Q8H  . regadenoson  0.4 mg Intravenous Once  . sodium chloride  3 mL Intravenous Q12H  . sodium chloride  3 mL Intravenous Q12H  . sodium chloride  3 mL Intravenous Q12H  . traMADol  100 mg Oral TID  . valsartan  40 mg Oral Daily  . vancomycin  1,500 mg Intravenous Q12H   Continuous Infusions: . sodium chloride 50 mL/hr at 06/16/14 1336

## 2014-06-18 LAB — BASIC METABOLIC PANEL
Anion gap: 13 (ref 5–15)
BUN: 16 mg/dL (ref 6–23)
CO2: 23 mEq/L (ref 19–32)
CREATININE: 0.61 mg/dL (ref 0.50–1.35)
Calcium: 8.4 mg/dL (ref 8.4–10.5)
Chloride: 103 mEq/L (ref 96–112)
GFR calc Af Amer: >90 mL/min (ref >90)
GFR calc non Af Amer: >90 mL/min (ref >90)
GLUCOSE: 125 mg/dL — AB (ref 70–99)
POTASSIUM: 4.1 meq/L (ref 3.7–5.3)
Sodium: 139 mEq/L (ref 137–147)

## 2014-06-18 LAB — CBC
HCT: 34.2 % — ABNORMAL LOW (ref 39.0–52.0)
Hemoglobin: 11.7 g/dL — ABNORMAL LOW (ref 13.0–17.0)
MCH: 29.3 pg (ref 26.0–34.0)
MCHC: 34.2 g/dL (ref 30.0–36.0)
MCV: 85.7 fL (ref 78.0–100.0)
Platelets: 184 K/uL (ref 150–400)
RBC: 3.99 MIL/uL — ABNORMAL LOW (ref 4.22–5.81)
RDW: 12.8 % (ref 11.5–15.5)
WBC: 7.7 K/uL (ref 4.0–10.5)

## 2014-06-18 LAB — GLUCOSE, CAPILLARY
Glucose-Capillary: 131 mg/dL — ABNORMAL HIGH (ref 70–99)
Glucose-Capillary: 114 mg/dL — ABNORMAL HIGH (ref 70–99)
Glucose-Capillary: 151 mg/dL — ABNORMAL HIGH (ref 70–99)
Glucose-Capillary: 214 mg/dL — ABNORMAL HIGH (ref 70–99)
Glucose-Capillary: 154 mg/dL — ABNORMAL HIGH (ref 70–99)

## 2014-06-18 NOTE — Progress Notes (Signed)
Patient ID: Alexander Pennington, male   DOB: [birth year], 53 y.o.   MRN: [id number]  [facility] PROGRESS NOTE  Alexander Pennington WUJ:[id number] DOB: Dec 22, 1960 DOA: 06/13/2014 PCP: PROVIDER NOT IN SYSTEM  Brief narrative:  53 y.o. 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 He developed a small blister on the side of his left big toe and for 2 days prior to admission he developed redness around his left foot with the open sore between the left first and second toe. He also developed a sore on the plantar aspect of his right foot. In the past he has had some foot surgery done in [facility] by Dr. Ronda Fairly. In the ER workup consistent with left foot cellulitis/abscess. Right foot plantar open sore with infection, orthopedics was consulted who requested hospitalist admission. He has had complications with anesthesia previously so cardiology was consulted. Stress test done 9/30 and pt has been cleared for surgery.   Assessment/Plan  Left foot diabetic ulcer with cellulitis and abscess possible osteomyelitis, right foot plantar aspect open sore with possible infection and cellulitis  - status post I&D of the bilateral foot wounds, post op day #1 - Continue vanc and zosyn day 6, plan on transitioning to oral ABX upon discharge  - BCx NGTD  - Wound culture: Mixed GP and GN bacteria  - Plan for discharge tomorrow or Monday , dressings to remain intact until F/U in office next week  - appreciate ortho assistance  Possible CAD with suggestion of possible old posterior MI and reduced EF on ECHO 45-50%  - Appreciate cardiology assistance:  - Tele: NSR, no serious arrhythmias  - cardiac cath results  below  Systolic heart failure with mildly reduced EF  - please note that pt reported intolerance to BB so this has not been provided  - Continue ARB  DM type II, uncontrolled with peripheral neuropathy  - diet advanced  - Continue Invocana  - A1c 8.2  Essential hypertension  - reasonable inpatient control  - Continue Coreg  - Continue reduced dose ARB  Diabetic neuropathy  - Continue Neurontin.  OSA  - home CPAP device.  Leukocytosis  - resolving with abx, WNL this AM  Mild normocytic anemia  - slight drop in Hg since admission   DVT prophylaxis  Heparin SUB Q  Code Status: Full  Family Communication: Pt at bedside  Disposition Plan: Home in 24 - 48 hours   IV Access:   Peripheral IV Procedures and diagnostic studies:   Dg Chest Port 1 View 06/13/2014 No active disease.  Myoview stress test 9/30 --> negative for ischemia  I&D bilateral foot wounds 10/02 --> done by Dr. Magnus Ivan  Cardiac cath 10/01  1. Moderately severe left circumflex stenosis  2. Nonobstructive RCA stenosis  3. Widely patent LAD and left main  4. Normal LV systolic function  Recommendations: In this patient who has no angina, recommend medical therapy. He does not have high risk coronary anatomy and best course of action is to proceed with surgery without any intervention prior to surgery. PCI could be considered if he develops ischemic symptoms.  Medical Consultants:   Cardiology  Ortho Other Consultants:   None  Anti-Infectives:   Vancomycin 9/28 -->  Zosyn 9/28 -->   Debbora PrestoMAGICK-Kijana Cromie, MD  [facility] Pager [phone]  If 7PM-7AM, please contact night-coverage [email or web address] Password [email or web address] 06/18/2014, 1:28 PM   LOS: 5 days   HPI/Subjective: No events overnight.   Objective: Filed Vitals:   06/17/14 1845 06/17/14 1847 06/17/14 2123 06/18/14 0538  BP: 134/67 134/70 115/46 125/57  Pulse: 62 65 62 65  Temp:  98 F (36.7 C) 98.5 F (36.9 C) 97.7 F (36.5 C)  TempSrc:   Oral Oral  Resp: 12  16 16 15   Height:      Weight:      SpO2: 100% 98% 98% 99%    Intake/Output Summary (Last 24 hours) at 06/18/14 1328 Last data filed at 06/18/14 0900  Gross per 24 hour  Intake   2120 ml  Output    500 ml  Net   1620 ml    Exam:   General:  Pt is alert, follows commands appropriately, not in acute distress  Cardiovascular: Regular rate and rhythm, no rubs, no gallops  Respiratory: Clear to auscultation bilaterally, no wheezing, no crackles, no rhonchi  Abdomen: Soft, non tender, non distended, bowel sounds present, no guarding   Data Reviewed: Basic Metabolic Panel:  Recent Labs Lab 06/13/14 1330 06/14/14 0500 06/16/14 0457 06/17/14 0459 06/18/14 0440  NA 135* 135* 139 137 139  K 4.4 3.9 4.2 4.5 4.1  CL 95* 99 100 100 103  CO2 25 25 26 25 23   GLUCOSE 307* 270* 133* 129* 125*  BUN 16 14 15 19 16   CREATININE 0.55 0.57 0.60 0.59 0.61  CALCIUM 9.2 8.5 8.8 8.6 8.4   CBC:  Recent Labs Lab 06/13/14 1330 06/14/14 0500 06/16/14 0945 06/17/14 0459 06/18/14 0440  WBC 15.4* 9.2 6.6 8.1 7.7  NEUTROABS 13.5*  --   --   --   --   HGB 13.9 12.4* 14.2 12.2* 11.7*  HCT 39.5 36.1* 40.9 35.5* 34.2*  MCV 84.8 86.4 84.5 84.9 85.7  PLT 174 PLATELET CLUMPS NOTED ON SMEAR, UNABLE TO ESTIMATE 188 187 184   CBG:  Recent Labs Lab 06/17/14 1819 06/17/14 2117 06/18/14 0302 06/18/14 0750 06/18/14 1155  GLUCAP 91 155* 131* 114* 151*    Recent Results (from the past 240 hour(s))  WOUND CULTURE     Status: None   Collection Time    06/13/14  1:15 PM      Result Value Ref Range Status   Specimen Description FOOT   Final   Special Requests Immunocompromised   Final   Gram Stain     Final   Value: NO WBC SEEN     RARE SQUAMOUS EPITHELIAL CELLS PRESENT     RARE GRAM NEGATIVE RODS     RARE GRAM POSITIVE COCCI     IN PAIRS   Culture     Final   Value: FEW STAPHYLOCOCCUS AUREUS     Note: RIFAMPIN AND GENTAMICIN SHOULD NOT BE USED AS SINGLE DRUGS FOR TREATMENT OF STAPH  INFECTIONS.     MODERATE GROUP B STREP(S.AGALACTIAE)ISOLATED     Note: TESTING AGAINST S. AGALACTIAE NOT ROUTINELY PERFORMED DUE TO PREDICTABILITY OF AMP/PEN/VAN SUSCEPTIBILITY.     Performed at [facility]   Report Status 06/17/2014 FINAL   Final   Organism ID, Bacteria STAPHYLOCOCCUS AUREUS   Final  CULTURE, BLOOD (ROUTINE X 2)     Status: None   Collection Time    06/13/14  1:31 PM  Result Value Ref Range Status   Specimen Description BLOOD BLOOD RIGHT FOREARM   Final   Special Requests BOTTLES DRAWN AEROBIC AND ANAEROBIC 5 ML   Final   Culture  Setup Time     Final   Value: 06/13/2014 17:31     Performed at [facility]   Culture     Final   Value:        BLOOD CULTURE RECEIVED NO GROWTH TO DATE CULTURE WILL BE HELD FOR 5 DAYS BEFORE ISSUING A FINAL NEGATIVE REPORT     Performed at [facility]   Report Status PENDING   Incomplete  CULTURE, BLOOD (ROUTINE X 2)     Status: None   Collection Time    06/13/14  1:31 PM      Result Value Ref Range Status   Specimen Description BLOOD LEFT HAND   Final   Special Requests BOTTLES DRAWN AEROBIC AND ANAEROBIC 4 CC   Final   Culture  Setup Time     Final   Value: 06/13/2014 17:31     Performed at [facility]   Culture     Final   Value:        BLOOD CULTURE RECEIVED NO GROWTH TO DATE CULTURE WILL BE HELD FOR 5 DAYS BEFORE ISSUING A FINAL NEGATIVE REPORT     Performed at [facility]   Report Status PENDING   Incomplete  SURGICAL PCR SCREEN     Status: None   Collection Time    06/14/14  8:00 AM      Result Value Ref Range Status   MRSA, PCR NEGATIVE  NEGATIVE Final   Staphylococcus aureus NEGATIVE  NEGATIVE Final   Comment:            The Xpert SA Assay (FDA     approved for NASAL specimens     in patients over 57 years of age),     is one component of     a comprehensive surveillance     program.  Test performance has     been validated by [employer] for patients greater      than or equal to 74 year old.     It is not intended     to diagnose infection nor to     guide or monitor treatment.     Scheduled Meds: . Canagliflozin  1 tablet Oral Daily  . carvedilol  3.125 mg Oral BID WC  . celecoxib  400 mg Oral BID  . cholecalciferol  20,000 Units Oral Weekly  . gabapentin  1,200 mg Oral TID  . heparin subcutaneous  5,000 Units Subcutaneous 3 times per day  . insulin aspart  0-5 Units Subcutaneous QHS  . insulin aspart  0-9 Units Subcutaneous TID WC  . insulin NPH Human  6 Units Subcutaneous BID AC & HS  . piperacillin-tazobactam (ZOSYN)  IV  3.375 g Intravenous Q8H  . regadenoson  0.4 mg Intravenous Once  . sodium chloride  3 mL Intravenous Q12H  . sodium chloride  3 mL Intravenous Q12H  . traMADol  100 mg Oral TID  . valsartan  40 mg Oral Daily  . vancomycin  1,500 mg Intravenous Q12H   Continuous Infusions:

## 2014-06-18 NOTE — Progress Notes (Addendum)
Subjective: 1 Day Post-Op Procedure(s) (LRB): IRRIGATION AND DEBRIDEMENT BILATERAL FOOT WOUNDS (Bilateral) Patient reports pain as mild.    Objective: Vital signs in last 24 hours: Temp:  [97.6 F (36.4 C)-98.5 F (36.9 C)] 97.7 F (36.5 C) (10/03 0538) Pulse Rate:  [60-70] 65 (10/03 0538) Resp:  [12-21] 15 (10/03 0538) BP: (103-134)/(45-70) 125/57 mmHg (10/03 0538) SpO2:  [98 %-100 %] 99 % (10/03 0538)  Intake/Output from previous day: 10/02 0701 - 10/03 0700 In: 2000 [P.O.:600; I.V.:850; IV Piggyback:550] Out: 500 [Urine:500] Intake/Output this shift: Total I/O In: 360 [P.O.:360] Out: -    Recent Labs  06/16/14 0945 06/17/14 0459 06/18/14 0440  HGB 14.2 12.2* 11.7*    Recent Labs  06/17/14 0459 06/18/14 0440  WBC 8.1 7.7  RBC 4.18* 3.99*  HCT 35.5* 34.2*  PLT 187 184    Recent Labs  06/17/14 0459 06/18/14 0440  NA 137 139  K 4.5 4.1  CL 100 103  CO2 25 23  BUN 19 16  CREATININE 0.59 0.61  GLUCOSE 129* 125*  CALCIUM 8.6 8.4    Recent Labs  06/16/14 0945  INR 1.07    Dressings intact, minimal discomfort  Assessment/Plan: 1 Day Post-Op Procedure(s) (LRB): IRRIGATION AND DEBRIDEMENT BILATERAL FOOT WOUNDS (Bilateral) Advance diet Plan for discharge tomorrow or Monday , dressings to remain intact until F/U in office next week Staph species sensitive to multiple antibiotics-septra might be good choice Alexander Pennington W 06/18/2014, 11:35 AM

## 2014-06-18 NOTE — Progress Notes (Signed)
Pt stated that he would self administer CPAP when ready for bed.   RT to monitor and assess as needed.

## 2014-06-19 LAB — CULTURE, BLOOD (ROUTINE X 2)
CULTURE: NO GROWTH
Culture: NO GROWTH

## 2014-06-19 LAB — GLUCOSE, CAPILLARY
GLUCOSE-CAPILLARY: 100 mg/dL — AB (ref 70–99)
GLUCOSE-CAPILLARY: 211 mg/dL — AB (ref 70–99)
Glucose-Capillary: 119 mg/dL — ABNORMAL HIGH (ref 70–99)

## 2014-06-19 LAB — VANCOMYCIN, TROUGH: VANCOMYCIN TR: 9.4 ug/mL — AB (ref 10.0–20.0)

## 2014-06-19 MED ORDER — CEPHALEXIN 500 MG PO CAPS: 500.0000 mg | ORAL_CAPSULE | Freq: Three times a day (TID) | ORAL | Status: DC

## 2014-06-19 MED ORDER — HYDROCODONE-ACETAMINOPHEN 5-325 MG PO TABS: 1.0000 | ORAL_TABLET | Freq: Four times a day (QID) | ORAL | Status: DC | PRN

## 2014-06-19 MED ORDER — VANCOMYCIN HCL 10 G IV SOLR
1250.0000 mg | Freq: Three times a day (TID) | INTRAVENOUS | Status: DC
  Filled 2014-06-19: qty 1250

## 2014-06-19 NOTE — Progress Notes (Signed)
Pt has been refusing Coreg & NPH insulin.

## 2014-06-19 NOTE — Discharge Summary (Signed)
Physician Discharge Summary  Alexander Pennington TMH:[id number] DOB: October 03, 1960 DOA: 06/13/2014  PCP: PROVIDER NOT IN SYSTEM  Admit date: 06/13/2014 Discharge date: 06/19/2014  Recommendations for Outpatient Follow-up:  1. Pt will need to follow up with PCP in 2-3 weeks post discharge 2. Please obtain BMP to evaluate electrolytes and kidney function 3. Please also check CBC to evaluate Hg and Hct levels 4. Continue Keflex upon discharge for three more weeks as recommended by ID specialist Dr. Ninetta Lights   Discharge Diagnoses:  Principal Problem:   Diabetic foot infection Active Problems:   Diabetes mellitus with foot ulcer   H/o Hemorrhagic stroke 2010   Hypertension   Sleep apnea   Cellulitis   Heart murmur   Dyslipidemia   Obesity   Preoperative clearance    Discharge Condition: Stable  Diet recommendation: Heart healthy diet discussed in details   Brief narrative:  53 y.o. 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 He developed a small blister on the side of his left big toe and for 2 days prior to admission he developed redness around his left foot with the open sore between the left first and second toe. He also developed a sore on the plantar aspect of his right foot. In the past he has had some foot surgery done in [facility] by Dr. Ronda Fairly. In the ER workup consistent with left foot cellulitis/abscess. Right foot plantar open sore with infection, orthopedics was consulted who requested hospitalist admission. He has had complications with anesthesia previously so cardiology was consulted. Stress test done 9/30 and pt has been cleared for surgery.   Assessment/Plan   Left foot diabetic ulcer with cellulitis and abscess possible osteomyelitis, right foot plantar aspect open sore with possible infection and cellulitis  - status post I&D of the bilateral foot wounds, post op day #2, clinically stable and wants to go home  - Continue vanc and zosyn day 7, plan on transitioning to oral ABX Keflex - this was discussed with Dr. Ninetta Lights ID specialist on call  - BCx NGTD  - Wound culture: Mixed GP and GN bacteria  Possible CAD with suggestion of possible old posterior MI and reduced EF on ECHO 45-50%  - Appreciate cardiology assistance:  - Tele: NSR, no serious arrhythmias  - cardiac cath results below  Systolic heart failure with mildly reduced EF  - please note that pt reported intolerance to BB so this has not been provided  - Continue ARB  DM type II, uncontrolled with peripheral neuropathy  - diet advanced  - Continue Invocana  - A1c 8.2  Essential hypertension  - reasonable inpatient control  - Continue reduced dose ARB  Diabetic neuropathy  - Continue Neurontin.  OSA  - home CPAP device.  Leukocytosis  - resolving with abx, WNL this AM  Mild normocytic anemia  - slight drop in Hg since admission    Code Status: Full  Family Communication: Pt at bedside  Disposition Plan: Home   IV Access:   Peripheral IV Procedures and diagnostic studies:   Dg Chest Port 1 View 06/13/2014 No active disease.  Myoview stress test 9/30 --> negative for ischemia  I&D bilateral foot wounds  10/02 --> done by Dr. Magnus IvanBlackman  Cardiac cath 10/01  1. Moderately severe left circumflex stenosis  2. Nonobstructive RCA stenosis  3. Widely patent LAD and left main  4. Normal LV systolic function  Recommendations: In this patient who has no angina, recommend medical therapy. He does not have high risk coronary anatomy and best course of action is to proceed with surgery without any intervention prior to surgery. PCI could be considered if he develops ischemic symptoms.   Medical Consultants:   Cardiology  Ortho Other Consultants:   None  Anti-Infectives:   Vancomycin 9/28 --> 10/04 Zosyn 9/28 --> 10/04 Keflex 10/04 --> 3 more weeks post discharge   Discharge Exam: Filed Vitals:   06/19/14 0627  BP: 129/61  Pulse: 67  Temp: 98.2 F (36.8 C)  Resp: 18   Filed Vitals:   06/18/14 0538 06/18/14 1429 06/18/14 2117 06/19/14 0627  BP: 125/57 138/80 157/75 129/61  Pulse: 65 60 73 67  Temp: 97.7 F (36.5 C) 97.6 F (36.4 C) 98.3 F (36.8 C) 98.2 F (36.8 C)  TempSrc: Oral Oral Oral Oral  Resp: 15 16 18 18   Height:      Weight:    119.432 kg (263 lb 4.8 oz)  SpO2: 99% 100% 100% 100%    General: Pt is alert, follows commands appropriately, not in acute distress Cardiovascular: Regular rate and rhythm, no rubs, no gallops Respiratory: Clear to auscultation bilaterally, no wheezing, no crackles, no rhonchi Abdominal: Soft, non tender, non distended, bowel sounds +, no guarding  Discharge Instructions  Discharge Instructions   Diet - low sodium heart healthy    Complete by:  As directed      Increase activity slowly    Complete by:  As directed             Medication List         ANDROGEL PUMP TD  Place 2 drops onto the skin daily. 2 drops= pumps     celecoxib 400 MG capsule  Commonly known as:  CELEBREX  Take 400 mg by mouth 2 (two) times daily.     cephALEXin 500 MG capsule  Commonly known as:  KEFLEX  Take 1 capsule (500 mg total) by mouth 3 (three) times daily.     cholecalciferol 1000 UNITS tablet  Commonly known as:  VITAMIN D  Take 20,000 Units by mouth once a week.     Folic Acid-Vit B6-Vit B12 2.5-25-1 MG Tabs tablet  Commonly known as:  [name]  Take 1 tablet by mouth daily.     gabapentin 600 MG tablet  Commonly known as:  NEURONTIN  Take 1,200 mg by mouth 3 (three) times daily.     HYDROcodone-acetaminophen 5-325 MG per tablet  Commonly known as:  NORCO/VICODIN  Take 1 tablet by mouth every 6 (six) hours as  needed for moderate pain.     INVOKANA 300 MG Tabs  Generic drug:  Canagliflozin  Take 1 tablet by mouth daily.     metFORMIN 1000 MG (MOD) 24 hr tablet  Commonly known as:  GLUMETZA  Take 1,000 mg by mouth 2 (two) times daily with a meal.     traMADol 50 MG tablet  Commonly known as:  ULTRAM  Take 100 mg by mouth 3 (three) times daily.     valsartan 320 MG tablet  Commonly known as:  DIOVAN  Take 320 mg by mouth daily.           Follow-up Information  Follow up with Kathryne Hitch, MD. Schedule an appointment as soon as possible for a visit in 5 days.   Specialty:  Orthopedic Surgery   Contact information:   [address] [place] [address] [phone]       Follow up with Debbora Presto, MD. (As needed, If symptoms worsen)    Specialty:  Internal Medicine   Contact information:   [address] [place] [address] [phone]        The results of significant diagnostics from this hospitalization (including imaging, microbiology, ancillary and laboratory) are listed below for reference.     Microbiology: Recent Results (from the past 240 hour(s))  WOUND CULTURE     Status: None   Collection Time    06/13/14  1:15 PM      Result Value Ref Range Status   Specimen Description FOOT   Final   Special Requests Immunocompromised   Final   Gram Stain     Final   Value: NO WBC SEEN     RARE SQUAMOUS EPITHELIAL CELLS PRESENT     RARE GRAM NEGATIVE RODS     RARE GRAM POSITIVE COCCI     IN PAIRS   Culture     Final   Value: FEW STAPHYLOCOCCUS AUREUS     Note: RIFAMPIN AND GENTAMICIN SHOULD NOT BE USED AS SINGLE DRUGS FOR TREATMENT OF STAPH INFECTIONS.     MODERATE GROUP B STREP(S.AGALACTIAE)ISOLATED     Note: TESTING AGAINST S. AGALACTIAE NOT ROUTINELY PERFORMED DUE TO PREDICTABILITY OF AMP/PEN/VAN SUSCEPTIBILITY.     Performed at [facility]   Report Status 06/17/2014 FINAL   Final   Organism ID, Bacteria  STAPHYLOCOCCUS AUREUS   Final  CULTURE, BLOOD (ROUTINE X 2)     Status: None   Collection Time    06/13/14  1:31 PM      Result Value Ref Range Status   Specimen Description BLOOD BLOOD RIGHT FOREARM   Final   Special Requests BOTTLES DRAWN AEROBIC AND ANAEROBIC 5 ML   Final   Culture  Setup Time     Final   Value: 06/13/2014 17:31     Performed at [facility]   Culture     Final   Value: NO GROWTH 5 DAYS     Performed at [facility]   Report Status 06/19/2014 FINAL   Final  CULTURE, BLOOD (ROUTINE X 2)     Status: None   Collection Time    06/13/14  1:31 PM      Result Value Ref Range Status   Specimen Description BLOOD LEFT HAND   Final   Special Requests BOTTLES DRAWN AEROBIC AND ANAEROBIC 4 CC   Final   Culture  Setup Time     Final   Value: 06/13/2014 17:31     Performed at [facility]   Culture     Final   Value: NO GROWTH 5 DAYS     Performed at [facility]   Report Status 06/19/2014 FINAL   Final  SURGICAL PCR SCREEN     Status: None   Collection Time    06/14/14  8:00 AM      Result Value Ref Range Status   MRSA, PCR NEGATIVE  NEGATIVE Final   Staphylococcus aureus NEGATIVE  NEGATIVE Final   Comment:            The Xpert SA Assay (FDA     approved  for NASAL specimens     in patients over 3 years of age),     is one component of     a comprehensive surveillance     program.  Test performance has     been validated by [facility] for patients greater     than or equal to 3 year old.     It is not intended     to diagnose infection nor to     guide or monitor treatment.     Labs: Basic Metabolic Panel:  Recent Labs Lab 06/13/14 1330 06/14/14 0500 06/16/14 0457 06/17/14 0459 06/18/14 0440  NA 135* 135* 139 137 139  K 4.4 3.9 4.2 4.5 4.1  CL 95* 99 100 100 103  CO2 25 25 26 25 23   GLUCOSE 307* 270* 133* 129* 125*  BUN 16 14 15 19 16   CREATININE 0.55 0.57 0.60 0.59 0.61  CALCIUM 9.2 8.5 8.8 8.6 8.4    CBC:  Recent Labs Lab 06/13/14 1330 06/14/14 0500 06/16/14 0945 06/17/14 0459 06/18/14 0440  WBC 15.4* 9.2 6.6 8.1 7.7  NEUTROABS 13.5*  --   --   --   --   HGB 13.9 12.4* 14.2 12.2* 11.7*  HCT 39.5 36.1* 40.9 35.5* 34.2*  MCV 84.8 86.4 84.5 84.9 85.7  PLT 174 PLATELET CLUMPS NOTED ON SMEAR, UNABLE TO ESTIMATE 188 187 184   CBG:  Recent Labs Lab 06/18/14 1155 06/18/14 1649 06/18/14 2155 06/19/14 0324 06/19/14 0721  GLUCAP 151* 214* 154* 119* 100*   SIGNED: Time coordinating discharge: Over 30 minutes  Debbora Presto, MD  [facility] 06/19/2014, 11:02 AM Pager [phone]  If 7PM-7AM, please contact night-coverage [email or web address] Password [email or web address]

## 2014-06-19 NOTE — Progress Notes (Signed)
Dressings c/d/i Cx: MSSA, strep Would recommend ID consult for treatment recs and duration of therapy, otherwise may dc from ortho standpoint.  Alexander ReelN. Michael Xu, MD [facility] [phone] 10:01 AM

## 2014-06-19 NOTE — Progress Notes (Signed)
ANTIBIOTIC CONSULT NOTE   Pharmacy Consult for Vancomycin and Zosyn Indication: Diabetic foot infection, rule out osteomyelitis  Allergies  Allergen Reactions  . Tetanus Toxoids     "Sore arm all the way down, Painful, red"  . Ciprofloxacin Other (See Comments)    Altered mental status  . Eggs Or Egg-Derived Products Diarrhea    Constant flu after flu shot.  . Nsaids Other (See Comments)    Cannot take nsaids-  etc due to heart   . Statins Other (See Comments)    York SpanielSaid it causes pain?   Patient Measurements: Height: 5' 8.5" (174 cm) Weight: 263 lb 4.8 oz (119.432 kg) IBW/kg (Calculated) : 69.55  Vital Signs: Temp: 98.2 F (36.8 C) (10/04 0627) Temp Source: Oral (10/04 0627) BP: 129/61 mmHg (10/04 0627) Pulse Rate: 67 (10/04 0627) Intake/Output from previous day: 10/03 0701 - 10/04 0700 In: 1750 [P.O.:600; IV Piggyback:1150] Out: -  Intake/Output from this shift: Total I/O In: 240 [P.O.:240] Out: -   Labs:  Recent Labs  06/17/14 0459 06/18/14 0440  WBC 8.1 7.7  HGB 12.2* 11.7*  PLT 187 184  CREATININE 0.59 0.61   Estimated Creatinine Clearance: 132.1 ml/min (by C-G formula based on Cr of 0.61).  Recent Labs  06/19/14 0904  VANCOTROUGH 9.4*    Microbiology: Recent Results (from the past 720 hour(s))  WOUND CULTURE     Status: None   Collection Time    06/13/14  1:15 PM      Result Value Ref Range Status   Specimen Description FOOT   Final   Special Requests Immunocompromised   Final   Gram Stain     Final   Value: NO WBC SEEN     RARE SQUAMOUS EPITHELIAL CELLS PRESENT     RARE GRAM NEGATIVE RODS     RARE GRAM POSITIVE COCCI     IN PAIRS   Culture     Final   Value: FEW STAPHYLOCOCCUS AUREUS     Note: RIFAMPIN AND GENTAMICIN SHOULD NOT BE USED AS SINGLE DRUGS FOR TREATMENT OF STAPH INFECTIONS.     MODERATE GROUP B STREP(S.AGALACTIAE)ISOLATED     Note: TESTING AGAINST S. AGALACTIAE NOT ROUTINELY PERFORMED DUE TO PREDICTABILITY OF AMP/PEN/VAN  SUSCEPTIBILITY.     [facility]formed at [facility]   Report Status 06/17/2014 FINAL   Final   Organism ID, Bacteria STAPHYLOCOCCUS AUREUS   Final  CULTURE, BLOOD (ROUTINE X 2)     Status: None   Collection Time    06/13/14  1:31 PM      Result Value Ref Range Status   Specimen Description BLOOD BLOOD RIGHT FOREARM   Final   Special Requests BOTTLES DRAWN AEROBIC AND ANAEROBIC 5 ML   Final   Culture  Setup Time     Final   Value: 06/13/2014 17:31     Performed at [facility]   Culture     Final   Value: NO GROWTH 5 DAYS     Performed at [facility]   Report Status 06/19/2014 FINAL   Final  CULTURE, BLOOD (ROUTINE X 2)     Status: None   Collection Time    06/13/14  1:31 PM      Result Value Ref Range Status   Specimen Description BLOOD LEFT HAND   Final   Special Requests BOTTLES DRAWN AEROBIC AND ANAEROBIC 4 CC   Final   Culture  Setup Time     Final   Value:  06/13/2014 17:31     Performed at [facility]   Culture     Final   Value: NO GROWTH 5 DAYS     Performed at [facility]   Report Status 06/19/2014 FINAL   Final  SURGICAL PCR SCREEN     Status: None   Collection Time    06/14/14  8:00 AM      Result Value Ref Range Status   MRSA, PCR NEGATIVE  NEGATIVE Final   Staphylococcus aureus NEGATIVE  NEGATIVE Final   Comment:            The Xpert SA Assay (FDA     approved for NASAL specimens     in patients over 33 years of age),     is one component of     a comprehensive surveillance     program.  Test performance has     been validated by [employer] for patients greater     than or equal to 39 year old.     It is not intended     to diagnose infection nor to     guide or monitor treatment.   Medical History: Past Medical History  Diagnosis Date  . Diabetes mellitus   . Hemorrhagic stroke     a. Dx 2010. 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 Unclear etiology.  . Hypertension   . Sleep apnea   . Neuropathy   .  Diabetic Charcot foot   . Murmur, heart Fall 2014 - diagnosed  . Complication of anesthesia     hx deviated septum, sleep apnea- has had problems  . Obesity     a. hx of lap band surgery.   Medications:  Anti-infectives   Start     Dose/Rate Route Frequency Ordered Stop   06/19/14 0000  cephALEXin (KEFLEX) 500 MG capsule     500 mg Oral 3 times daily 06/19/14 1101     06/16/14 1800  vancomycin (VANCOCIN) 1,500 mg in sodium chloride 0.9 % 500 mL IVPB     1,500 mg 250 mL/hr over 120 Minutes Intravenous Every 12 hours 06/16/14 0824     06/14/14 0600  vancomycin (VANCOCIN) IVPB 1000 mg/200 mL premix  Status:  Discontinued     1,000 mg 200 mL/hr over 60 Minutes Intravenous Every 12 hours 06/13/14 1819 06/16/14 0824   06/14/14 0000  piperacillin-tazobactam (ZOSYN) IVPB 3.375 g     3.375 g 12.5 mL/hr over 240 Minutes Intravenous Every 8 hours 06/13/14 1819     06/13/14 1830  vancomycin (VANCOCIN) 1,500 mg in sodium chloride 0.9 % 500 mL IVPB     1,500 mg 250 mL/hr over 120 Minutes Intravenous  Once 06/13/14 1819 06/14/14 0105   06/13/14 1315  piperacillin-tazobactam (ZOSYN) IVPB 3.375 g     3.375 g 100 mL/hr over 30 Minutes Intravenous  Once 06/13/14 1314 06/13/14 1634   06/13/14 1315  vancomycin (VANCOCIN) IVPB 1000 mg/200 mL premix     1,000 mg 200 mL/hr over 60 Minutes Intravenous  Once 06/13/14 1314 06/13/14 1530     Assessment: 53 yoM [facility] ED on [facility]/28 with acute left foot infection and chronic right foot wound. PMH includes DM with chronic charcot changes in right foot; pt states MRI at [facility] was negative for infection. He is now seeking care for left foot pain, redness, drainage. First doses and Vancomycin and Zosyn were given in ED. Pharmacy is consulted to dose Vancomycin  and Zosyn. Cardiology had cleared patient to proceed with I&D.  9/28 >> Vanc >> 9/28 >> Zosyn >>   Tmax: afeb WBCs: WNL Renal: SCr WNL, normalized CrCl > 1106ml/min  9/28 blood x2: NGTD 9/28  wound: Moderate group B strep/ MSSA (resistant only to PCN/TCN)  Drug level / dose changes info: 10/1 VT = 8.7 mcg/ml on 1gm IV q12h (prior to 6th dose), dose increased to 1500mg  IV q12h 10/4 VT = 9.4 mcg/ml on 1500mg  q12,  Had held off on q8h dosing d/t obesity and fear of accumulation and risk of nephrotoxicity  Goal of Therapy:  Vancomycin trough level 15-20 mcg/ml Appropriate abx dosing, eradication of infection.   Plan:  Day #4 antibiotics  Continue Zosyn 3.375g IV Q8H infused over 4hrs.   Vancomycin trough low again despite dose increase  Will adjust Vancomycin dose and schedule: Vancomycin 1250mg  IV q8hr  Check trough prior to 5th dose of q8hr dosing  Otho Bellows PharmD Pager [phone] 06/19/2014, 12:11 PM

## 2014-06-20 ENCOUNTER — Encounter (HOSPITAL_COMMUNITY): Payer: Self-pay | Admitting: Orthopaedic Surgery

## 2014-06-30 ENCOUNTER — Other Ambulatory Visit (HOSPITAL_COMMUNITY): Payer: Self-pay | Admitting: Orthopedic Surgery

## 2014-07-04 ENCOUNTER — Encounter (HOSPITAL_COMMUNITY): Payer: Self-pay | Admitting: Pharmacy Technician

## 2014-07-05 ENCOUNTER — Encounter (HOSPITAL_COMMUNITY): Payer: Self-pay | Admitting: *Deleted

## 2014-07-05 MED ORDER — CEFAZOLIN SODIUM-DEXTROSE 2-3 GM-% IV SOLR
2.0000 g | INTRAVENOUS | Status: DC
  Filled 2014-07-05: qty 50

## 2014-07-05 NOTE — Progress Notes (Signed)
Pt states that he has issues with dropping his O2 sats if his O2 is taken off too early in PACU.

## 2014-07-06 ENCOUNTER — Encounter (HOSPITAL_COMMUNITY)
Admission: RE | Disposition: A | Discharge status: Home / Self Care | Payer: Self-pay | Source: Ambulatory Visit | Attending: Orthopedic Surgery

## 2014-07-06 ENCOUNTER — Ambulatory Visit (HOSPITAL_COMMUNITY)
Admission: RE | Admit: 2014-07-06 | Discharge: 2014-07-06 | Disposition: A | Discharge status: Home / Self Care | Payer: BC Managed Care – PPO | Source: Ambulatory Visit | Attending: Orthopedic Surgery | Admitting: Orthopedic Surgery

## 2014-07-06 ENCOUNTER — Encounter (HOSPITAL_COMMUNITY): Payer: Self-pay | Admitting: Certified Registered"

## 2014-07-06 ENCOUNTER — Encounter (HOSPITAL_COMMUNITY): Payer: BC Managed Care – PPO | Admitting: Anesthesiology

## 2014-07-06 ENCOUNTER — Ambulatory Visit (HOSPITAL_COMMUNITY): Payer: BC Managed Care – PPO | Admitting: Anesthesiology

## 2014-07-06 DIAGNOSIS — I10 Essential (primary) hypertension: Secondary | ICD-10-CM | POA: Insufficient documentation

## 2014-07-06 DIAGNOSIS — E11621 Type 2 diabetes mellitus with foot ulcer: Secondary | ICD-10-CM

## 2014-07-06 DIAGNOSIS — E119 Type 2 diabetes mellitus without complications: Secondary | ICD-10-CM | POA: Diagnosis not present

## 2014-07-06 DIAGNOSIS — Z87891 Personal history of nicotine dependence: Secondary | ICD-10-CM | POA: Diagnosis not present

## 2014-07-06 DIAGNOSIS — Z91012 Allergy to eggs: Secondary | ICD-10-CM | POA: Diagnosis not present

## 2014-07-06 DIAGNOSIS — Y793 Surgical instruments, materials and orthopedic devices (including sutures) associated with adverse incidents: Secondary | ICD-10-CM | POA: Insufficient documentation

## 2014-07-06 DIAGNOSIS — T8484XA Pain due to internal orthopedic prosthetic devices, implants and grafts, initial encounter: Secondary | ICD-10-CM | POA: Insufficient documentation

## 2014-07-06 DIAGNOSIS — Z881 Allergy status to other antibiotic agents status: Secondary | ICD-10-CM | POA: Insufficient documentation

## 2014-07-06 DIAGNOSIS — M86172 Other acute osteomyelitis, left ankle and foot: Secondary | ICD-10-CM

## 2014-07-06 DIAGNOSIS — Z888 Allergy status to other drugs, medicaments and biological substances status: Secondary | ICD-10-CM | POA: Diagnosis not present

## 2014-07-06 DIAGNOSIS — E669 Obesity, unspecified: Secondary | ICD-10-CM | POA: Insufficient documentation

## 2014-07-06 DIAGNOSIS — Z6839 Body mass index (BMI) 39.0-39.9, adult: Secondary | ICD-10-CM | POA: Insufficient documentation

## 2014-07-06 DIAGNOSIS — Z887 Allergy status to serum and vaccine status: Secondary | ICD-10-CM | POA: Insufficient documentation

## 2014-07-06 DIAGNOSIS — I69398 Other sequelae of cerebral infarction: Secondary | ICD-10-CM | POA: Insufficient documentation

## 2014-07-06 DIAGNOSIS — G4730 Sleep apnea, unspecified: Secondary | ICD-10-CM | POA: Insufficient documentation

## 2014-07-06 DIAGNOSIS — M868X7 Other osteomyelitis, ankle and foot: Secondary | ICD-10-CM | POA: Diagnosis present

## 2014-07-06 DIAGNOSIS — A5216 Charcot's arthropathy (tabetic): Secondary | ICD-10-CM | POA: Insufficient documentation

## 2014-07-06 DIAGNOSIS — M86672 Other chronic osteomyelitis, left ankle and foot: Secondary | ICD-10-CM | POA: Insufficient documentation

## 2014-07-06 DIAGNOSIS — R011 Cardiac murmur, unspecified: Secondary | ICD-10-CM | POA: Insufficient documentation

## 2014-07-06 DIAGNOSIS — G629 Polyneuropathy, unspecified: Secondary | ICD-10-CM | POA: Insufficient documentation

## 2014-07-06 DIAGNOSIS — H538 Other visual disturbances: Secondary | ICD-10-CM | POA: Diagnosis not present

## 2014-07-06 DIAGNOSIS — L97509 Non-pressure chronic ulcer of other part of unspecified foot with unspecified severity: Secondary | ICD-10-CM

## 2014-07-06 HISTORY — PX: AMPUTATION: SHX166

## 2014-07-06 HISTORY — DX: Calculus of kidney: N20.0

## 2014-07-06 HISTORY — PX: HARDWARE REMOVAL: SHX979

## 2014-07-06 HISTORY — DX: Family history of other specified conditions: Z84.89

## 2014-07-06 LAB — CBC
HEMATOCRIT: 44.1 % (ref 39.0–52.0)
Hemoglobin: 15 g/dL (ref 13.0–17.0)
MCH: 29.1 pg (ref 26.0–34.0)
MCHC: 34 g/dL (ref 30.0–36.0)
MCV: 85.6 fL (ref 78.0–100.0)
PLATELETS: 227 10*3/uL (ref 150–400)
RBC: 5.15 MIL/uL (ref 4.22–5.81)
RDW: 13.7 % (ref 11.5–15.5)
WBC: 6.6 10*3/uL (ref 4.0–10.5)

## 2014-07-06 LAB — COMPREHENSIVE METABOLIC PANEL
ALBUMIN: 4 g/dL (ref 3.5–5.2)
ALT: 14 U/L (ref 0–53)
AST: 16 U/L (ref 0–37)
Alkaline Phosphatase: 85 U/L (ref 39–117)
Anion gap: 14 (ref 5–15)
BILIRUBIN TOTAL: 0.5 mg/dL (ref 0.3–1.2)
BUN: 15 mg/dL (ref 6–23)
CHLORIDE: 101 meq/L (ref 96–112)
CO2: 23 mEq/L (ref 19–32)
CREATININE: 0.57 mg/dL (ref 0.50–1.35)
Calcium: 9.6 mg/dL (ref 8.4–10.5)
GFR calc Af Amer: >90 mL/min (ref >90)
GFR calc non Af Amer: >90 mL/min (ref >90)
Glucose, Bld: 191 mg/dL — ABNORMAL HIGH (ref 70–99)
Potassium: 4.6 mEq/L (ref 3.7–5.3)
Sodium: 138 mEq/L (ref 137–147)
Total Protein: 8.1 g/dL (ref 6.0–8.3)

## 2014-07-06 LAB — GLUCOSE, CAPILLARY
Glucose-Capillary: 205 mg/dL — ABNORMAL HIGH (ref 70–99)
Glucose-Capillary: 204 mg/dL — ABNORMAL HIGH (ref 70–99)

## 2014-07-06 LAB — APTT: aPTT: 34 seconds (ref 24–37)

## 2014-07-06 LAB — PROTIME-INR
INR: 1.07 (ref 0.00–1.49)
PROTHROMBIN TIME: 14 s (ref 11.6–15.2)

## 2014-07-06 SURGERY — AMPUTATION, FOOT, RAY
Anesthesia: Monitor Anesthesia Care | Laterality: Left

## 2014-07-06 MED ORDER — EPHEDRINE SULFATE 50 MG/ML IJ SOLN
INTRAMUSCULAR | Status: AC
  Filled 2014-07-06: qty 1

## 2014-07-06 MED ORDER — FENTANYL CITRATE 0.05 MG/ML IJ SOLN
INTRAMUSCULAR | Status: DC | PRN
  Administered 2014-07-06: 50 ug via INTRAVENOUS
  Administered 2014-07-06: 100 ug via INTRAVENOUS

## 2014-07-06 MED ORDER — ONDANSETRON HCL 4 MG/2ML IJ SOLN
INTRAMUSCULAR | Status: AC
  Filled 2014-07-06: qty 2

## 2014-07-06 MED ORDER — FENTANYL CITRATE 0.05 MG/ML IJ SOLN
INTRAMUSCULAR | Status: AC
  Filled 2014-07-06: qty 5

## 2014-07-06 MED ORDER — LACTATED RINGERS IV SOLN
INTRAVENOUS | Status: DC | PRN
  Administered 2014-07-06: 08:00:00 via INTRAVENOUS

## 2014-07-06 MED ORDER — PROPOFOL INFUSION 10 MG/ML OPTIME
INTRAVENOUS | Status: DC | PRN
Start: 2014-07-06 — End: 2014-07-06
  Administered 2014-07-06: 100 ug/kg/min via INTRAVENOUS

## 2014-07-06 MED ORDER — OXYCODONE HCL 5 MG PO TABS: 5.0000 mg | ORAL_TABLET | Freq: Once | ORAL | Status: DC | PRN

## 2014-07-06 MED ORDER — OXYCODONE HCL 5 MG/5ML PO SOLN: 5.0000 mg | Freq: Once | ORAL | Status: DC | PRN

## 2014-07-06 MED ORDER — ONDANSETRON HCL 4 MG/2ML IJ SOLN
INTRAMUSCULAR | Status: DC | PRN
  Administered 2014-07-06: 4 mg via INTRAVENOUS

## 2014-07-06 MED ORDER — ARTIFICIAL TEARS OP OINT
TOPICAL_OINTMENT | OPHTHALMIC | Status: AC
  Filled 2014-07-06: qty 3.5

## 2014-07-06 MED ORDER — HYDROMORPHONE HCL 1 MG/ML IJ SOLN: 0.2500 mg | INTRAMUSCULAR | Status: DC | PRN

## 2014-07-06 MED ORDER — 0.9 % SODIUM CHLORIDE (POUR BTL) OPTIME
TOPICAL | Status: DC | PRN
  Administered 2014-07-06: 1000 mL

## 2014-07-06 MED ORDER — MIDAZOLAM HCL 5 MG/5ML IJ SOLN
INTRAMUSCULAR | Status: DC | PRN
  Administered 2014-07-06: 2 mg via INTRAVENOUS

## 2014-07-06 MED ORDER — ROPIVACAINE HCL 5 MG/ML IJ SOLN
INTRAMUSCULAR | Status: DC | PRN
  Administered 2014-07-06: 30 mL via PERINEURAL

## 2014-07-06 MED ORDER — LIDOCAINE HCL (CARDIAC) 20 MG/ML IV SOLN
INTRAVENOUS | Status: AC
  Filled 2014-07-06: qty 5

## 2014-07-06 MED ORDER — MIDAZOLAM HCL 2 MG/2ML IJ SOLN
INTRAMUSCULAR | Status: AC
  Filled 2014-07-06: qty 2

## 2014-07-06 MED ORDER — HYDROCODONE-ACETAMINOPHEN 5-325 MG PO TABS: 1.0000 | ORAL_TABLET | Freq: Four times a day (QID) | ORAL | Status: DC | PRN

## 2014-07-06 MED ORDER — PROPOFOL 10 MG/ML IV BOLUS
INTRAVENOUS | Status: AC
  Filled 2014-07-06: qty 20

## 2014-07-06 MED ORDER — STERILE WATER FOR INJECTION IJ SOLN
INTRAMUSCULAR | Status: AC
  Filled 2014-07-06: qty 10

## 2014-07-06 SURGICAL SUPPLY — 53 items
BANDAGE ELASTIC 4 VELCRO ST LF (GAUZE/BANDAGES/DRESSINGS) IMPLANT
BANDAGE ELASTIC 6 VELCRO ST LF (GAUZE/BANDAGES/DRESSINGS) IMPLANT
BANDAGE ESMARK 6X9 LF (GAUZE/BANDAGES/DRESSINGS) ×1 IMPLANT
BLADE SAW SGTL MED 73X18.5 STR (BLADE) ×2 IMPLANT
BNDG COHESIVE 4X5 TAN STRL (GAUZE/BANDAGES/DRESSINGS) ×2 IMPLANT
BNDG ESMARK 6X9 LF (GAUZE/BANDAGES/DRESSINGS) ×2
BNDG GAUZE ELAST 4 BULKY (GAUZE/BANDAGES/DRESSINGS) ×2 IMPLANT
COVER SURGICAL LIGHT HANDLE (MISCELLANEOUS) ×2 IMPLANT
CUFF TOURNIQUET SINGLE 34IN LL (TOURNIQUET CUFF) IMPLANT
CUFF TOURNIQUET SINGLE 44IN (TOURNIQUET CUFF) IMPLANT
DRAPE C-ARM 42X72 X-RAY (DRAPES) IMPLANT
DRAPE INCISE IOBAN 66X45 STRL (DRAPES) IMPLANT
DRAPE ORTHO SPLIT 77X108 STRL (DRAPES)
DRAPE SURG ORHT 6 SPLT 77X108 (DRAPES) IMPLANT
DRAPE U-SHAPE 47X51 STRL (DRAPES) ×2 IMPLANT
DRSG ADAPTIC 3X8 NADH LF (GAUZE/BANDAGES/DRESSINGS) ×2 IMPLANT
DRSG EMULSION OIL 3X3 NADH (GAUZE/BANDAGES/DRESSINGS) IMPLANT
DRSG PAD ABDOMINAL 8X10 ST (GAUZE/BANDAGES/DRESSINGS) ×4 IMPLANT
DURAPREP 26ML APPLICATOR (WOUND CARE) ×2 IMPLANT
ELECT REM PT RETURN 9FT ADLT (ELECTROSURGICAL) ×2
ELECTRODE REM PT RTRN 9FT ADLT (ELECTROSURGICAL) ×1 IMPLANT
GAUZE SPONGE 4X4 12PLY STRL (GAUZE/BANDAGES/DRESSINGS) IMPLANT
GLOVE BIO SURGEON STRL SZ 6.5 (GLOVE) ×2 IMPLANT
GLOVE BIOGEL PI IND STRL 6.5 (GLOVE) ×2 IMPLANT
GLOVE BIOGEL PI IND STRL 9 (GLOVE) ×1 IMPLANT
GLOVE BIOGEL PI INDICATOR 6.5 (GLOVE) ×2
GLOVE BIOGEL PI INDICATOR 9 (GLOVE) ×1
GLOVE ECLIPSE 7.0 STRL STRAW (GLOVE) ×2 IMPLANT
GLOVE SURG ORTHO 9.0 STRL STRW (GLOVE) ×2 IMPLANT
GOWN STRL REUS W/ TWL XL LVL3 (GOWN DISPOSABLE) ×3 IMPLANT
GOWN STRL REUS W/TWL XL LVL3 (GOWN DISPOSABLE) ×3
KIT BASIN OR (CUSTOM PROCEDURE TRAY) ×2 IMPLANT
KIT ROOM TURNOVER OR (KITS) ×2 IMPLANT
MANIFOLD NEPTUNE II (INSTRUMENTS) ×2 IMPLANT
NS IRRIG 1000ML POUR BTL (IV SOLUTION) ×2 IMPLANT
PACK ORTHO EXTREMITY (CUSTOM PROCEDURE TRAY) ×2 IMPLANT
PAD ARMBOARD 7.5X6 YLW CONV (MISCELLANEOUS) ×6 IMPLANT
PAD CAST 4YDX4 CTTN HI CHSV (CAST SUPPLIES) IMPLANT
PADDING CAST COTTON 4X4 STRL (CAST SUPPLIES)
SPONGE GAUZE 4X4 12PLY STER LF (GAUZE/BANDAGES/DRESSINGS) ×2 IMPLANT
SPONGE LAP 18X18 X RAY DECT (DISPOSABLE) ×2 IMPLANT
STAPLER VISISTAT 35W (STAPLE) IMPLANT
STOCKINETTE IMPERVIOUS 9X36 MD (GAUZE/BANDAGES/DRESSINGS) IMPLANT
STOCKINETTE IMPERVIOUS LG (DRAPES) IMPLANT
SUT ETHILON 2 0 PSLX (SUTURE) ×4 IMPLANT
SUT VIC AB 0 CT1 27 (SUTURE)
SUT VIC AB 0 CT1 27XBRD ANBCTR (SUTURE) IMPLANT
SUT VIC AB 2-0 CT1 27 (SUTURE)
SUT VIC AB 2-0 CT1 TAPERPNT 27 (SUTURE) IMPLANT
TOWEL OR 17X24 6PK STRL BLUE (TOWEL DISPOSABLE) ×2 IMPLANT
TOWEL OR 17X26 10 PK STRL BLUE (TOWEL DISPOSABLE) ×2 IMPLANT
UNDERPAD 30X30 INCONTINENT (UNDERPADS AND DIAPERS) IMPLANT
WATER STERILE IRR 1000ML POUR (IV SOLUTION) IMPLANT

## 2014-07-06 NOTE — Transfer of Care (Signed)
Immediate Anesthesia Transfer of Care Note  Patient: Alexander EdwardsDavid Pennington  Procedure(s) Performed: Procedure(s): Left 2nd and Possible 3rd Ray Amputation (Left) Removal Deep Hardware Left Foot (Left)  Patient Location: PACU  Anesthesia Type:MAC and Regional  Level of Consciousness: awake, alert  and oriented  Airway & Oxygen Therapy: Patient Spontanous Breathing  Post-op Assessment: Report given to PACU RN and Post -op Vital signs reviewed and stable  Post vital signs: Reviewed and stable  Complications: No apparent anesthesia complications

## 2014-07-06 NOTE — H&P (Signed)
Alexander EdwardsDavid Kendrick is an 53 y.o. male.   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 Patient has ulceration with osteomyelitis of the second metatarsal head. 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  Past Medical History  Diagnosis Date  . Hemorrhagic stroke     a. Dx 2010. 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 Unclear etiology. right eye damaged - has no central vision, has a little problem with balance  . Hypertension   . Neuropathy   . Murmur, heart Fall 2014 - diagnosed  . Complication of anesthesia     hx deviated septum, sleep apnea- has had problems  . Obesity     a. hx of lap band surgery.  . Sleep apnea     uses c-pap  . Diabetes mellitus     type 2  . Diabetic Charcot foot   . Kidney stones   . Family history of anesthesia complication     mom with n/Pennington    Past Surgical History  Procedure Laterality Date  . Lap band surgery      2009  . Foot surgery      foot surgery x 2  . Carpal tunnel release Left     bone removed also, nerve also cut  . I&d extremity Bilateral 06/17/2014    Procedure: IRRIGATION AND DEBRIDEMENT BILATERAL FOOT WOUNDS;  Surgeon: Kathryne Hitchhristopher Y Blackman, MD;  Location: [facility] ORS;  Service: Orthopedics;  Laterality: Bilateral;  . Vasectomy    . Colonoscopy      Family History  Problem Relation Age of Onset  . Heart attack Father    Social History:  reports that he quit smoking about 26 years ago. His smoking use included Cigarettes. He smoked 0.00 packs per day. He has never used smokeless tobacco. He reports that he drinks alcohol. He reports that he does not use illicit drugs.  Allergies:  Allergies  Allergen Reactions  . Tetanus Toxoids     "Sore  arm all the way down, Painful, red"  . Ciprofloxacin Other (See Comments)    Altered mental status  . Eggs Or Egg-Derived Products Diarrhea    Constant flu after flu shot.  . Nsaids Other (See Comments)    Cannot take nsaids-  etc due to heart   . Other     All betablockers make him nauseated ("vertical vomiting")  . Statins Other (See Comments)    York SpanielSaid it causes pain?    Medications Prior to Admission  Medication Sig Dispense Refill  . celecoxib (CELEBREX) 200 MG capsule Take 200 mg by mouth 2 (two) times daily.      . cephALEXin (KEFLEX) 500 MG capsule Take 500 mg by mouth 4 (four) times daily.      [name]. ezetimibe (ZETIA) 10 MG tablet Take 10 mg by mouth daily.      . Folic Acid-Vit B6-Vit B12 ([name]) 2.5-25-1 MG TABS tablet Take 1 tablet by mouth daily.      [name]. gabapentin (NEURONTIN) 600 MG tablet Take 1,200 mg by mouth 3 (three) times daily.      [name]. HYDROcodone-acetaminophen (NORCO/VICODIN) 5-325 MG per tablet Take 1 tablet by mouth every 6 (six) hours as needed for moderate pain.  30 tablet  0  . metFORMIN (GLUMETZA) 1000 MG (MOD) 24 hr tablet Take 1,000 mg by mouth 2 (two) times daily with a meal.       . Multiple Vitamins tablet Take 1 tablet by mouth daily.      . Testosterone (ANDROGEL) 20.25 MG/1.25GM (1.62%) GEL Place 2 Squirts onto the skin daily.      . traMADol (ULTRAM) 50 MG tablet Take 100 mg by mouth 3 (three) times daily.      . valsartan (DIOVAN) 320 MG tablet Take 320 mg by mouth daily.        No results found for this or any previous visit (from the past 48 hour(s)). No results found.  Review of Systems  All other systems reviewed and are negative.   There were no vitals taken for this visit. Physical Exam  On examination patient has a palpable dorsalis pedis pulse there is osteomyelitis of the second metatarsal head with ulceration. There is prominent failed hardware of the great toe MTP joint fusion.  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  Plan: Will plan for removal of deep hardware left great toe. Will plan for amputation of the second and third rays. Risks and benefits were discussed patient states he understands was to proceed at this time.  Alexander Pennington 07/06/2014, 6:39 AM

## 2014-07-06 NOTE — Anesthesia Postprocedure Evaluation (Signed)
  Anesthesia Post-op Note  Patient: Alexander EdwardsDavid Pennington  Procedure(s) Performed: Procedure(s): Left 2nd and Possible 3rd Ray Amputation (Left) Removal Deep Hardware Left Foot (Left)  Patient Location: PACU  Anesthesia Type:MAC and Regional  Level of Consciousness: awake, alert  and oriented  Airway and Oxygen Therapy: Patient Spontanous Breathing  Post-op Pain: none  Post-op Assessment: Post-op Vital signs reviewed, Patient's Cardiovascular Status Stable, Respiratory Function Stable, Patent Airway and No signs of Nausea or vomiting  Post-op Vital Signs: Reviewed and stable  Last Vitals:  Filed Vitals:   07/06/14 0945  BP: 112/68  Pulse: 71  Temp:   Resp: 15    Complications: No apparent anesthesia complications

## 2014-07-06 NOTE — Anesthesia Procedure Notes (Addendum)
Procedure Name: MAC Date/Time: 07/06/2014 8:28 AM Performed by: Sheppard EvensMANESS, CARRIE B Pre-anesthesia Checklist: Patient identified, Emergency Drugs available, Suction available, Patient being monitored and Timeout performed Patient Re-evaluated:Patient Re-evaluated prior to inductionOxygen Delivery Method: Nasal cannula   Anesthesia Regional Block:  Popliteal block  Pre-Anesthetic Checklist: ,, timeout performed, Correct Patient, Correct Site, Correct Laterality, Correct Procedure, Correct Position, site marked, Risks and benefits discussed,  Surgical consent,  Pre-op evaluation,  At surgeon's request and post-op pain management  Laterality: Lower and Left  Prep: chloraprep       Needles:  Injection technique: Single-shot  Needle Type: Echogenic Stimulator Needle          Additional Needles:  Procedures: ultrasound guided (picture in chart) and nerve stimulator Popliteal block  Nerve Stimulator or Paresthesia:  Response: eversion, 0.5 mA,   Additional Responses:   Narrative:  Start time: 07/06/2014 8:14 AM End time: 07/06/2014 8:19 AM Injection made incrementally with aspirations every 5 mL.  Performed by: Personally  Anesthesiologist: Julius Boniface  Additional Notes: H+P and labs reviewed, risks and benefits discussed with patient, procedure tolerated well without complications

## 2014-07-06 NOTE — Op Note (Signed)
07/06/2014  9:11 AM  PATIENT:  Alexander Pennington    PRE-OPERATIVE DIAGNOSIS:  Osteomyelitis 2nd MT Left Foot and Retained Hardware great toe  POST-OPERATIVE DIAGNOSIS:  Same  PROCEDURE:  Left 2nd and  3rd Ray Amputation, Removal Deep Hardware Left Foot first metatarsal  SURGEON:  Nadara MustardUDA,Lesleyanne Politte V, MD  PHYSICIAN ASSISTANT:None ANESTHESIA:   General  PREOPERATIVE INDICATIONS:  Alexander Pennington is a  53 y.o. male with a diagnosis of Osteomyelitis 2nd MT Left Foot and Retained Hardware who failed conservative measures and elected for surgical management.    The risks benefits and alternatives were discussed with the patient preoperatively including but not limited to the risks of infection, bleeding, nerve injury, cardiopulmonary complications, the need for revision surgery, among others, and the patient was willing to proceed.  OPERATIVE IMPLANTS: None  OPERATIVE FINDINGS: Destructive changes of the second and third metatarsal from chronic osteomyelitis  OPERATIVE PROCEDURE: Patient was brought to the operating room after undergoing a popliteal block he then underwent MAC anesthetic. After levels of anesthesia were obtained patient's left lower extremity was prepped using DuraPrep draped into a sterile field a time out was called. A racquet incision was made around the second and third toes. A resection of the second and third rays was performed to the mid shaft of the second and third metatarsals. The necrotic soft tissue was removed there is no purulent abscess. The wound was irrigated with normal saline. Hemostasis was obtained. The incision was closed using 2-0 nylon. Attention was focused on the deep retained hardware left great toe. Incision was made over the screw. The screw was removed without complications. The wound was irrigated. The incision was closed in 2-0 nylon. A sterile compressive dressing was applied. Patient was taken to the PACU in stable condition plan for discharge to home.

## 2014-07-06 NOTE — Progress Notes (Signed)
Called dr Maple Hudsonmoser for sign out states he will sign pt out may d/c to ss

## 2014-07-06 NOTE — Discharge Instructions (Signed)
Keep dressing clean and dry. Minimize weightbearing left foot. Keep foot elevated above the heart at all times.  What to eat:  For your first meals, you should eat lightly; only small meals initially.  If you do not have nausea, you may eat larger meals.  Avoid spicy, greasy and heavy food.    General Anesthesia, Adult, Care After  Refer to this sheet in the next few weeks. These instructions provide you with information on caring for yourself after your procedure. Your health care provider may also give you more specific instructions. Your treatment has been planned according to current medical practices, but problems sometimes occur. Call your health care provider if you have any problems or questions after your procedure.  WHAT TO EXPECT AFTER THE PROCEDURE  After the procedure, it is typical to experience:  Sleepiness.  Nausea and vomiting. HOME CARE INSTRUCTIONS  For the first 24 hours after general anesthesia:  Have a responsible person with you.  Do not drive a car. If you are alone, do not take public transportation.  Do not drink alcohol.  Do not take medicine that has not been prescribed by your health care provider.  Do not sign important papers or make important decisions.  You may resume a normal diet and activities as directed by your health care provider.  Change bandages (dressings) as directed.  If you have questions or problems that seem related to general anesthesia, call the hospital and ask for the anesthetist or anesthesiologist on call. SEEK MEDICAL CARE IF:  You have nausea and vomiting that continue the day after anesthesia.  You develop a rash. SEEK IMMEDIATE MEDICAL CARE IF:  You have difficulty breathing.  You have chest pain.  You have any allergic problems. Document Released: 12/09/2000 Document Revised: 05/05/2013 Document Reviewed: 03/18/2013  [facility] Patient Information 2014 [place], [place].

## 2014-07-06 NOTE — Anesthesia Preprocedure Evaluation (Addendum)
Anesthesia Evaluation  Patient identified by MRN, date of birth, ID band Patient awake    Reviewed: Allergy & Precautions, H&P , NPO status , Patient's Chart, lab work & pertinent test results  History of Anesthesia Complications Negative for: history of anesthetic complications  Airway Mallampati: III TM Distance: >3 FB Neck ROM: Full    Dental  (+) Teeth Intact   Pulmonary sleep apnea and Continuous Positive Airway Pressure Ventilation , former smoker,  breath sounds clear to auscultation        Cardiovascular hypertension, Pt. on medications - angina+ CAD and +CHF - Past MI and - Cardiac Stents - dysrhythmias Rhythm:Regular     Neuro/Psych negative neurological ROS  negative psych ROS   GI/Hepatic negative GI ROS, Neg liver ROS, H/o lap band   Endo/Other  diabetes, Type 2, Oral Hypoglycemic AgentsMorbid obesity  Renal/GU negative Renal ROS     Musculoskeletal  (+) Arthritis -, Osteoarthritis,    Abdominal   Peds  Hematology negative hematology ROS (+)   Anesthesia Other Findings LCX-OM branch stenosis (75%) probably corresponds to the inferolateral perfusion abnormality. Agree with aggressive medical Rx.   Reproductive/Obstetrics                         Anesthesia Physical Anesthesia Plan  ASA: III  Anesthesia Plan: MAC and Regional   Post-op Pain Management:    Induction: Intravenous  Airway Management Planned: Natural Airway and Simple Face Mask  Additional Equipment: None  Intra-op Plan:   Post-operative Plan:   Informed Consent: I have reviewed the patients History and Physical, chart, labs and discussed the procedure including the risks, benefits and alternatives for the proposed anesthesia with the patient or authorized representative who has indicated his/her understanding and acceptance.   Dental advisory given  Plan Discussed with: CRNA, Surgeon and  Anesthesiologist  Anesthesia Plan Comments:        Anesthesia Quick Evaluation

## 2014-07-06 NOTE — Addendum Note (Signed)
Addendum created 07/06/14 1138 by Corky Soxhris Sawyer Mentzer, MD   Modules edited: Anesthesia Attestations, Anesthesia Blocks and Procedures, Anesthesia Medication Administration, Clinical Notes   Clinical Notes:  File: [id number]

## 2014-07-08 ENCOUNTER — Encounter (HOSPITAL_COMMUNITY): Payer: Self-pay | Admitting: Orthopedic Surgery

## 2014-08-10 ENCOUNTER — Ambulatory Visit: Payer: BC Managed Care – PPO | Admitting: Cardiovascular Disease

## 2014-08-16 ENCOUNTER — Ambulatory Visit (INDEPENDENT_AMBULATORY_CARE_PROVIDER_SITE_OTHER): Payer: BC Managed Care – PPO | Admitting: Cardiovascular Disease

## 2014-08-16 ENCOUNTER — Encounter: Payer: Self-pay | Admitting: Cardiovascular Disease

## 2014-08-16 ENCOUNTER — Telehealth: Payer: Self-pay | Admitting: Cardiovascular Disease

## 2014-08-16 VITALS — BP 150/80 | HR 84 | Ht 69.0 in | Wt 257.0 lb

## 2014-08-16 DIAGNOSIS — I2510 Atherosclerotic heart disease of native coronary artery without angina pectoris: Secondary | ICD-10-CM | POA: Insufficient documentation

## 2014-08-16 DIAGNOSIS — E785 Hyperlipidemia, unspecified: Secondary | ICD-10-CM

## 2014-08-16 DIAGNOSIS — I25700 Atherosclerosis of coronary artery bypass graft(s), unspecified, with unstable angina pectoris: Secondary | ICD-10-CM

## 2014-08-16 DIAGNOSIS — Z79899 Other long term (current) drug therapy: Secondary | ICD-10-CM

## 2014-08-16 DIAGNOSIS — R011 Cardiac murmur, unspecified: Secondary | ICD-10-CM

## 2014-08-16 DIAGNOSIS — E669 Obesity, unspecified: Secondary | ICD-10-CM

## 2014-08-16 DIAGNOSIS — G4730 Sleep apnea, unspecified: Secondary | ICD-10-CM

## 2014-08-16 MED ORDER — COENZYME Q10 200 MG PO CAPS: 200.0000 mg | ORAL_CAPSULE | Freq: Every day | ORAL | Status: AC

## 2014-08-16 MED ORDER — PRAVASTATIN SODIUM 20 MG PO TABS: 20.0000 mg | ORAL_TABLET | Freq: Every evening | ORAL | Status: DC

## 2014-08-16 NOTE — Assessment & Plan Note (Signed)
On C Pap 

## 2014-08-16 NOTE — Progress Notes (Signed)
08/16/2014 Alexander Pennington   10/07/1960  [id number]  Primary Physician PROVIDER NOT IN SYSTEM Primary Cardiologist: Runell GessJonathan J. Edd Reppert MD Roseanne RenoFACP,FACC,FAHA, FSCAI   HPI:  Mr. 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 His cardiac risk factor profile is positive for diabetes treated for the last 15 years with Charcot foot, hypertension and hyperlipidemia. He's had a hemorrhagic stroke back in 2010 but never had a myocardial infarction. His other problems include morbid obesity status post lap band surgery with resulting decrease in his weight of 167 pounds. He does have obstructive sleep apnea on sleep. He was seen in the hospital for preoperative clearance before. Surgery performed by Dr. Horton Chinuda.a Myoview stress test revealed inferolateral ischemia and assessment cardiac catheterization performed Dr. Excell Seltzerooper revealed a 75% stenosis and obtuse marginal branch which elected to treat medically. His most recent lipid profile on Zetia performed 08/10/14 revealed a total cholesterol of 159, LDL of 93 and HDL of 37. He is somewhat statin intolerance, but apparently has been told that he can take pravastatin.   Current Outpatient Prescriptions  Medication Sig Dispense Refill  . celecoxib (CELEBREX) 200 MG capsule Take 200 mg by mouth 2 (two) times daily.    [name]. ezetimibe (ZETIA) 10 MG tablet Take 10 mg by mouth daily.    . Folic Acid-Vit B6-Vit B12 ([name]) 2.5-25-1 MG TABS tablet Take 1 tablet by mouth daily.    [name]. gabapentin (NEURONTIN) 600 MG tablet Take 1,200 mg by mouth 3 (three) times daily.    [name]. HYDROcodone-acetaminophen (NORCO) 5-325 MG per tablet Take 1 tablet by mouth every 6 (six) hours as needed. 60 tablet 0  . HYDROcodone-acetaminophen (NORCO/VICODIN) 5-325 MG per tablet Take 1 tablet by mouth every 6 (six) hours as needed for moderate pain. 30 tablet 0  . metFORMIN (GLUMETZA) 1000 MG  (MOD) 24 hr tablet Take 1,000 mg by mouth 2 (two) times daily with a meal.     . Multiple Vitamins tablet Take 1 tablet by mouth daily.    . Testosterone (ANDROGEL) 20.25 MG/1.25GM (1.62%) GEL Place 2 Squirts onto the skin daily.    . traMADol (ULTRAM) 50 MG tablet Take 100 mg by mouth 3 (three) times daily.    . valsartan (DIOVAN) 320 MG tablet Take 320 mg by mouth daily.    . Coenzyme Q10 200 MG capsule Take 1 capsule (200 mg total) by mouth daily.    . pravastatin (PRAVACHOL) 20 MG tablet Take 1 tablet (20 mg total) by mouth every evening. 30 tablet 6   No current facility-administered medications for this visit.    Allergies  Allergen Reactions  . Beta Adrenergic Blockers Nausea And Vomiting  . Tetanus Toxoids     "Sore arm all the way down, Painful, red"  . Ciprofloxacin Other (See Comments)    Altered mental status  . Eggs Or Egg-Derived Products Diarrhea    Constant flu after flu shot.  . Labetalol Hcl Nausea Only  . Lansoprazole Other (See Comments)    Agitation  . Nsaids Other (See Comments)    Cannot take nsaids-  etc due to heart   . Other     All betablockers make him nauseated ("vertical vomiting")  . Promethazine Other (See Comments)    Sedated  . Statins Other (See Comments)    York SpanielSaid it causes pain?    History   Social History  . Marital Status:  Single    Spouse Name: N/A    Number of Children: N/A  . Years of Education: N/A   Occupational History  . Not on file.   Social History Main Topics  . Smoking status: Former Smoker    Types: Cigarettes    Quit date: 06/13/1988  . Smokeless tobacco: Never Used     Comment: Smoked for ~8 yrs.  . Alcohol Use: Yes     Comment: three times a week, one 12-oz cider  . Drug Use: No  . Sexual Activity: Not on file   Other Topics Concern  . Not on file   Social History Narrative     Review of Systems: General: negative for chills, fever, night sweats or weight changes.  Cardiovascular: negative for chest  pain, dyspnea on exertion, edema, orthopnea, palpitations, paroxysmal nocturnal dyspnea or shortness of breath Dermatological: negative for rash Respiratory: negative for cough or wheezing Urologic: negative for hematuria Abdominal: negative for nausea, vomiting, diarrhea, bright red blood per rectum, melena, or hematemesis Neurologic: negative for visual changes, syncope, or dizziness All other systems reviewed and are otherwise negative except as noted above.    Blood pressure 150/80, pulse 84, height 5\' 9"  (1.753 m), weight 257 lb (116.574 kg).  General appearance: alert and no distress Neck: no adenopathy, no carotid bruit, no JVD, supple, symmetrical, trachea midline and thyroid not enlarged, symmetric, no tenderness/mass/nodules Lungs: clear to auscultation bilaterally Heart: regular rate and rhythm, S1, S2 normal, no murmur, click, rub or gallop Extremities: extremities normal, atraumatic, no cyanosis or edema  EKG not performed today  ASSESSMENT AND PLAN:   Hypertension History of hypertension with blood pressure measured today 150/80. He is on valsartan 320 mg a day. Continue current medications at current dosing  Heart murmur Recent 2-D echocardiogram performed 06/13/14 revealed an ejection fraction of 45-50% with grade 1 diastolic dysfunction and aortic sclerosis without stenosis.  Obesity History of morbid obesity with remote LAP-BAND surgery. He has lost a total of 167 pounds.  Sleep apnea On C Pap  CAD (coronary artery disease) of artery bypass graft History of CAD by cardiac catheterization revealing a 75% circumflex obtuse marginal branch stenosis which corresponded to the inferior lateral perfusion abnormality  on Myoview stress testing. The cardiac catheterization was performed by Dr. Tonny BollmanMichael Cooper. Medical therapy was recommended since the patient was asymptomatic.      Runell GessJonathan J. Laurine Kuyper MD FACP,FACC,FAHA, [facility]SCAI 08/16/2014 12:22 PM

## 2014-08-16 NOTE — Assessment & Plan Note (Signed)
History of CAD by cardiac catheterization revealing a 75% circumflex obtuse marginal branch stenosis which corresponded to the inferior lateral perfusion abnormality  on Myoview stress testing. The cardiac catheterization was performed by Dr. Tonny BollmanMichael Cooper. Medical therapy was recommended since the patient was asymptomatic.

## 2014-08-16 NOTE — Assessment & Plan Note (Signed)
History of morbid obesity with remote LAP-BAND surgery. He has lost a total of 167 pounds.

## 2014-08-16 NOTE — Patient Instructions (Signed)
Dr. Allyson SabalBerry would like you to have:  -some FASTING (No food or beverage) blood work completed in 2 months (Lab slips will be given to you)  -START taking  1. Pravastatin 20 MG Daily (this has been called in to your preferred pharmacy) 2. CoQ 10 Enzyme 200 MG (this can be found in the pharmacy, over the counter)  Your physician wants you to follow-up in 6 months with Dr. Allyson SabalBerry.. You will receive a reminder letter in the mail 2 months in advance. If you do not receive a letter, please call our office to schedule the follow-up appointment.

## 2014-08-16 NOTE — Assessment & Plan Note (Signed)
History of hypertension with blood pressure measured today 150/80. He is on valsartan 320 mg a day. Continue current medications at current dosing

## 2014-08-16 NOTE — Telephone Encounter (Signed)
[facility] Dr Allyson SabalBerry.  Faxed 08/16/14 lp

## 2014-08-16 NOTE — Assessment & Plan Note (Signed)
Recent 2-D echocardiogram performed 06/13/14 revealed an ejection fraction of 45-50% with grade 1 diastolic dysfunction and aortic sclerosis without stenosis.

## 2014-08-25 ENCOUNTER — Encounter (HOSPITAL_COMMUNITY): Payer: Self-pay | Admitting: Cardiovascular Disease

## 2014-08-26 NOTE — Telephone Encounter (Signed)
[facility]  Given to Dr Allyson SabalBerry for review. lp

## 2014-09-19 ENCOUNTER — Other Ambulatory Visit (HOSPITAL_COMMUNITY): Payer: Self-pay | Admitting: Orthopedic Surgery

## 2014-09-22 ENCOUNTER — Encounter (HOSPITAL_COMMUNITY): Payer: Self-pay | Admitting: *Deleted

## 2014-09-22 MED ORDER — CEFAZOLIN SODIUM-DEXTROSE 2-3 GM-% IV SOLR
2.0000 g | INTRAVENOUS | Status: AC
  Administered 2014-09-23: 2 g via INTRAVENOUS
  Filled 2014-09-22: qty 50

## 2014-09-23 ENCOUNTER — Ambulatory Visit (HOSPITAL_COMMUNITY): Payer: BLUE CROSS/BLUE SHIELD | Admitting: Anesthesiology

## 2014-09-23 ENCOUNTER — Ambulatory Visit (HOSPITAL_COMMUNITY)
Admission: RE | Admit: 2014-09-23 | Discharge: 2014-09-23 | Disposition: A | Discharge status: Home / Self Care | Payer: BLUE CROSS/BLUE SHIELD | Source: Ambulatory Visit | Attending: Orthopedic Surgery | Admitting: Orthopedic Surgery

## 2014-09-23 ENCOUNTER — Encounter (HOSPITAL_COMMUNITY)
Admission: RE | Disposition: A | Discharge status: Home / Self Care | Payer: Self-pay | Source: Ambulatory Visit | Attending: Orthopedic Surgery

## 2014-09-23 ENCOUNTER — Encounter (HOSPITAL_COMMUNITY): Payer: Self-pay | Admitting: *Deleted

## 2014-09-23 DIAGNOSIS — Z6839 Body mass index (BMI) 39.0-39.9, adult: Secondary | ICD-10-CM | POA: Insufficient documentation

## 2014-09-23 DIAGNOSIS — Z87891 Personal history of nicotine dependence: Secondary | ICD-10-CM | POA: Insufficient documentation

## 2014-09-23 DIAGNOSIS — L97529 Non-pressure chronic ulcer of other part of left foot with unspecified severity: Secondary | ICD-10-CM | POA: Insufficient documentation

## 2014-09-23 DIAGNOSIS — Z91012 Allergy to eggs: Secondary | ICD-10-CM | POA: Insufficient documentation

## 2014-09-23 DIAGNOSIS — E1151 Type 2 diabetes mellitus with diabetic peripheral angiopathy without gangrene: Secondary | ICD-10-CM | POA: Insufficient documentation

## 2014-09-23 DIAGNOSIS — Z87442 Personal history of urinary calculi: Secondary | ICD-10-CM | POA: Insufficient documentation

## 2014-09-23 DIAGNOSIS — I10 Essential (primary) hypertension: Secondary | ICD-10-CM | POA: Insufficient documentation

## 2014-09-23 DIAGNOSIS — E669 Obesity, unspecified: Secondary | ICD-10-CM | POA: Insufficient documentation

## 2014-09-23 DIAGNOSIS — I2510 Atherosclerotic heart disease of native coronary artery without angina pectoris: Secondary | ICD-10-CM | POA: Insufficient documentation

## 2014-09-23 DIAGNOSIS — G4730 Sleep apnea, unspecified: Secondary | ICD-10-CM | POA: Insufficient documentation

## 2014-09-23 DIAGNOSIS — Z888 Allergy status to other drugs, medicaments and biological substances status: Secondary | ICD-10-CM | POA: Diagnosis not present

## 2014-09-23 DIAGNOSIS — Z887 Allergy status to serum and vaccine status: Secondary | ICD-10-CM | POA: Insufficient documentation

## 2014-09-23 DIAGNOSIS — E785 Hyperlipidemia, unspecified: Secondary | ICD-10-CM | POA: Insufficient documentation

## 2014-09-23 DIAGNOSIS — Z9884 Bariatric surgery status: Secondary | ICD-10-CM | POA: Diagnosis not present

## 2014-09-23 DIAGNOSIS — M869 Osteomyelitis, unspecified: Secondary | ICD-10-CM

## 2014-09-23 HISTORY — PX: AMPUTATION: SHX166

## 2014-09-23 LAB — COMPREHENSIVE METABOLIC PANEL
ALT: 15 U/L (ref 0–53)
ANION GAP: 12 (ref 5–15)
AST: 25 U/L (ref 0–37)
Albumin: 4.3 g/dL (ref 3.5–5.2)
Alkaline Phosphatase: 77 U/L (ref 39–117)
BUN: 17 mg/dL (ref 6–23)
CHLORIDE: 103 meq/L (ref 96–112)
CO2: 22 mmol/L (ref 19–32)
CREATININE: 0.58 mg/dL (ref 0.50–1.35)
Calcium: 9.4 mg/dL (ref 8.4–10.5)
GLUCOSE: 144 mg/dL — AB (ref 70–99)
POTASSIUM: 4.4 mmol/L (ref 3.5–5.1)
Sodium: 137 mmol/L (ref 135–145)
Total Bilirubin: 0.8 mg/dL (ref 0.3–1.2)
Total Protein: 7.3 g/dL (ref 6.0–8.3)

## 2014-09-23 LAB — GLUCOSE, CAPILLARY
GLUCOSE-CAPILLARY: 149 mg/dL — AB (ref 70–99)
GLUCOSE-CAPILLARY: 121 mg/dL — AB (ref 70–99)
GLUCOSE-CAPILLARY: 99 mg/dL (ref 70–99)

## 2014-09-23 LAB — PROTIME-INR
INR: 1.02 (ref 0.00–1.49)
Prothrombin Time: 13.5 seconds (ref 11.6–15.2)

## 2014-09-23 LAB — CBC
HCT: 45.1 % (ref 39.0–52.0)
Hemoglobin: 15.2 g/dL (ref 13.0–17.0)
MCH: 28.1 pg (ref 26.0–34.0)
MCHC: 33.7 g/dL (ref 30.0–36.0)
MCV: 83.4 fL (ref 78.0–100.0)
PLATELETS: 175 10*3/uL (ref 150–400)
RBC: 5.41 MIL/uL (ref 4.22–5.81)
RDW: 13.8 % (ref 11.5–15.5)
WBC: 7.4 10*3/uL (ref 4.0–10.5)

## 2014-09-23 LAB — APTT: APTT: 33 s (ref 24–37)

## 2014-09-23 SURGERY — AMPUTATION, FOOT, RAY
Anesthesia: General | Site: Foot | Laterality: Left

## 2014-09-23 MED ORDER — MIDAZOLAM HCL 2 MG/2ML IJ SOLN
1.0000 mg | INTRAMUSCULAR | Status: DC | PRN
  Administered 2014-09-23: 2 mg via INTRAVENOUS

## 2014-09-23 MED ORDER — MIDAZOLAM HCL 2 MG/2ML IJ SOLN
INTRAMUSCULAR | Status: AC
  Administered 2014-09-23: 2 mg via INTRAVENOUS
  Filled 2014-09-23: qty 2

## 2014-09-23 MED ORDER — LIDOCAINE HCL (CARDIAC) 20 MG/ML IV SOLN
INTRAVENOUS | Status: AC
  Filled 2014-09-23: qty 5

## 2014-09-23 MED ORDER — MIDAZOLAM HCL 2 MG/2ML IJ SOLN
INTRAMUSCULAR | Status: AC
  Filled 2014-09-23: qty 2

## 2014-09-23 MED ORDER — FENTANYL CITRATE 0.05 MG/ML IJ SOLN: 25.0000 ug | INTRAMUSCULAR | Status: DC | PRN

## 2014-09-23 MED ORDER — PROPOFOL 10 MG/ML IV BOLUS
INTRAVENOUS | Status: AC
  Filled 2014-09-23: qty 20

## 2014-09-23 MED ORDER — FENTANYL CITRATE 0.05 MG/ML IJ SOLN
INTRAMUSCULAR | Status: AC
  Administered 2014-09-23: 100 ug via INTRAVENOUS
  Filled 2014-09-23: qty 2

## 2014-09-23 MED ORDER — LACTATED RINGERS IV SOLN
INTRAVENOUS | Status: DC
  Administered 2014-09-23: 14:00:00 via INTRAVENOUS

## 2014-09-23 MED ORDER — 0.9 % SODIUM CHLORIDE (POUR BTL) OPTIME
TOPICAL | Status: DC | PRN
Start: 2014-09-23 — End: 2014-09-23
  Administered 2014-09-23: 1000 mL

## 2014-09-23 MED ORDER — FENTANYL CITRATE 0.05 MG/ML IJ SOLN
50.0000 ug | INTRAMUSCULAR | Status: DC | PRN
  Administered 2014-09-23: 100 ug via INTRAVENOUS

## 2014-09-23 MED ORDER — ONDANSETRON HCL 4 MG/2ML IJ SOLN: 4.0000 mg | Freq: Four times a day (QID) | INTRAMUSCULAR | Status: DC | PRN

## 2014-09-23 MED ORDER — ONDANSETRON HCL 4 MG/2ML IJ SOLN
INTRAMUSCULAR | Status: AC
  Filled 2014-09-23: qty 2

## 2014-09-23 MED ORDER — SUCCINYLCHOLINE CHLORIDE 20 MG/ML IJ SOLN
INTRAMUSCULAR | Status: AC
  Filled 2014-09-23: qty 1

## 2014-09-23 MED ORDER — LACTATED RINGERS IV SOLN
INTRAVENOUS | Status: DC | PRN
  Administered 2014-09-23 (×2): via INTRAVENOUS

## 2014-09-23 MED ORDER — BUPIVACAINE-EPINEPHRINE (PF) 0.5% -1:200000 IJ SOLN
INTRAMUSCULAR | Status: DC | PRN
  Administered 2014-09-23: 30 mL via PERINEURAL

## 2014-09-23 MED ORDER — PROPOFOL 10 MG/ML IV BOLUS
INTRAVENOUS | Status: DC | PRN
  Administered 2014-09-23 (×3): 50 mg via INTRAVENOUS

## 2014-09-23 MED ORDER — OXYCODONE-ACETAMINOPHEN 5-325 MG PO TABS: 1.0000 | ORAL_TABLET | ORAL | Status: DC | PRN

## 2014-09-23 SURGICAL SUPPLY — 36 items
BLADE SAW SGTL MED 73X18.5 STR (BLADE) ×2 IMPLANT
BNDG COHESIVE 4X5 TAN STRL (GAUZE/BANDAGES/DRESSINGS) ×2 IMPLANT
BNDG GAUZE ELAST 4 BULKY (GAUZE/BANDAGES/DRESSINGS) ×2 IMPLANT
COVER SURGICAL LIGHT HANDLE (MISCELLANEOUS) ×2 IMPLANT
DRAPE U-SHAPE 47X51 STRL (DRAPES) ×2 IMPLANT
DRSG ADAPTIC 3X8 NADH LF (GAUZE/BANDAGES/DRESSINGS) ×2 IMPLANT
DRSG PAD ABDOMINAL 8X10 ST (GAUZE/BANDAGES/DRESSINGS) ×2 IMPLANT
DURAPREP 26ML APPLICATOR (WOUND CARE) ×2 IMPLANT
ELECT REM PT RETURN 9FT ADLT (ELECTROSURGICAL) ×2
ELECTRODE REM PT RTRN 9FT ADLT (ELECTROSURGICAL) ×1 IMPLANT
GAUZE SPONGE 4X4 12PLY STRL (GAUZE/BANDAGES/DRESSINGS) ×2 IMPLANT
GLOVE BIO SURGEON STRL SZ 6.5 (GLOVE) ×2 IMPLANT
GLOVE BIOGEL PI IND STRL 7.0 (GLOVE) ×2 IMPLANT
GLOVE BIOGEL PI IND STRL 7.5 (GLOVE) ×1 IMPLANT
GLOVE BIOGEL PI IND STRL 9 (GLOVE) ×1 IMPLANT
GLOVE BIOGEL PI INDICATOR 7.0 (GLOVE) ×2
GLOVE BIOGEL PI INDICATOR 7.5 (GLOVE) ×1
GLOVE BIOGEL PI INDICATOR 9 (GLOVE) ×1
GLOVE SURG ORTHO 9.0 STRL STRW (GLOVE) ×2 IMPLANT
GLOVE SURG SS PI 6.5 STRL IVOR (GLOVE) ×2 IMPLANT
GLOVE SURG SS PI 7.0 STRL IVOR (GLOVE) ×2 IMPLANT
GOWN STRL REUS W/ TWL LRG LVL3 (GOWN DISPOSABLE) ×1 IMPLANT
GOWN STRL REUS W/ TWL XL LVL3 (GOWN DISPOSABLE) ×3 IMPLANT
GOWN STRL REUS W/TWL LRG LVL3 (GOWN DISPOSABLE) ×1
GOWN STRL REUS W/TWL XL LVL3 (GOWN DISPOSABLE) ×3
KIT BASIN OR (CUSTOM PROCEDURE TRAY) ×2 IMPLANT
KIT ROOM TURNOVER OR (KITS) ×2 IMPLANT
NS IRRIG 1000ML POUR BTL (IV SOLUTION) ×2 IMPLANT
PACK ORTHO EXTREMITY (CUSTOM PROCEDURE TRAY) ×2 IMPLANT
PAD ARMBOARD 7.5X6 YLW CONV (MISCELLANEOUS) ×2 IMPLANT
SPONGE LAP 18X18 X RAY DECT (DISPOSABLE) ×2 IMPLANT
STOCKINETTE IMPERVIOUS LG (DRAPES) IMPLANT
SUT ETHILON 2 0 PSLX (SUTURE) ×2 IMPLANT
TOWEL OR 17X24 6PK STRL BLUE (TOWEL DISPOSABLE) ×2 IMPLANT
TOWEL OR 17X26 10 PK STRL BLUE (TOWEL DISPOSABLE) ×2 IMPLANT
UNDERPAD 30X30 INCONTINENT (UNDERPADS AND DIAPERS) IMPLANT

## 2014-09-23 NOTE — Transfer of Care (Signed)
Immediate Anesthesia Transfer of Care Note  Patient: Alexander EdwardsDavid Pennington  Procedure(s) Performed: Procedure(s): Foot 5th Ray Amputation (Left)  Patient Location: PACU  Anesthesia Type:MAC  Level of Consciousness: awake, alert  and oriented  Airway & Oxygen Therapy: Patient Spontanous Breathing and Patient connected to nasal cannula oxygen  Post-op Assessment: Report given to PACU RN, Post -op Vital signs reviewed and stable and Patient moving all extremities  Post vital signs: Reviewed and stable  Complications: No apparent anesthesia complications

## 2014-09-23 NOTE — H&P (Signed)
Alexander Pennington is an 54 y.o. male.   Chief Complaint: Osteomyelitis ulceration left foot fifth metatarsal HPI: Patient is a 54 year old gentleman with diabetes peripheral vascular disease who has failed conservative wound care with Charcot arthropathy and ulceration osteomyelitis left foot fifth metatarsal  Past Medical History  Diagnosis Date  . Hemorrhagic stroke     a. Dx 2010. 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 Unclear etiology. right eye damaged - has no central vision, has a little problem with balance  . Hypertension   . Neuropathy   . Murmur, heart Fall 2014 - diagnosed  . Complication of anesthesia     hx deviated septum, sleep apnea- has had problems  . Obesity     a. hx of lap band surgery.  . Sleep apnea     uses c-pap  . Diabetes mellitus     type 2  . Diabetic Charcot foot   . Kidney stones   . Family history of anesthesia complication     mom with n/v  . CAD (coronary artery disease)   . Hyperlipidemia     Past Surgical History  Procedure Laterality Date  . Lap band surgery      2009  . Foot surgery      foot surgery x 2  . Carpal tunnel release Left     bone removed also, nerve also cut  . I&d extremity Bilateral 06/17/2014    Procedure: IRRIGATION AND DEBRIDEMENT BILATERAL FOOT WOUNDS;  Surgeon: Kathryne Hitch, MD;  Location: [facility] ORS;  Service: Orthopedics;  Laterality: Bilateral;  . Vasectomy    . Colonoscopy    . Amputation Left 07/06/2014    Procedure: Left 2nd and Possible 3rd Ray Amputation;  Surgeon: Nadara Mustard, MD;  Location[facility] OR;  Service: Orthopedics;  Laterality: Left;  . Hardware removal Left 07/06/2014    Procedure: Removal Deep Hardware Left Foot;  Surgeon: Nadara Mustard, MD;  Location: [facility] OR;  Service: Orthopedics;  Laterality: Left;  . Left heart catheterization with coronary angiogram N/A 06/16/2014    Procedure: LEFT HEART CATHETERIZATION WITH CORONARY ANGIOGRAM;  Surgeon: Micheline Chapman, MD;  Location[facility] C[facility] LAB;  Service:  Cardiovascular;  Laterality: N/A;    Family History  Problem Relation Age of Onset  . Heart attack Father    Social History:  reports that he quit smoking about 26 years ago. His smoking use included Cigarettes. He smoked 0.00 packs per day. He has never used smokeless tobacco. He reports that he drinks alcohol. He reports that he does not use illicit drugs.  Allergies:  Allergies  Allergen Reactions  . Beta Adrenergic Blockers Nausea And Vomiting  . Tetanus Toxoids     "Sore arm all the way down, Painful, red"  . Ciprofloxacin Other (See Comments)    Altered mental status  . Eggs Or Egg-Derived Products Diarrhea    Constant flu after flu shot.  . Labetalol Hcl Nausea Only  . Lansoprazole Other (See Comments)    Agitation  . Nsaids Other (See Comments)    Cannot take nsaids-  etc due to heart   . Other     All betablockers make him nauseated ("vertical vomiting")  . Promethazine Other (See Comments)    Sedated  . Statins Other (See Comments)    York Spaniel it causes pain?    No prescriptions prior to admission    No results found for this or any previous visit (from the past 48 hour(s)). No results found.  Review of Systems  All other systems reviewed and are negative.   There were no vitals taken for this visit. Physical Exam   On examination patient has a palpable pulse. There is ulceration osteomyelitis left foot fifth metatarsal Assessment/Plan Assessment: Ulceration ostomy myelitis left foot fifth metatarsal.  Plan: We'll plan for fifth ray amputation left foot. Risks and benefits were discussed including risk of the wound not healing. Patient states he understands was to proceed at this time.  DUDA,MARCUS V 09/23/2014, 6:44 AM

## 2014-09-23 NOTE — Op Note (Signed)
09/23/2014  4:11 PM  PATIENT:  Alexander Pennington    PRE-OPERATIVE DIAGNOSIS:  Osteomyelitis Left Foot 5th MTH  POST-OPERATIVE DIAGNOSIS:  Same  PROCEDURE:  Foot 5th Ray Amputation  SURGEON:  Nadara MustardUDA,MARCUS V, MD  PHYSICIAN ASSISTANT:None ANESTHESIA:   General  PREOPERATIVE INDICATIONS:  Alexander Pennington is a  54 y.o. male with a diagnosis of Osteomyelitis Left Foot 5th MTH who failed conservative measures and elected for surgical management.    The risks benefits and alternatives were discussed with the patient preoperatively including but not limited to the risks of infection, bleeding, nerve injury, cardiopulmonary complications, the need for revision surgery, among others, and the patient was willing to proceed.  OPERATIVE IMPLANTS: None  OPERATIVE FINDINGS: No abscess  OPERATIVE PROCEDURE: Patient was brought to the operating room after undergoing local anesthetic. After adequate levels of anesthesia were obtained patient's left lower extremity was prepped using DuraPrep draped into a sterile field. A timeout was called. A racquet incision was made around the ulcer and the fifth ray. The fifth ray was resected near the base. This was beveled plantarly. The toe ulcer and metatarsal were resected in one block of tissue. The wound was irrigated with normal saline. Hemostasis was obtained. The incision was closed using 2-0 nylon. Sterile compressive dressing was applied. Patient was taken to the PACU in stable condition.

## 2014-09-23 NOTE — Anesthesia Preprocedure Evaluation (Signed)
Anesthesia Evaluation  Patient identified by MRN, date of birth, ID band Patient awake    Reviewed: Allergy & Precautions, NPO status , Patient's Chart, lab work & pertinent test results  Airway Mallampati: II   Neck ROM: full    Dental   Pulmonary sleep apnea , former smoker,          Cardiovascular hypertension, + CAD     Neuro/Psych    GI/Hepatic   Endo/Other  diabetes, Type obesity  Renal/GU      Musculoskeletal  (+) Arthritis -,   Abdominal   Peds  Hematology   Anesthesia Other Findings   Reproductive/Obstetrics                             Anesthesia Physical Anesthesia Plan  ASA: III  Anesthesia Plan: General   Post-op Pain Management:    Induction: Intravenous  Airway Management Planned: LMA  Additional Equipment:   Intra-op Plan:   Post-operative Plan:   Informed Consent: I have reviewed the patients History and Physical, chart, labs and discussed the procedure including the risks, benefits and alternatives for the proposed anesthesia with the patient or authorized representative who has indicated his/her understanding and acceptance.     Plan Discussed with: CRNA, Anesthesiologist and Surgeon  Anesthesia Plan Comments:         Anesthesia Quick Evaluation

## 2014-09-23 NOTE — Anesthesia Procedure Notes (Addendum)
Anesthesia Regional Block:  Popliteal block  Pre-Anesthetic Checklist: ,, timeout performed, Correct Patient, Correct Site, Correct Laterality, Correct Procedure, Correct Position, site marked, Risks and benefits discussed,  Surgical consent,  Pre-op evaluation,  At surgeon's request and post-op pain management  Laterality: Left  Prep: chloraprep       Needles:  Injection technique: Single-shot  Needle Type: Echogenic Stimulator Needle     Needle Length: 9cm 9 cm Needle Gauge: 21 and 21 G    Additional Needles:  Procedures: ultrasound guided (picture in chart) and nerve stimulator Popliteal block  Nerve Stimulator or Paresthesia:  Response: plantar flexion of foot, 0.45 mA,   Additional Responses:   Narrative:  Start time: 09/23/2014 2:55 PM End time: 09/23/2014 3:05 PM Injection made incrementally with aspirations every 5 mL.  Performed by: Personally  Anesthesiologist: HODIERNE, ADAM  Additional Notes: Functioning IV was confirmed and monitors were applied.  A 90mm 21ga Arrow echogenic stimulator needle was used. Sterile prep and drape,hand hygiene and sterile gloves were used.  Negative aspiration and negative test dose prior to incremental administration of local anesthetic. The patient tolerated the procedure well.  Ultrasound guidance: relevent anatomy identified, needle position confirmed, local anesthetic spread visualized around nerve(s), vascular puncture avoided.  Image printed for medical record.    Procedure Name: MAC Date/Time: 09/23/2014 4:00 PM Performed by: Coralee RudFLORES, Dola Lunsford Pre-anesthesia Checklist: Patient identified, Emergency Drugs available, Suction available and Patient being monitored Patient Re-evaluated:Patient Re-evaluated prior to inductionOxygen Delivery Method: Simple face mask Preoxygenation: Pre-oxygenation with 100% oxygen Intubation Type: IV induction Ventilation: Oral airway inserted - appropriate to patient size Placement Confirmation:  positive ETCO2

## 2014-09-23 NOTE — Anesthesia Postprocedure Evaluation (Signed)
  Anesthesia Post-op Note  Patient: Alexander EdwardsDavid Pennington  Procedure(s) Performed: Procedure(s): Foot 5th Ray Amputation (Left)  Patient Location: PACU  Anesthesia Type:General  Level of Consciousness: awake, alert , oriented and patient cooperative  Airway and Oxygen Therapy: Patient Spontanous Breathing  Post-op Pain: none  Post-op Assessment: Post-op Vital signs reviewed, Patient's Cardiovascular Status Stable, Respiratory Function Stable, Patent Airway, No signs of Nausea or vomiting and Pain level controlled  Post-op Vital Signs: stable  Last Vitals:  Filed Vitals:   09/23/14 1655  BP: 110/54  Pulse: 66  Temp:   Resp: 21    Complications: No apparent anesthesia complications

## 2014-09-26 ENCOUNTER — Encounter (HOSPITAL_COMMUNITY): Payer: Self-pay | Admitting: Orthopedic Surgery

## 2014-09-29 ENCOUNTER — Encounter (HOSPITAL_COMMUNITY): Payer: Self-pay | Admitting: Orthopaedic Surgery

## 2014-10-28 ENCOUNTER — Other Ambulatory Visit (HOSPITAL_COMMUNITY): Payer: Self-pay | Admitting: Orthopedic Surgery

## 2014-11-03 ENCOUNTER — Encounter (HOSPITAL_COMMUNITY): Payer: Self-pay | Admitting: *Deleted

## 2014-11-03 MED ORDER — DEXTROSE 5 % IV SOLN
3.0000 g | INTRAVENOUS | Status: AC
  Administered 2014-11-04: 3 g via INTRAVENOUS
  Filled 2014-11-03: qty 3000

## 2014-11-04 ENCOUNTER — Encounter (HOSPITAL_COMMUNITY)
Admission: RE | Disposition: A | Discharge status: Home / Self Care | Payer: Self-pay | Source: Ambulatory Visit | Attending: Orthopedic Surgery

## 2014-11-04 ENCOUNTER — Ambulatory Visit (HOSPITAL_COMMUNITY): Payer: BLUE CROSS/BLUE SHIELD | Admitting: Certified Registered Nurse Anesthetist

## 2014-11-04 ENCOUNTER — Encounter (HOSPITAL_COMMUNITY): Payer: Self-pay

## 2014-11-04 ENCOUNTER — Ambulatory Visit (HOSPITAL_COMMUNITY)
Admission: RE | Admit: 2014-11-04 | Discharge: 2014-11-04 | Disposition: A | Discharge status: Home / Self Care | Payer: BLUE CROSS/BLUE SHIELD | Source: Ambulatory Visit | Attending: Orthopedic Surgery | Admitting: Orthopedic Surgery

## 2014-11-04 DIAGNOSIS — G4730 Sleep apnea, unspecified: Secondary | ICD-10-CM | POA: Insufficient documentation

## 2014-11-04 DIAGNOSIS — Z9861 Coronary angioplasty status: Secondary | ICD-10-CM | POA: Insufficient documentation

## 2014-11-04 DIAGNOSIS — Z887 Allergy status to serum and vaccine status: Secondary | ICD-10-CM | POA: Diagnosis not present

## 2014-11-04 DIAGNOSIS — Z87442 Personal history of urinary calculi: Secondary | ICD-10-CM | POA: Insufficient documentation

## 2014-11-04 DIAGNOSIS — Z87891 Personal history of nicotine dependence: Secondary | ICD-10-CM | POA: Insufficient documentation

## 2014-11-04 DIAGNOSIS — E11610 Type 2 diabetes mellitus with diabetic neuropathic arthropathy: Secondary | ICD-10-CM | POA: Diagnosis not present

## 2014-11-04 DIAGNOSIS — Z881 Allergy status to other antibiotic agents status: Secondary | ICD-10-CM | POA: Diagnosis not present

## 2014-11-04 DIAGNOSIS — Z888 Allergy status to other drugs, medicaments and biological substances status: Secondary | ICD-10-CM | POA: Diagnosis not present

## 2014-11-04 DIAGNOSIS — L97419 Non-pressure chronic ulcer of right heel and midfoot with unspecified severity: Secondary | ICD-10-CM | POA: Insufficient documentation

## 2014-11-04 DIAGNOSIS — Z91012 Allergy to eggs: Secondary | ICD-10-CM | POA: Insufficient documentation

## 2014-11-04 DIAGNOSIS — Z89432 Acquired absence of left foot: Secondary | ICD-10-CM | POA: Diagnosis not present

## 2014-11-04 DIAGNOSIS — Z9852 Vasectomy status: Secondary | ICD-10-CM | POA: Insufficient documentation

## 2014-11-04 DIAGNOSIS — E785 Hyperlipidemia, unspecified: Secondary | ICD-10-CM | POA: Diagnosis not present

## 2014-11-04 DIAGNOSIS — Z6837 Body mass index (BMI) 37.0-37.9, adult: Secondary | ICD-10-CM | POA: Diagnosis not present

## 2014-11-04 DIAGNOSIS — L97509 Non-pressure chronic ulcer of other part of unspecified foot with unspecified severity: Secondary | ICD-10-CM

## 2014-11-04 DIAGNOSIS — I2510 Atherosclerotic heart disease of native coronary artery without angina pectoris: Secondary | ICD-10-CM | POA: Diagnosis not present

## 2014-11-04 DIAGNOSIS — I10 Essential (primary) hypertension: Secondary | ICD-10-CM | POA: Diagnosis not present

## 2014-11-04 DIAGNOSIS — Z8673 Personal history of transient ischemic attack (TIA), and cerebral infarction without residual deficits: Secondary | ICD-10-CM | POA: Diagnosis not present

## 2014-11-04 DIAGNOSIS — E11621 Type 2 diabetes mellitus with foot ulcer: Secondary | ICD-10-CM | POA: Insufficient documentation

## 2014-11-04 DIAGNOSIS — E669 Obesity, unspecified: Secondary | ICD-10-CM | POA: Insufficient documentation

## 2014-11-04 HISTORY — PX: I & D EXTREMITY: SHX5045

## 2014-11-04 HISTORY — DX: Type 2 diabetes mellitus with diabetic neuropathic arthropathy: E11.610

## 2014-11-04 LAB — COMPREHENSIVE METABOLIC PANEL
ALK PHOS: 65 U/L (ref 39–117)
ALT: 18 U/L (ref 0–53)
AST: 22 U/L (ref 0–37)
Albumin: 4.2 g/dL (ref 3.5–5.2)
Anion gap: 10 (ref 5–15)
BILIRUBIN TOTAL: 0.8 mg/dL (ref 0.3–1.2)
BUN: 17 mg/dL (ref 6–23)
CHLORIDE: 105 mmol/L (ref 96–112)
CO2: 23 mmol/L (ref 19–32)
Calcium: 9.4 mg/dL (ref 8.4–10.5)
Creatinine, Ser: 0.6 mg/dL (ref 0.50–1.35)
GFR calc Af Amer: >90 mL/min (ref >90)
GFR calc non Af Amer: >90 mL/min (ref >90)
Glucose, Bld: 160 mg/dL — ABNORMAL HIGH (ref 70–99)
POTASSIUM: 4.4 mmol/L (ref 3.5–5.1)
Sodium: 138 mmol/L (ref 135–145)
Total Protein: 7.3 g/dL (ref 6.0–8.3)

## 2014-11-04 LAB — CBC
HEMATOCRIT: 44.4 % (ref 39.0–52.0)
HEMOGLOBIN: 15.5 g/dL (ref 13.0–17.0)
MCH: 29.1 pg (ref 26.0–34.0)
MCHC: 34.9 g/dL (ref 30.0–36.0)
MCV: 83.3 fL (ref 78.0–100.0)
Platelets: 168 10*3/uL (ref 150–400)
RBC: 5.33 MIL/uL (ref 4.22–5.81)
RDW: 14 % (ref 11.5–15.5)
WBC: 6.9 10*3/uL (ref 4.0–10.5)

## 2014-11-04 LAB — PROTIME-INR
INR: 1.01 (ref 0.00–1.49)
PROTHROMBIN TIME: 13.4 s (ref 11.6–15.2)

## 2014-11-04 LAB — GLUCOSE, CAPILLARY
GLUCOSE-CAPILLARY: 159 mg/dL — AB (ref 70–99)
GLUCOSE-CAPILLARY: 159 mg/dL — AB (ref 70–99)

## 2014-11-04 LAB — APTT: aPTT: 32 seconds (ref 24–37)

## 2014-11-04 SURGERY — IRRIGATION AND DEBRIDEMENT EXTREMITY
Anesthesia: Monitor Anesthesia Care | Laterality: Right

## 2014-11-04 MED ORDER — MIDAZOLAM HCL 5 MG/5ML IJ SOLN
INTRAMUSCULAR | Status: DC | PRN
  Administered 2014-11-04: 2 mg via INTRAVENOUS

## 2014-11-04 MED ORDER — 0.9 % SODIUM CHLORIDE (POUR BTL) OPTIME
TOPICAL | Status: DC | PRN
  Administered 2014-11-04: 1000 mL

## 2014-11-04 MED ORDER — MIDAZOLAM HCL 2 MG/2ML IJ SOLN
INTRAMUSCULAR | Status: AC
  Filled 2014-11-04: qty 2

## 2014-11-04 MED ORDER — ONDANSETRON HCL 4 MG/2ML IJ SOLN: 4.0000 mg | Freq: Once | INTRAMUSCULAR | Status: DC | PRN

## 2014-11-04 MED ORDER — FENTANYL CITRATE 0.05 MG/ML IJ SOLN
INTRAMUSCULAR | Status: AC
  Filled 2014-11-04: qty 2

## 2014-11-04 MED ORDER — PROPOFOL 10 MG/ML IV BOLUS
INTRAVENOUS | Status: DC | PRN
  Administered 2014-11-04: 40 mg via INTRAVENOUS
  Administered 2014-11-04: 20 mg via INTRAVENOUS

## 2014-11-04 MED ORDER — HYDROMORPHONE HCL 1 MG/ML IJ SOLN: 0.2500 mg | INTRAMUSCULAR | Status: DC | PRN

## 2014-11-04 MED ORDER — LIDOCAINE-EPINEPHRINE (PF) 1.5 %-1:200000 IJ SOLN
INTRAMUSCULAR | Status: DC | PRN
  Administered 2014-11-04: 30 mL via PERINEURAL

## 2014-11-04 MED ORDER — FENTANYL CITRATE 0.05 MG/ML IJ SOLN
INTRAMUSCULAR | Status: AC
  Filled 2014-11-04: qty 5

## 2014-11-04 MED ORDER — PROPOFOL INFUSION 10 MG/ML OPTIME
INTRAVENOUS | Status: DC | PRN
  Administered 2014-11-04: 50 ug/kg/min via INTRAVENOUS

## 2014-11-04 MED ORDER — LACTATED RINGERS IV SOLN
INTRAVENOUS | Status: DC
  Administered 2014-11-04: 11:00:00 via INTRAVENOUS

## 2014-11-04 MED ORDER — OXYCODONE-ACETAMINOPHEN 5-325 MG PO TABS: 1.0000 | ORAL_TABLET | ORAL | Status: DC | PRN

## 2014-11-04 MED ORDER — MEPERIDINE HCL 25 MG/ML IJ SOLN: 6.2500 mg | INTRAMUSCULAR | Status: DC | PRN

## 2014-11-04 MED ORDER — PROPOFOL 10 MG/ML IV BOLUS
INTRAVENOUS | Status: AC
  Filled 2014-11-04: qty 20

## 2014-11-04 MED ORDER — FENTANYL CITRATE 0.05 MG/ML IJ SOLN
INTRAMUSCULAR | Status: DC | PRN
  Administered 2014-11-04: 100 ug via INTRAVENOUS

## 2014-11-04 MED ORDER — BUPIVACAINE-EPINEPHRINE (PF) 0.5% -1:200000 IJ SOLN
INTRAMUSCULAR | Status: DC | PRN
  Administered 2014-11-04: 30 mL via PERINEURAL

## 2014-11-04 SURGICAL SUPPLY — 41 items
BLADE SURG 10 STRL SS (BLADE) IMPLANT
BNDG COHESIVE 4X5 TAN STRL (GAUZE/BANDAGES/DRESSINGS) ×2 IMPLANT
BNDG COHESIVE 6X5 TAN STRL LF (GAUZE/BANDAGES/DRESSINGS) IMPLANT
BNDG GAUZE ELAST 4 BULKY (GAUZE/BANDAGES/DRESSINGS) ×2 IMPLANT
COVER SURGICAL LIGHT HANDLE (MISCELLANEOUS) ×2 IMPLANT
CUFF TOURNIQUET SINGLE 18IN (TOURNIQUET CUFF) IMPLANT
CUFF TOURNIQUET SINGLE 24IN (TOURNIQUET CUFF) IMPLANT
CUFF TOURNIQUET SINGLE 34IN LL (TOURNIQUET CUFF) IMPLANT
CUFF TOURNIQUET SINGLE 44IN (TOURNIQUET CUFF) IMPLANT
DRAPE U-SHAPE 47X51 STRL (DRAPES) ×2 IMPLANT
DRSG ADAPTIC 3X8 NADH LF (GAUZE/BANDAGES/DRESSINGS) ×2 IMPLANT
DRSG PAD ABDOMINAL 8X10 ST (GAUZE/BANDAGES/DRESSINGS) ×2 IMPLANT
DURAPREP 26ML APPLICATOR (WOUND CARE) ×2 IMPLANT
ELECT CAUTERY BLADE 6.4 (BLADE) ×2 IMPLANT
ELECT REM PT RETURN 9FT ADLT (ELECTROSURGICAL) ×2
ELECTRODE REM PT RTRN 9FT ADLT (ELECTROSURGICAL) ×1 IMPLANT
GAUZE SPONGE 4X4 12PLY STRL (GAUZE/BANDAGES/DRESSINGS) ×2 IMPLANT
GLOVE BIOGEL PI IND STRL 9 (GLOVE) ×1 IMPLANT
GLOVE BIOGEL PI INDICATOR 9 (GLOVE) ×1
GLOVE SURG ORTHO 9.0 STRL STRW (GLOVE) ×2 IMPLANT
GOWN STRL REUS W/ TWL XL LVL3 (GOWN DISPOSABLE) ×2 IMPLANT
GOWN STRL REUS W/TWL XL LVL3 (GOWN DISPOSABLE) ×2
HANDPIECE INTERPULSE COAX TIP (DISPOSABLE)
KIT BASIN OR (CUSTOM PROCEDURE TRAY) ×6 IMPLANT
KIT ROOM TURNOVER OR (KITS) ×2 IMPLANT
MANIFOLD NEPTUNE II (INSTRUMENTS) ×2 IMPLANT
NS IRRIG 1000ML POUR BTL (IV SOLUTION) ×2 IMPLANT
PACK ORTHO EXTREMITY (CUSTOM PROCEDURE TRAY) ×2 IMPLANT
PAD ARMBOARD 7.5X6 YLW CONV (MISCELLANEOUS) ×4 IMPLANT
PADDING CAST COTTON 6X4 STRL (CAST SUPPLIES) ×2 IMPLANT
SET HNDPC FAN SPRY TIP SCT (DISPOSABLE) IMPLANT
SPONGE LAP 18X18 X RAY DECT (DISPOSABLE) ×2 IMPLANT
STOCKINETTE IMPERVIOUS 9X36 MD (GAUZE/BANDAGES/DRESSINGS) IMPLANT
SUT ETHILON 2 0 PSLX (SUTURE) ×2 IMPLANT
TOWEL OR 17X24 6PK STRL BLUE (TOWEL DISPOSABLE) ×2 IMPLANT
TOWEL OR 17X26 10 PK STRL BLUE (TOWEL DISPOSABLE) ×2 IMPLANT
TUBE ANAEROBIC SPECIMEN COL (MISCELLANEOUS) IMPLANT
TUBE CONNECTING 12X1/4 (SUCTIONS) ×2 IMPLANT
UNDERPAD 30X30 INCONTINENT (UNDERPADS AND DIAPERS) ×2 IMPLANT
WATER STERILE IRR 1000ML POUR (IV SOLUTION) IMPLANT
YANKAUER SUCT BULB TIP NO VENT (SUCTIONS) ×2 IMPLANT

## 2014-11-04 NOTE — Discharge Summary (Signed)
Discharge to home in stable condition. 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 Surgical procedure excision Charcot collapse with partial excision of the cuboid and lateral cuneiform, and local tissue rearrangement for wound closure. Plan to follow-up in the office in 2 weeks. Touchdown weightbearing no change of dressing. Patient discharged from the recovery room.

## 2014-11-04 NOTE — Transfer of Care (Signed)
Immediate Anesthesia Transfer of Care Note  Patient: Alexander Pennington  Procedure(s) Performed: Procedure(s): Excision Charcot Collapse Right Foot (Right)  Patient Location: PACU  Anesthesia Type:MAC and Regional  Level of Consciousness: awake alert and responsive   Airway & Oxygen Therapy: Patient Spontanous Breathing and Patient connected to nasal cannula oxygen  Post-op Assessment: Report given to RN, Post -op Vital signs reviewed and stable, Patient moving all extremities and Patient moving all extremities X 4  Post vital signs: Reviewed and stable  Last Vitals:  Filed Vitals:   11/04/14 1307  BP:   Pulse:   Temp: 36.7 C  Resp:     Complications: No apparent anesthesia complications

## 2014-11-04 NOTE — Op Note (Signed)
11/04/2014  5:33 PM  PATIENT:  Alexander Pennington    PRE-OPERATIVE DIAGNOSIS:  Charcot Collapse Right Foot with Ulcer  POST-OPERATIVE DIAGNOSIS:  Same  PROCEDURE:  Excision Charcot Collapse Right Foot Excision of cuboid and cuneiform partial excision Local tissue rearrangement 6 x 3 cm  SURGEON:  Nadara MustardUDA,MARCUS V, MD  PHYSICIAN ASSISTANT:None ANESTHESIA:   General  PREOPERATIVE INDICATIONS:  Alexander NurseDavid O Deschamps is a  54 y.o. male with a diagnosis of Charcot Collapse Right Foot with Ulcer who failed conservative measures and elected for surgical management.    The risks benefits and alternatives were discussed with the patient preoperatively including but not limited to the risks of infection, bleeding, nerve injury, cardiopulmonary complications, the need for revision surgery, among others, and the patient was willing to proceed.  OPERATIVE IMPLANTS: None  OPERATIVE FINDINGS: No deep abscess or infection  OPERATIVE PROCEDURE: Patient was brought to the operating room after a regional block. After adequate levels of anesthesia were obtained patient's right lower extremity was prepped using DuraPrep draped into a sterile field. A timeout was called. A longitudinal incision was made in the ulcerative area was ellipsed out in 1 block of tissue. This was carried down to the cuboid and lateral cuneiform. Using osteotome partial excision of the cuboid and lateral cuneiform was obtained. Electrocautery was used for hemostasis. Local tissue rearrangement was performed with rearrangement of the wound 6 x 3 cm. The wound was closed using 2-0 nylon. A sterile compressive dressing was applied. Patient was taken to the PACU in stable condition.

## 2014-11-04 NOTE — Anesthesia Postprocedure Evaluation (Signed)
Anesthesia Post Note  Patient: Glade NurseDavid O Meggett  Procedure(s) Performed: Procedure(s) (LRB): Excision Charcot Collapse Right Foot (Right)  Anesthesia type: general  Patient location: PACU  Post pain: Pain level controlled  Post assessment: Patient's Cardiovascular Status Stable  Last Vitals:  Filed Vitals:   11/04/14 1445  BP:   Pulse: 75  Temp:   Resp: 13    Post vital signs: Reviewed and stable  Level of consciousness: sedated  Complications: No apparent anesthesia complications

## 2014-11-04 NOTE — Anesthesia Preprocedure Evaluation (Signed)
Anesthesia Evaluation  Patient identified by MRN, date of birth, ID band Patient awake    Reviewed: Allergy & Precautions, NPO status , Patient's Chart, lab work & pertinent test results  Airway Mallampati: II  TM Distance: >3 FB Neck ROM: Full    Dental   Pulmonary sleep apnea , former smoker,          Cardiovascular hypertension, Pt. on medications     Neuro/Psych    GI/Hepatic   Endo/Other  diabetes, Type 2, Oral Hypoglycemic Agents  Renal/GU      Musculoskeletal   Abdominal   Peds  Hematology   Anesthesia Other Findings   Reproductive/Obstetrics                             Anesthesia Physical Anesthesia Plan  ASA: III  Anesthesia Plan: General   Post-op Pain Management:    Induction: Intravenous  Airway Management Planned: LMA  Additional Equipment:   Intra-op Plan:   Post-operative Plan: Extubation in OR  Informed Consent: I have reviewed the patients History and Physical, chart, labs and discussed the procedure including the risks, benefits and alternatives for the proposed anesthesia with the patient or authorized representative who has indicated his/her understanding and acceptance.     Plan Discussed with: CRNA and Surgeon  Anesthesia Plan Comments:         Anesthesia Quick Evaluation

## 2014-11-04 NOTE — Progress Notes (Signed)
Orthopedic Tech Progress Note Patient Details:  Alexander NurseDavid O Pennington 01/02/1961 [id number]  Ortho Devices Type of Ortho Device: Postop shoe/boot Ortho Device/Splint Location: RLE Ortho Device/Splint Interventions: Application   [name] 11/04/2014, 1:50 PM

## 2014-11-04 NOTE — H&P (Signed)
NATE COMMON is an 54 y.o. male.   Chief Complaint: Diabetic insensate neuropathy with Charcot collapse right foot HPI: Patient is a 54 year old gentleman with diabetic insensate neuropathy who has Charcot collapse ulceration of the right foot.  Past Medical History  Diagnosis Date  . Hemorrhagic stroke     a. Dx 2010. 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 Unclear etiology. right eye damaged - has no central vision, has a little problem with balance  . Hypertension   . Neuropathy   . Murmur, heart Fall 2014 - diagnosed  . Complication of anesthesia     hx deviated septum, sleep apnea- has had problems  . Obesity     a. hx of lap band surgery.  . Sleep apnea     uses c-pap  . Diabetes mellitus     type 2  . Diabetic Charcot foot   . Kidney stones   . Family history of anesthesia complication     mom with n/v  . CAD (coronary artery disease)   . Hyperlipidemia   . Charcot foot due to diabetes mellitus     Past Surgical History  Procedure Laterality Date  . Lap band surgery      2009  . Foot surgery      foot surgery x 2  . Carpal tunnel release Left     bone removed also, nerve also cut  . I&d extremity Bilateral 06/17/2014    Procedure: IRRIGATION AND DEBRIDEMENT BILATERAL FOOT WOUNDS;  Surgeon: Kathryne Hitch, MD;  Location: [facility] ORS;  Service: Orthopedics;  Laterality: Bilateral;  . Vasectomy    . Colonoscopy    . Amputation Left 07/06/2014    Procedure: Left 2nd and Possible 3rd Ray Amputation;  Surgeon: Nadara Mustard, MD;  Location: [facility] OR;  Service: Orthopedics;  Laterality: Left;  . Hardware removal Left 07/06/2014    Procedure: Removal Deep Hardware Left Foot;  Surgeon: Nadara Mustard, MD;  Location: [facility] OR;  Service: Orthopedics;  Laterality: Left;  . Left heart catheterization with coronary angiogram N/A 06/16/2014    Procedure: LEFT HEART CATHETERIZATION WITH CORONARY ANGIOGRAM;  Surgeon: Micheline Chapman, MD;  Location: [facility] CATH LAB;  Service: Cardiovascular;   Laterality: N/A;  . Amputation Left 09/23/2014    Procedure: Foot 5th Ray Amputation;  Surgeon: Nadara Mustard, MD;  Location: [facility] OR;  Service: Orthopedics;  Laterality: Left;    Family History  Problem Relation Age of Onset  . Heart attack Father    Social History:  reports that he quit smoking about 26 years ago. His smoking use included Cigarettes. He has never used smokeless tobacco. He reports that he drinks alcohol. He reports that he does not use illicit drugs.  Allergies:  Allergies  Allergen Reactions  . Beta Adrenergic Blockers Nausea And Vomiting  . Tetanus Toxoids     "Sore arm all the way down, Painful, red"  . Ciprofloxacin Other (See Comments)    Altered mental status  . Eggs Or Egg-Derived Products Diarrhea    Constant flu after flu shot.  . Labetalol Hcl Nausea Only  . Lansoprazole Other (See Comments)    Agitation  . Nsaids Other (See Comments)    Cannot take nsaids-  etc due to heart   . Other     All betablockers make him nauseated ("vertical vomiting")  . Promethazine Other (See Comments)    Sedated  . Statins Other (See Comments)    York Spaniel it causes pain?  No prescriptions prior to admission    No results found for this or any previous visit (from the past 48 hour(s)). No results found.  Review of Systems  All other systems reviewed and are negative.   There were no vitals taken for this visit. Physical Exam  On examination patient has a Charcot rocker-bottom deformity to the right foot with ulceration. Assessment/Plan Assessment: Diabetic insensate neuropathy with Charcot collapse rocker-bottom deformity right foot with ulceration.  Plan: We will plan for excision of the Charcot rocker-bottom deformity. Patient may require internal fixation. Risks and benefits were discussed including recurrent deformity to the foot. Patient states he understands and wishes to proceed at this time.  Mary-Anne Polizzi V 11/04/2014, 6:10 AM

## 2014-11-04 NOTE — Anesthesia Procedure Notes (Addendum)
Anesthesia Regional Block:  Popliteal block  Pre-Anesthetic Checklist: ,, timeout performed, Correct Patient, Correct Site, Correct Laterality, Correct Procedure, Correct Position, site marked, Risks and benefits discussed,  Surgical consent,  Pre-op evaluation,  At surgeon's request and post-op pain management  Laterality: Right  Prep: chloraprep       Needles:  Injection technique: Single-shot  Needle Type: Echogenic Stimulator Needle     Needle Length: 9cm 9 cm Needle Gauge: 21 and 21 G    Additional Needles:  Procedures: ultrasound guided (picture in chart) and nerve stimulator Popliteal block  Nerve Stimulator or Paresthesia:  Response: 0.4 mA,   Additional Responses:   Narrative:  Start time: 11/04/2014 12:05 PM End time: 11/04/2014 12:20 PM Injection made incrementally with aspirations every 5 mL.  Performed by: Personally  Anesthesiologist: Arta BruceSSEY, Lizza Huffaker  Additional Notes: Monitors applied. Patient sedated. Sterile prep and drape,hand hygiene and sterile gloves were used. Relevant anatomy identified.Needle position confirmed.Local anesthetic injected incrementally after negative aspiration. Local anesthetic spread visualized around nerve(s). Vascular puncture avoided. No complications. Image printed for medical record.The patient tolerated the procedure well.  Additional Saphenous nerve block performed. 15cc Local Anesthetic mixture placed under ultrasonic guidance along the medio-inferior border of the Sartorious muscle 6 inches above the knee.  No Problems encountered.  Arta BruceKevin Jonuel Butterfield MD

## 2014-11-05 ENCOUNTER — Encounter (HOSPITAL_COMMUNITY): Payer: Self-pay | Admitting: Orthopedic Surgery

## 2014-12-05 ENCOUNTER — Other Ambulatory Visit: Payer: Self-pay | Admitting: Family Medicine

## 2014-12-15 ENCOUNTER — Other Ambulatory Visit: Payer: Self-pay | Admitting: Family Medicine

## 2014-12-15 ENCOUNTER — Other Ambulatory Visit: Payer: Self-pay | Admitting: *Deleted

## 2014-12-15 MED ORDER — TESTOSTERONE 20.25 MG/1.25GM (1.62%) TD GEL: 2.0000 | Freq: Every day | TRANSDERMAL | Status: DC

## 2014-12-15 MED ORDER — EZETIMIBE 10 MG PO TABS: 10.0000 mg | ORAL_TABLET | Freq: Every day | ORAL | Status: DC

## 2014-12-15 NOTE — Progress Notes (Signed)
Pt needed refills NTBS per Dr Darlyn ReadStacks appt scheduled Refill given for 1 mth per Dr Darlyn ReadStacks

## 2014-12-15 NOTE — Telephone Encounter (Signed)
Tell him I said I would be happy to refill his meds at his next office visit, but I can not before then.

## 2014-12-15 NOTE — Telephone Encounter (Signed)
Patient called requesting hydrocodone refill and tramadol refill. He has several meds on med list. Please review and if approved please fill appropriate med

## 2014-12-22 NOTE — Telephone Encounter (Signed)
Patient has a appointment with stacks on 4/13

## 2014-12-28 ENCOUNTER — Ambulatory Visit (INDEPENDENT_AMBULATORY_CARE_PROVIDER_SITE_OTHER): Payer: BLUE CROSS/BLUE SHIELD | Admitting: Family Medicine

## 2014-12-28 ENCOUNTER — Encounter: Payer: Self-pay | Admitting: Family Medicine

## 2014-12-28 VITALS — BP 142/81 | HR 84 | Temp 97.9°F | Ht 69.0 in | Wt 255.0 lb

## 2014-12-28 DIAGNOSIS — E291 Testicular hypofunction: Secondary | ICD-10-CM

## 2014-12-28 DIAGNOSIS — E559 Vitamin D deficiency, unspecified: Secondary | ICD-10-CM

## 2014-12-28 DIAGNOSIS — L97509 Non-pressure chronic ulcer of other part of unspecified foot with unspecified severity: Secondary | ICD-10-CM | POA: Diagnosis not present

## 2014-12-28 DIAGNOSIS — E785 Hyperlipidemia, unspecified: Secondary | ICD-10-CM | POA: Diagnosis not present

## 2014-12-28 DIAGNOSIS — Z202 Contact with and (suspected) exposure to infections with a predominantly sexual mode of transmission: Secondary | ICD-10-CM | POA: Diagnosis not present

## 2014-12-28 DIAGNOSIS — E349 Endocrine disorder, unspecified: Secondary | ICD-10-CM

## 2014-12-28 DIAGNOSIS — I10 Essential (primary) hypertension: Secondary | ICD-10-CM

## 2014-12-28 DIAGNOSIS — E11621 Type 2 diabetes mellitus with foot ulcer: Secondary | ICD-10-CM

## 2014-12-28 LAB — POCT CBC
Granulocyte percent: 76.4 %G (ref 37–80)
HCT, POC: 48.6 % (ref 43.5–53.7)
HEMOGLOBIN: 15.4 g/dL (ref 14.1–18.1)
LYMPH, POC: 1.6 (ref 0.6–3.4)
MCH, POC: 27.2 pg (ref 27–31.2)
MCHC: 31.6 g/dL — AB (ref 31.8–35.4)
MCV: 85.9 fL (ref 80–97)
MPV: 8.7 fL (ref 0–99.8)
POC GRANULOCYTE: 6.6 (ref 2–6.9)
POC LYMPH %: 18.4 % (ref 10–50)
Platelet Count, POC: 195 10*3/uL (ref 142–424)
RBC: 5.66 M/uL (ref 4.69–6.13)
RDW, POC: 13.1 %
WBC: 8.7 10*3/uL (ref 4.6–10.2)

## 2014-12-28 LAB — POCT UA - MICROALBUMIN: Microalbumin Ur, POC: NEGATIVE mg/L

## 2014-12-28 LAB — POCT GLYCOSYLATED HEMOGLOBIN (HGB A1C): Hemoglobin A1C: 6.8

## 2014-12-28 MED ORDER — TESTOSTERONE 20.25 MG/ACT (1.62%) TD GEL: 2.0000 | Freq: Every day | TRANSDERMAL | Status: DC

## 2014-12-28 MED ORDER — HYDROCODONE-ACETAMINOPHEN 5-325 MG PO TABS: 1.0000 | ORAL_TABLET | Freq: Four times a day (QID) | ORAL | Status: DC | PRN

## 2014-12-28 MED ORDER — VITAMIN D (ERGOCALCIFEROL) 1.25 MG (50000 UNIT) PO CAPS: 50000.0000 [IU] | ORAL_CAPSULE | ORAL | Status: DC

## 2014-12-28 NOTE — Progress Notes (Signed)
Subjective:  Patient ID: Alexander Pennington, male    DOB: 1961-08-31  Age: 54 y.o. MRN: [id number]  CC: Hypertension; Diabetes; and Hyperlipidemia   HPI Alexander Pennington presents for  follow-up of hypertension. Patient has no history of headache chest pain or shortness of breath or recent cough. Patient also denies symptoms of TIA such as numbness weakness lateralizing. Patient checks  blood pressure at home and has not had any elevated readings recently. Patient denies side effects from his medication. States taking it regularly.  Patient also  in for follow-up of elevated cholesterol. Doing well without complaints on current medication. Denies side effects of statin including myalgia and arthralgia and nausea. Also in today for liver function testing. Currently no chest pain, shortness of breath or other cardiovascular related symptoms noted.  Follow-up of diabetes. Patient does check blood sugar at home. Readings run between 130 and 140 Patient denies symptoms such as polyuria, polydipsia, excessive hunger, nausea No significant hypoglycemic spells noted. Medications as noted below. Taking them regularly without complication/adverse reaction being reported today.   Patient has been taking testosterone supplements for many years as well. Doing well with that without complaints. Due today for prescription refill and recheck of level. No signs of osteoporosis regarding fractures or loss of muscle tone etc. have been noted   History Alexander Pennington has a past medical history of Hemorrhagic stroke; Hypertension; Neuropathy; Murmur, heart (Fall 2014 - diagnosed); Obesity; Sleep apnea; Diabetes mellitus; Diabetic Charcot foot; Kidney stones; Family history of anesthesia complication; CAD (coronary artery disease); Hyperlipidemia; Charcot foot due to diabetes mellitus; and Complication of anesthesia.   He has past surgical history that includes lap band surgery; Foot surgery; Carpal tunnel release (Left); I&D  extremity (Bilateral, 06/17/2014); Vasectomy; Colonoscopy; Amputation (Left, 07/06/2014); Hardware Removal (Left, 07/06/2014); left heart catheterization with coronary angiogram (N/A, 06/16/2014); Amputation (Left, 09/23/2014); and I&D extremity (Right, 11/04/2014).   His family history includes Heart attack in his father.He reports that he quit smoking about 26 years ago. His smoking use included Cigarettes. He has never used smokeless tobacco. He reports that he drinks alcohol. He reports that he does not use illicit drugs.  Current Outpatient Prescriptions on File Prior to Visit  Medication Sig Dispense Refill  . canagliflozin (INVOKANA) 300 MG TABS tablet Take 300 mg by mouth daily before breakfast.    . celecoxib (CELEBREX) 200 MG capsule Take 200 mg by mouth 2 (two) times daily.    . Coenzyme Q10 200 MG capsule Take 1 capsule (200 mg total) by mouth daily.    [name] ezetimibe (ZETIA) 10 MG tablet Take 1 tablet (10 mg total) by mouth daily. 30 tablet 0  . Folic Acid-Vit X3-KGM W10 ([name]) 2.5-25-1 MG TABS tablet Take 1 tablet by mouth daily.    [name] gabapentin (NEURONTIN) 300 MG capsule Take 600 mg by mouth 3 (three) times daily.    [name] HYDROcodone-acetaminophen (NORCO) 5-325 MG per tablet Take 1 tablet by mouth every 6 (six) hours as needed. 60 tablet 0  . HYDROcodone-acetaminophen (NORCO/VICODIN) 5-325 MG per tablet Take 1 tablet by mouth every 6 (six) hours as needed for moderate pain. 30 tablet 0  . metformin ([name]) 1000 MG (OSM) 24 hr tablet Take 2,000 mg by mouth daily with breakfast.     . Multiple Vitamins tablet Take 1 tablet by mouth daily.    [name] oxyCODONE-acetaminophen (ROXICET) 5-325 MG per tablet Take 1 tablet by mouth every 4 (four) hours as needed for severe pain. 60 tablet 0  .  Testosterone (ANDROGEL) 20.25 MG/1.25GM (1.62%) GEL Place 2 Squirts onto the skin daily. 1.25 g 0  . traMADol (ULTRAM) 50 MG tablet Take 100 mg by mouth 3 (three) times daily.    . valsartan (DIOVAN) 320 MG tablet  Take 320 mg by mouth daily.     No current facility-administered medications on file prior to visit.    ROS Review of Systems  Constitutional: Negative for fever, chills, diaphoresis and unexpected weight change.  HENT: Negative for congestion, hearing loss, rhinorrhea, sore throat and trouble swallowing.   Respiratory: Negative for cough, chest tightness, shortness of breath and wheezing.   Gastrointestinal: Negative for nausea, vomiting, abdominal pain, diarrhea, constipation and abdominal distention.  Endocrine: Negative for cold intolerance and heat intolerance.  Genitourinary: Negative for dysuria, hematuria and flank pain.  Musculoskeletal: Positive for myalgias, joint swelling and arthralgias.  Skin: Negative for rash.  Neurological: Negative for dizziness and headaches.  Psychiatric/Behavioral: Negative for dysphoric mood, decreased concentration and agitation. The patient is not nervous/anxious.     Objective:  BP 142/81 mmHg  Pulse 84  Temp(Src) 97.9 F (36.6 C) (Oral)  Ht '5\' 9"'  (1.753 m)  Wt 255 lb (115.667 kg)  BMI 37.64 kg/m2  BP Readings from Last 3 Encounters:  12/28/14 142/81  11/04/14 125/66  09/23/14 114/71    Wt Readings from Last 3 Encounters:  12/28/14 255 lb (115.667 kg)  11/04/14 255 lb (115.667 kg)  09/23/14 265 lb (120.203 kg)     Physical Exam  Constitutional: He is oriented to person, place, and time. He appears well-developed and well-nourished. No distress.  HENT:  Head: Normocephalic and atraumatic.  Right Ear: External ear normal.  Left Ear: External ear normal.  Nose: Nose normal.  Mouth/Throat: Oropharynx is clear and moist.  Eyes: Conjunctivae and EOM are normal. Pupils are equal, round, and reactive to light.  Neck: Normal range of motion. Neck supple. No thyromegaly present.  Cardiovascular: Normal rate, regular rhythm and normal heart sounds.   No murmur heard. Pulmonary/Chest: Effort normal and breath sounds normal. No  respiratory distress. He has no wheezes. He has no rales.  Abdominal: Soft. Bowel sounds are normal. He exhibits no distension. There is no tenderness.  Lymphadenopathy:    He has no cervical adenopathy.  Neurological: He is alert and oriented to person, place, and time. He has normal reflexes.  Skin: Skin is warm and dry.  Psychiatric: He has a normal mood and affect. His behavior is normal. Judgment and thought content normal.    Lab Results  Component Value Date   HGBA1C 8.2* 06/13/2014    Lab Results  Component Value Date   WBC 6.9 11/04/2014   HGB 15.5 11/04/2014   HCT 44.4 11/04/2014   PLT 168 11/04/2014   GLUCOSE 160* 11/04/2014   ALT 18 11/04/2014   AST 22 11/04/2014   NA 138 11/04/2014   K 4.4 11/04/2014   CL 105 11/04/2014   CREATININE 0.60 11/04/2014   BUN 17 11/04/2014   CO2 23 11/04/2014   INR 1.01 11/04/2014   HGBA1C 8.2* 06/13/2014    No results found.  Assessment & Plan:   Young was seen today for hypertension, diabetes and hyperlipidemia.  Diagnoses and all orders for this visit:  Essential hypertension Orders: -     POCT CBC; Standing -     CMP14+EGFR; Standing -     CMP14+EGFR  Type 2 diabetes mellitus with foot ulcer Orders: -     POCT glycosylated hemoglobin (Hb  A1C); Standing -     POCT UA - Microalbumin -     POCT glycosylated hemoglobin (Hb A1C)  Dyslipidemia Orders: -     NMR, lipoprofile -     Lipid panel; Standing  Testosterone deficiency Orders: -     Testosterone,Free and Total  Vitamin D deficiency Orders: -     Vit D  25 hydroxy (rtn osteoporosis monitoring)   I have discontinued Mr. Douthit pravastatin, tetracycline, and ANDROGEL PUMP. I am also having him maintain his valsartan, traMADol, HYDROcodone-acetaminophen, celecoxib, Multiple Vitamins, Folic Acid-Vit J1-PHX T05, HYDROcodone-acetaminophen, Coenzyme Q10, gabapentin, metformin, canagliflozin, oxyCODONE-acetaminophen, ezetimibe, Testosterone, mupirocin ointment,  and silver sulfADIAZINE.  Meds ordered this encounter  Medications  . DISCONTD: gabapentin (NEURONTIN) 600 MG tablet    Sig: Take 1 tablet by mouth 3 (three) times daily.  . mupirocin ointment (BACTROBAN) 2 %    Sig:   . DISCONTD: ANDROGEL PUMP 20.25 MG/ACT (1.62%) GEL    Sig:   . silver sulfADIAZINE (SILVADENE) 1 % cream    Sig:      Follow-up: Return in about 3 months (around 03/29/2015).  Claretta Fraise, M.D.

## 2014-12-29 ENCOUNTER — Telehealth: Payer: Self-pay | Admitting: Family Medicine

## 2014-12-30 ENCOUNTER — Encounter: Payer: Self-pay | Admitting: Family Medicine

## 2014-12-30 LAB — CMP14+EGFR
ALBUMIN: 4.7 g/dL (ref 3.5–5.5)
ALT: 17 IU/L (ref 0–44)
AST: 16 IU/L (ref 0–40)
Albumin/Globulin Ratio: 1.7 (ref 1.1–2.5)
Alkaline Phosphatase: 84 IU/L (ref 39–117)
BUN/Creatinine Ratio: 22 — ABNORMAL HIGH (ref 9–20)
BUN: 16 mg/dL (ref 6–24)
Bilirubin Total: 0.7 mg/dL (ref 0.0–1.2)
CALCIUM: 9.7 mg/dL (ref 8.7–10.2)
CO2: 24 mmol/L (ref 18–29)
Chloride: 96 mmol/L — ABNORMAL LOW (ref 97–108)
Creatinine, Ser: 0.72 mg/dL — ABNORMAL LOW (ref 0.76–1.27)
GFR calc Af Amer: 121 mL/min/{1.73_m2} (ref >59)
GFR calc non Af Amer: 105 mL/min/{1.73_m2} (ref >59)
GLOBULIN, TOTAL: 2.8 g/dL (ref 1.5–4.5)
GLUCOSE: 162 mg/dL — AB (ref 65–99)
Potassium: 4.5 mmol/L (ref 3.5–5.2)
Sodium: 136 mmol/L (ref 134–144)
Total Protein: 7.5 g/dL (ref 6.0–8.5)

## 2014-12-30 LAB — HEPATITIS PANEL, ACUTE
Hep A IgM: NEGATIVE
Hep B C IgM: NEGATIVE
Hep C Virus Ab: <0.1 s/co ratio (ref 0.0–0.9)
Hepatitis B Surface Ag: NEGATIVE

## 2014-12-30 LAB — NMR, LIPOPROFILE
Cholesterol: 173 mg/dL (ref 100–199)
HDL CHOLESTEROL BY NMR: 36 mg/dL — AB (ref >39)
HDL Particle Number: 28.1 umol/L — ABNORMAL LOW (ref >=30.5)
LDL PARTICLE NUMBER: 1429 nmol/L — AB (ref <1000)
LDL SIZE: 20.2 nm (ref >20.5)
LDL-C: 107 mg/dL — AB (ref 0–99)
LP-IR Score: 74 — ABNORMAL HIGH (ref <=45)
Small LDL Particle Number: 795 nmol/L — ABNORMAL HIGH (ref <=527)
TRIGLYCERIDES BY NMR: 151 mg/dL — AB (ref 0–149)

## 2014-12-30 LAB — TESTOSTERONE,FREE AND TOTAL
TESTOSTERONE: 316 ng/dL — AB (ref 348–1197)
Testosterone, Free: 8.6 pg/mL (ref 7.2–24.0)

## 2014-12-30 LAB — HIV ANTIBODY (ROUTINE TESTING W REFLEX): HIV SCREEN 4TH GENERATION: NONREACTIVE

## 2014-12-30 LAB — VITAMIN D 25 HYDROXY (VIT D DEFICIENCY, FRACTURES): Vit D, 25-Hydroxy: 51.7 ng/mL (ref 30.0–100.0)

## 2014-12-30 LAB — RPR: RPR Ser Ql: NONREACTIVE

## 2014-12-30 NOTE — Telephone Encounter (Signed)
This was printed on 12/28/14?

## 2014-12-31 ENCOUNTER — Other Ambulatory Visit: Payer: Self-pay | Admitting: *Deleted

## 2014-12-31 MED ORDER — TESTOSTERONE 20.25 MG/ACT (1.62%) TD GEL: 2.0000 | Freq: Every day | TRANSDERMAL | Status: DC

## 2014-12-31 NOTE — Progress Notes (Signed)
RX printed and put in Dr Darlyn ReadStacks basket to sign

## 2015-01-02 NOTE — Telephone Encounter (Signed)
Normally Androgel scripts can't be sent electronically or by fax. I did [facility]  Paper script shredded.

## 2015-01-14 ENCOUNTER — Encounter: Payer: Self-pay | Admitting: Family Medicine

## 2015-01-16 ENCOUNTER — Other Ambulatory Visit: Payer: Self-pay | Admitting: *Deleted

## 2015-01-16 MED ORDER — TRAMADOL HCL 50 MG PO TABS: 100.0000 mg | ORAL_TABLET | Freq: Three times a day (TID) | ORAL | Status: DC

## 2015-01-16 MED ORDER — TRAMADOL HCL 50 MG PO TABS: 100.0000 mg | ORAL_TABLET | Freq: Three times a day (TID) | ORAL | Status: DC | PRN

## 2015-01-16 NOTE — Progress Notes (Signed)
RXs printed for pt pick up Okayed per Dr Darlyn ReadStacks Pt notified

## 2015-02-04 ENCOUNTER — Other Ambulatory Visit: Payer: Self-pay | Admitting: Family Medicine

## 2015-02-15 DEATH — deceased

## 2015-02-23 ENCOUNTER — Other Ambulatory Visit: Payer: Self-pay | Admitting: Family Medicine

## 2015-03-14 ENCOUNTER — Encounter: Payer: BLUE CROSS/BLUE SHIELD | Admitting: Family Medicine

## 2015-03-21 ENCOUNTER — Encounter: Payer: Self-pay | Admitting: *Deleted

## 2015-04-24 ENCOUNTER — Encounter: Payer: BLUE CROSS/BLUE SHIELD | Admitting: Family Medicine

## 2015-04-26 ENCOUNTER — Encounter (INDEPENDENT_AMBULATORY_CARE_PROVIDER_SITE_OTHER): Payer: Self-pay

## 2015-04-26 ENCOUNTER — Encounter: Payer: Self-pay | Admitting: Family Medicine

## 2015-04-26 ENCOUNTER — Ambulatory Visit (INDEPENDENT_AMBULATORY_CARE_PROVIDER_SITE_OTHER): Payer: BLUE CROSS/BLUE SHIELD | Admitting: Family Medicine

## 2015-04-26 VITALS — BP 133/72 | HR 73 | Temp 97.2°F | Ht 67.5 in | Wt 272.0 lb

## 2015-04-26 DIAGNOSIS — Z0000 Encounter for general adult medical examination without abnormal findings: Secondary | ICD-10-CM

## 2015-04-26 DIAGNOSIS — E785 Hyperlipidemia, unspecified: Secondary | ICD-10-CM | POA: Diagnosis not present

## 2015-04-26 DIAGNOSIS — I10 Essential (primary) hypertension: Secondary | ICD-10-CM | POA: Diagnosis not present

## 2015-04-26 DIAGNOSIS — E11621 Type 2 diabetes mellitus with foot ulcer: Secondary | ICD-10-CM | POA: Diagnosis not present

## 2015-04-26 DIAGNOSIS — L97509 Non-pressure chronic ulcer of other part of unspecified foot with unspecified severity: Secondary | ICD-10-CM | POA: Diagnosis not present

## 2015-04-26 DIAGNOSIS — Z1212 Encounter for screening for malignant neoplasm of rectum: Secondary | ICD-10-CM

## 2015-04-26 LAB — POCT URINALYSIS DIPSTICK
Bilirubin, UA: NEGATIVE
Glucose, UA: NEGATIVE
Ketones, UA: NEGATIVE
LEUKOCYTES UA: NEGATIVE
Nitrite, UA: NEGATIVE
Protein, UA: NEGATIVE
RBC UA: NEGATIVE
Spec Grav, UA: 1.01
UROBILINOGEN UA: NEGATIVE
pH, UA: 7

## 2015-04-26 LAB — POCT UA - MICROSCOPIC ONLY
Bacteria, U Microscopic: NEGATIVE
Casts, Ur, LPF, POC: NEGATIVE
Crystals, Ur, HPF, POC: NEGATIVE
Mucus, UA: NEGATIVE
RBC, urine, microscopic: NEGATIVE
WBC, UR, HPF, POC: NEGATIVE
YEAST UA: NEGATIVE

## 2015-04-26 LAB — POCT GLYCOSYLATED HEMOGLOBIN (HGB A1C): HEMOGLOBIN A1C: 7.6

## 2015-04-26 MED ORDER — HYDROCODONE-ACETAMINOPHEN 5-325 MG PO TABS: 1.0000 | ORAL_TABLET | Freq: Four times a day (QID) | ORAL | Status: DC | PRN

## 2015-04-26 MED ORDER — TRAMADOL HCL 50 MG PO TABS: 100.0000 mg | ORAL_TABLET | Freq: Three times a day (TID) | ORAL | Status: DC

## 2015-04-26 NOTE — Progress Notes (Signed)
Subjective:  Patient ID: Alexander Pennington, male    DOB: Jul 21, 1961  Age: 54 y.o. MRN: [id number]  CC: Annual Exam   HPI Alexander Pennington presents for stopped pravastatin- neck pain. Recurred after stopping after 2 cycles.Co Q10 didn't help.  Follow-up of diabetes. Patient does check blood sugar at home readings in mid 100s mostly. Forgot to bring his log sheets. Patient denies symptoms such as polyuria, polydipsia, excessive hunger, nausea No significant hypoglycemic spells noted. Medications as noted below. Taking them regularly without complication/adverse reaction being reported today.    follow-up of hypertension. Patient has no history of headache chest pain or shortness of breath or recent cough. Patient also denies symptoms of TIA such as numbness weakness lateralizing. Patient checks  blood pressure at home and has not had any elevated readings recently. Patient denies side effects from his medication. States taking it regularly.  History Alexander Pennington has a past medical history of Hemorrhagic stroke; Hypertension; Neuropathy; Murmur, heart (Fall 2014 - diagnosed); Obesity; Sleep apnea; Diabetes mellitus; Diabetic Charcot foot; Kidney stones; Family history of anesthesia complication; CAD (coronary artery disease); Hyperlipidemia; Charcot foot due to diabetes mellitus; and Complication of anesthesia.   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   His family history includes Heart attack in his father.He reports that he quit smoking about 26 years ago. His smoking use included Cigarettes. He has never used smokeless tobacco. He reports that he drinks alcohol. He reports that he  does not use illicit drugs.  Outpatient Prescriptions Prior to Visit  Medication Sig Dispense Refill  . canagliflozin (INVOKANA) 300 MG TABS tablet Take 300 mg by mouth daily before breakfast.    . celecoxib (CELEBREX) 200 MG capsule Take 200 mg by mouth 2 (two) times daily.    . Coenzyme Q10 200 MG capsule Take 1 capsule (200 mg total) by mouth daily.    [name] [name] 2.5-25-1 MG TABS tablet TAKE 1 TABLET DAILY 90 tablet 0  . metformin ([name]) 1000 MG (OSM) 24 hr tablet Take 2,000 mg by mouth daily with breakfast.     . Multiple Vitamins tablet Take 1 tablet by mouth daily.    . mupirocin ointment (BACTROBAN) 2 %     . silver sulfADIAZINE (SILVADENE) 1 % cream     . Testosterone (ANDROGEL PUMP) 20.25 MG/ACT (1.62%) GEL Place 2 Applicatorfuls onto the skin daily. 225 g 1  . valsartan (DIOVAN) 320 MG tablet Take 320 mg by mouth daily.    [name] gabapentin (NEURONTIN) 300 MG capsule Take 600 mg by mouth 3 (three) times daily.    [name] HYDROcodone-acetaminophen (NORCO) 5-325 MG per tablet Take 1 tablet by mouth every 6 (six) hours as needed. 90 tablet 0  . traMADol (ULTRAM) 50 MG tablet Take 2 tablets (100 mg total) by mouth 3 (three) times daily. 540 tablet 0  . ezetimibe (ZETIA) 10 MG tablet Take 1 tablet (10 mg total) by mouth daily. (Patient not taking: Reported on 04/26/2015) 30 tablet 0  . Vitamin D, Ergocalciferol, (DRISDOL) 50000 UNITS CAPS capsule TAKE 1 CAPSULE TWO TIMES A WEEK (Patient not taking: Reported on 04/26/2015) 16 capsule 0  . oxyCODONE-acetaminophen (ROXICET) 5-325 MG per tablet Take 1 tablet by mouth every 4 (four) hours as needed for severe pain. (Patient  not taking: Reported on 04/26/2015) 60 tablet 0  . traMADol (ULTRAM) 50 MG tablet Take 2 tablets (100 mg total) by mouth every 8 (eight) hours as needed. (Patient not taking: Reported on 04/26/2015) 60 tablet 0   No facility-administered medications prior to visit.    ROS Review of Systems  Constitutional: Negative for fever,  chills, diaphoresis, activity change, appetite change, fatigue and unexpected weight change.  HENT: Negative for congestion, ear pain, hearing loss, postnasal drip, rhinorrhea, sore throat, tinnitus and trouble swallowing.   Eyes: Negative for photophobia, pain, discharge and redness.  Respiratory: Negative for apnea, cough, choking, chest tightness, shortness of breath, wheezing and stridor.   Cardiovascular: Negative for chest pain, palpitations and leg swelling.  Gastrointestinal: Negative for nausea, vomiting, abdominal pain, diarrhea, constipation, blood in stool and abdominal distention.  Endocrine: Negative for cold intolerance, heat intolerance, polydipsia, polyphagia and polyuria.  Genitourinary: Negative for dysuria, urgency, frequency, hematuria, flank pain, enuresis, difficulty urinating and genital sores.  Musculoskeletal: Negative for joint swelling and arthralgias.       Patient is under the care of orthopedic fot specialist due to foot ulcers. He has had 3 toe amputations this year. He has an open wound that he is having debrided regularly by the fosurgeon. At this time he declines foot exam.  Skin: Negative for color change, rash and wound.  Allergic/Immunologic: Negative for immunocompromised state.  Neurological: Negative for dizziness, tremors, seizures, syncope, facial asymmetry, speech difficulty, weakness, light-headedness, numbness and headaches.  Hematological: Does not bruise/bleed easily.  Psychiatric/Behavioral: Negative for suicidal ideas, hallucinations, behavioral problems, confusion, sleep disturbance, dysphoric mood, decreased concentration and agitation. The patient is not nervous/anxious and is not hyperactive.     Objective:  BP 133/72 mmHg  Pulse 73  Temp(Src) 97.2 F (36.2 C) (Oral)  Ht 5' 7.5" (1.715 m)  Wt 272 lb (123.378 kg)  BMI 41.95 kg/m2  BP Readings from Last 3 Encounters:  04/26/15 133/72  12/28/14 142/81  11/04/14 125/66    Wt Readings  from Last 3 Encounters:  04/26/15 272 lb (123.378 kg)  12/28/14 255 lb (115.667 kg)  11/04/14 255 lb (115.667 kg)     Physical Exam  Constitutional: He is oriented to person, place, and time. He appears well-developed and well-nourished.  HENT:  Head: Normocephalic and atraumatic.  Mouth/Throat: Oropharynx is clear and moist.  Eyes: EOM are normal. Pupils are equal, round, and reactive to light.  Neck: Normal range of motion. No tracheal deviation present. No thyromegaly present.  Cardiovascular: Normal rate, regular rhythm and normal heart sounds.  Exam reveals no gallop and no friction rub.   No murmur heard. Pulmonary/Chest: Breath sounds normal. He has no wheezes. He has no rales.  Abdominal: Soft. He exhibits no mass. There is no tenderness.  Musculoskeletal: Normal range of motion. He exhibits no edema.  Neurological: He is alert and oriented to person, place, and time.  Skin: Skin is warm and dry.  Psychiatric: He has a normal mood and affect.    Lab Results  Component Value Date   HGBA1C 7.6 04/26/2015   HGBA1C 6.8 12/28/2014   HGBA1C 8.2* 06/13/2014    Lab Results  Component Value Date   WBC 5.9 04/26/2015   HGB 15.4 12/28/2014   HCT 45.0 04/26/2015   PLT 168 11/04/2014   GLUCOSE 178* 04/26/2015   CHOL 152 04/26/2015   TRIG 102 04/26/2015   HDL 38* 04/26/2015   LDLCALC 94 04/26/2015   ALT 17 04/26/2015   AST  20 04/26/2015   NA 139 04/26/2015   K 4.9 04/26/2015   CL 98 04/26/2015   CREATININE 0.64* 04/26/2015   BUN 14 04/26/2015   CO2 21 04/26/2015   INR 1.01 11/04/2014   HGBA1C 7.6 04/26/2015    No results found.  Assessment & Plan:   Alexander Pennington was seen today for annual exam.  Diagnoses and all orders for this visit:  Screening for malignant neoplasm of the rectum -     Fecal occult blood, imunochemical  Essential hypertension -     Cancel: POCT CBC -     CMP14+EGFR -     Cancel: CBC with Differential/Platelet -     CBC with  Differential/Platelet  Type 2 diabetes mellitus with foot ulcer -     POCT glycosylated hemoglobin (Hb A1C) -     Cancel: CBC with Differential/Platelet -     CBC with Differential/Platelet -     POCT urinalysis dipstick -     POCT UA - Microscopic Only  Dyslipidemia -     Lipid panel -     Cancel: CBC with Differential/Platelet -     CBC with Differential/Platelet  Wellness examination  Other orders -     HYDROcodone-acetaminophen (NORCO) 5-325 MG per tablet; Take 1 tablet by mouth every 6 (six) hours as needed. -     traMADol (ULTRAM) 50 MG tablet; Take 2 tablets (100 mg total) by mouth 3 (three) times daily.   I have discontinued Alexander Pennington oxyCODONE-acetaminophen. I am also having him maintain his valsartan, celecoxib, Multiple Vitamins, Coenzyme Q10, metformin, canagliflozin, ezetimibe, mupirocin ointment, silver sulfADIAZINE, Testosterone, Vitamin D (Ergocalciferol), [name], gabapentin, HYDROcodone-acetaminophen, and traMADol.  Meds ordered this encounter  Medications  . gabapentin (NEURONTIN) 600 MG tablet    Sig:   . HYDROcodone-acetaminophen (NORCO) 5-325 MG per tablet    Sig: Take 1 tablet by mouth every 6 (six) hours as needed.    Dispense:  90 tablet    Refill:  0  . traMADol (ULTRAM) 50 MG tablet    Sig: Take 2 tablets (100 mg total) by mouth 3 (three) times daily.    Dispense:  540 tablet    Refill:  0  anticipatory guidance: Pt. Agrees to daily exercise of 1 hour or more at a moderate level to help with diabetes control.additionally we talked about safety such as wearing seat belt when riding in a car. Avoiding alcohol and tobacco. Safe sex practices. Diabetic diet reviewed.Vaccines reviewed. Flu shot due at next visit.  Follow-up: Return in about 3 months (around 07/27/2015).  Claretta Fraise, M.D.

## 2015-04-27 LAB — CMP14+EGFR
A/G RATIO: 1.8 (ref 1.1–2.5)
ALK PHOS: 85 IU/L (ref 39–117)
ALT: 17 IU/L (ref 0–44)
AST: 20 IU/L (ref 0–40)
Albumin: 4.5 g/dL (ref 3.5–5.5)
BILIRUBIN TOTAL: 0.5 mg/dL (ref 0.0–1.2)
BUN/Creatinine Ratio: 22 — ABNORMAL HIGH (ref 9–20)
BUN: 14 mg/dL (ref 6–24)
CO2: 21 mmol/L (ref 18–29)
Calcium: 9.3 mg/dL (ref 8.7–10.2)
Chloride: 98 mmol/L (ref 97–108)
Creatinine, Ser: 0.64 mg/dL — ABNORMAL LOW (ref 0.76–1.27)
GFR calc Af Amer: 127 mL/min/{1.73_m2} (ref >59)
GFR calc non Af Amer: 109 mL/min/{1.73_m2} (ref >59)
GLUCOSE: 178 mg/dL — AB (ref 65–99)
Globulin, Total: 2.5 g/dL (ref 1.5–4.5)
POTASSIUM: 4.9 mmol/L (ref 3.5–5.2)
Sodium: 139 mmol/L (ref 134–144)
TOTAL PROTEIN: 7 g/dL (ref 6.0–8.5)

## 2015-04-27 LAB — CBC WITH DIFFERENTIAL/PLATELET
BASOS ABS: 0 10*3/uL (ref 0.0–0.2)
Basos: 1 %
EOS (ABSOLUTE): 0.2 10*3/uL (ref 0.0–0.4)
Eos: 4 %
Hematocrit: 45 % (ref 37.5–51.0)
Hemoglobin: 15.6 g/dL (ref 12.6–17.7)
IMMATURE GRANS (ABS): 0 10*3/uL (ref 0.0–0.1)
Immature Granulocytes: 0 %
Lymphocytes Absolute: 1.4 10*3/uL (ref 0.7–3.1)
Lymphs: 24 %
MCH: 29.6 pg (ref 26.6–33.0)
MCHC: 34.7 g/dL (ref 31.5–35.7)
MCV: 85 fL (ref 79–97)
MONOCYTES: 10 %
MONOS ABS: 0.6 10*3/uL (ref 0.1–0.9)
NEUTROS ABS: 3.7 10*3/uL (ref 1.4–7.0)
Neutrophils: 61 %
Platelets: 168 10*3/uL (ref 150–379)
RBC: 5.27 x10E6/uL (ref 4.14–5.80)
RDW: 13.6 % (ref 12.3–15.4)
WBC: 5.9 10*3/uL (ref 3.4–10.8)

## 2015-04-27 LAB — LIPID PANEL
CHOL/HDL RATIO: 4 ratio (ref 0.0–5.0)
Cholesterol, Total: 152 mg/dL (ref 100–199)
HDL: 38 mg/dL — AB (ref >39)
LDL Calculated: 94 mg/dL (ref 0–99)
Triglycerides: 102 mg/dL (ref 0–149)
VLDL Cholesterol Cal: 20 mg/dL (ref 5–40)

## 2015-04-28 LAB — FECAL OCCULT BLOOD, IMMUNOCHEMICAL: FECAL OCCULT BLD: NEGATIVE

## 2015-05-10 ENCOUNTER — Telehealth: Payer: Self-pay | Admitting: Family Medicine

## 2015-05-11 MED ORDER — TESTOSTERONE 20.25 MG/ACT (1.62%) TD GEL: 2.0000 | Freq: Every day | TRANSDERMAL | Status: DC

## 2015-05-11 MED ORDER — EZETIMIBE 10 MG PO TABS: 10.0000 mg | ORAL_TABLET | Freq: Every day | ORAL | Status: DC

## 2015-05-11 NOTE — Telephone Encounter (Signed)
[employer] for androgel. Zetia sent. Please call in the androgel

## 2015-05-11 NOTE — Telephone Encounter (Signed)
Please review and advise.

## 2015-05-24 ENCOUNTER — Other Ambulatory Visit: Payer: Self-pay | Admitting: Family Medicine

## 2015-06-02 ENCOUNTER — Other Ambulatory Visit: Payer: Self-pay | Admitting: Family Medicine

## 2015-06-02 MED ORDER — EZETIMIBE 10 MG PO TABS: 10.0000 mg | ORAL_TABLET | Freq: Every day | ORAL | Status: DC

## 2015-06-02 NOTE — Telephone Encounter (Signed)
Patient aware.

## 2015-06-02 NOTE — Telephone Encounter (Signed)
Blood work looked excellent overall. His HDL to total cholesterol ratio shows less than average risk for heart and cerebrovascular disease. His A1c was discussed at the office but it was 7.6 which is mildly elevated. The goal should be 6.5 or less. Otherwise the blood work looked very good. I renewed his Zetia for an entire year. I signed off on his CPAP prescription this morning .

## 2015-06-28 ENCOUNTER — Other Ambulatory Visit: Payer: Self-pay | Admitting: Family Medicine

## 2015-07-30 DIAGNOSIS — E1142 Type 2 diabetes mellitus with diabetic polyneuropathy: Secondary | ICD-10-CM | POA: Insufficient documentation

## 2015-07-30 NOTE — Progress Notes (Signed)
Subjective:  Patient ID: Alexander Pennington, male    DOB: Jun 19, 1961  Age: 54 y.o. MRN: [id number]  CC: Diabetes, hyperlipidemia, hypertension, chronic pain and neuropathy  HPI Alexander Pennington presents for  follow-up of hypertension. Patient has no history of headache chest pain or shortness of breath or recent cough. Patient also denies symptoms of TIA such as numbness weakness lateralizing. Patient checks  blood pressure at home and has not had any elevated readings recently. Patient denies side effects from his medication. States taking it regularly.  Patient also  in for follow-up of elevated cholesterol. Doing well without complaints on current medication. Denies side effects of Zetia including abdominal pain and nausea. Also in today for liver function testing. Currently no chest pain, shortness of breath or other cardiovascular related symptoms noted.  Follow-up of diabetes. Patient does not check blood sugar at home.Working on  It via DC of alcohol. Eating more "Nuts, fruits greens & beans." Patient denies symptoms such as polyuria, polydipsia, excessive hunger, nausea No significant hypoglycemic spells noted. Has chronic diabetic neuropathy of both lower extremities for which he takes gabapentin. Medications as noted below. Taking them regularly without complication/adverse reaction being reported today. . Charcot disease of R foot blew up  last week due to walking too much. Wound MD tried a new "membrane"   Patient also takes testosterones to 2 for hypogonadism. Due today for CBC and testosterone level.   History Alexander Pennington has a past medical history of Hemorrhagic stroke ([place]); Hypertension; Neuropathy ([place]); Murmur, heart (Fall 2014 - diagnosed); Obesity; Sleep apnea; Diabetes mellitus ([place]); Diabetic Charcot foot ([place]); Kidney stones; Family history of anesthesia complication; CAD (coronary artery disease); Hyperlipidemia; Charcot foot due to diabetes mellitus ([place]); and Complication of  anesthesia.   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   His family history includes Heart attack in his father.He reports that he quit smoking about 27 years ago. His smoking use included Cigarettes. He has never used smokeless tobacco. He reports that he drinks alcohol. He reports that he does not use illicit drugs.  Current Outpatient Prescriptions on File Prior to Visit  Medication Sig Dispense Refill  . celecoxib (CELEBREX) 200 MG capsule TAKE 1 CAPSULE TWICE A DAY 180 capsule 0  . Coenzyme Q10 200 MG capsule Take 1 capsule (200 mg total) by mouth daily.    [name] DIOVAN 320 MG tablet TAKE 1 TABLET DAILY 90 tablet 1  . [name] 2.5-25-1 MG TABS tablet TAKE 1 TABLET DAILY 90 tablet 1  . gabapentin (NEURONTIN) 600 MG tablet TAKE 2 TABLETS THREE TIMES A DAY 540 tablet 0  . HYDROcodone-acetaminophen (NORCO) 5-325 MG per tablet Take 1 tablet by mouth every 6 (six) hours as needed. 90 tablet 0  . INVOKANA 300 MG TABS tablet TAKE 1 TABLET DAILY 90 tablet 1  . metformin ([name]) 1000 MG (OSM) 24 hr tablet TAKE 1 TABLET TWICE A DAY 180 tablet 0  . Multiple Vitamins tablet Take 1 tablet by mouth daily.    . mupirocin ointment (BACTROBAN) 2 %     . silver sulfADIAZINE (SILVADENE) 1 % cream     . Testosterone (ANDROGEL PUMP) 20.25 MG/ACT (1.62%) GEL Place 2 Applicatorfuls onto the skin daily. 225 g 1  . traMADol (ULTRAM) 50 MG  tablet Take 2 tablets (100 mg total) by mouth 3 (three) times daily. 540 tablet 0   No current facility-administered medications on file prior to visit.    ROS Review of Systems  Constitutional: Negative for fever, chills and diaphoresis.  HENT: Negative  for congestion, rhinorrhea and sore throat.   Respiratory: Negative for cough, shortness of breath and wheezing.   Cardiovascular: Negative for chest pain.  Gastrointestinal: Negative for nausea, vomiting, abdominal pain, diarrhea, constipation and abdominal distention.  Genitourinary: Negative for dysuria and frequency.  Musculoskeletal: Negative for joint swelling and arthralgias.  Skin: Negative for rash.  Neurological: Negative for headaches.    Objective:  BP 141/76 mmHg  Pulse 82  Temp(Src) 97.3 F (36.3 C) (Oral)  Ht 5' 7.5" (1.715 m)  Wt 272 lb (123.378 kg)  BMI 41.95 kg/m2  SpO2 97%  BP Readings from Last 3 Encounters:  07/31/15 141/76  04/26/15 133/72  12/28/14 142/81    Wt Readings from Last 3 Encounters:  07/31/15 272 lb (123.378 kg)  04/26/15 272 lb (123.378 kg)  12/28/14 255 lb (115.667 kg)     Physical Exam  Constitutional: He is oriented to person, place, and time. He appears well-developed and well-nourished. No distress.  HENT:  Head: Normocephalic and atraumatic.  Right Ear: External ear normal.  Left Ear: External ear normal.  Nose: Nose normal.  Mouth/Throat: Oropharynx is clear and moist.  Eyes: Conjunctivae and EOM are normal. Pupils are equal, round, and reactive to light.  Neck: Normal range of motion. Neck supple. No thyromegaly present.  Cardiovascular: Normal rate, regular rhythm and normal heart sounds.   No murmur heard. Pulmonary/Chest: Effort normal and breath sounds normal. No respiratory distress. He has no wheezes. He has no rales.  Abdominal: Soft. Bowel sounds are normal. He exhibits no distension. There is no tenderness.  Lymphadenopathy:    He has no cervical adenopathy.  Neurological: He is alert and oriented to person, place, and time. He has normal reflexes.  Skin: Skin is warm and dry.  Psychiatric: He has a normal mood and affect. His behavior is normal. Judgment and thought content normal.    Lab Results  Component  Value Date   HGBA1C 7.6 04/26/2015   HGBA1C 6.8 12/28/2014   HGBA1C 8.2* 06/13/2014    Lab Results  Component Value Date   WBC 5.9 04/26/2015   HGB 15.4 12/28/2014   HCT 45.0 04/26/2015   PLT 168 11/04/2014   GLUCOSE 178* 04/26/2015   CHOL 152 04/26/2015   TRIG 102 04/26/2015   HDL 38* 04/26/2015   LDLCALC 94 04/26/2015   ALT 17 04/26/2015   AST 20 04/26/2015   NA 139 04/26/2015   K 4.9 04/26/2015   CL 98 04/26/2015   CREATININE 0.64* 04/26/2015   BUN 14 04/26/2015   CO2 21 04/26/2015   INR 1.01 11/04/2014   HGBA1C 7.6 04/26/2015    No results found.  Assessment & Plan:   Alexander Pennington was seen today for diabetes, hyperlipidemia, hypertension, peripheral neuropathy and pain.  Diagnoses and all orders for this visit:  Diabetic peripheral neuropathy associated with type 2 diabetes mellitus ([place]) -     CBC with Differential/Platelet -     POCT glycosylated hemoglobin (Hb A1C) -     CMP14+EGFR  Essential hypertension -     CMP14+EGFR  Testosterone deficiency -     CBC with Differential/Platelet -     Testosterone,Free and Total -     Lipid panel  Dyslipidemia  I have discontinued Alexander Pennington Vitamin D (Ergocalciferol), ezetimibe, and ezetimibe. I am also having him maintain his Multiple Vitamins, Coenzyme Q10, mupirocin ointment, silver sulfADIAZINE, HYDROcodone-acetaminophen, traMADol, Testosterone, [name], INVOKANA, celecoxib, DIOVAN, gabapentin, and metformin.  No orders of the defined types were placed in this encounter.     Follow-up: No Follow-up on file.  Claretta Fraise, M.D.

## 2015-07-31 ENCOUNTER — Ambulatory Visit (INDEPENDENT_AMBULATORY_CARE_PROVIDER_SITE_OTHER): Payer: BLUE CROSS/BLUE SHIELD | Admitting: Family Medicine

## 2015-07-31 ENCOUNTER — Other Ambulatory Visit: Payer: Self-pay | Admitting: Family Medicine

## 2015-07-31 ENCOUNTER — Encounter: Payer: Self-pay | Admitting: Family Medicine

## 2015-07-31 VITALS — BP 141/76 | HR 82 | Temp 97.3°F | Ht 67.5 in | Wt 272.0 lb

## 2015-07-31 DIAGNOSIS — E1142 Type 2 diabetes mellitus with diabetic polyneuropathy: Secondary | ICD-10-CM

## 2015-07-31 DIAGNOSIS — E349 Endocrine disorder, unspecified: Secondary | ICD-10-CM

## 2015-07-31 DIAGNOSIS — I10 Essential (primary) hypertension: Secondary | ICD-10-CM

## 2015-07-31 DIAGNOSIS — E785 Hyperlipidemia, unspecified: Secondary | ICD-10-CM

## 2015-07-31 DIAGNOSIS — E291 Testicular hypofunction: Secondary | ICD-10-CM | POA: Diagnosis not present

## 2015-07-31 DIAGNOSIS — Z125 Encounter for screening for malignant neoplasm of prostate: Secondary | ICD-10-CM

## 2015-07-31 LAB — POCT GLYCOSYLATED HEMOGLOBIN (HGB A1C): HEMOGLOBIN A1C: 8.1

## 2015-07-31 MED ORDER — GABAPENTIN 600 MG PO TABS: ORAL_TABLET | ORAL | Status: DC

## 2015-07-31 MED ORDER — TRAMADOL HCL 50 MG PO TABS
100.0000 mg | ORAL_TABLET | Freq: Three times a day (TID) | ORAL | Status: DC
Start: 2015-07-31 — End: 2015-11-01

## 2015-07-31 MED ORDER — HYDROCODONE-ACETAMINOPHEN 5-325 MG PO TABS: 1.0000 | ORAL_TABLET | Freq: Four times a day (QID) | ORAL | Status: DC | PRN

## 2015-07-31 MED ORDER — SITAGLIPTIN PHOSPHATE 100 MG PO TABS: 100.0000 mg | ORAL_TABLET | Freq: Every day | ORAL | Status: DC

## 2015-07-31 NOTE — Patient Instructions (Signed)

## 2015-07-31 NOTE — Addendum Note (Signed)
Addended by: Bearl MulberryUTHERFORD, Cassandria Drew K on: 07/31/2015 09:53 AM   Modules accepted: Orders, Medications

## 2015-08-01 LAB — CBC WITH DIFFERENTIAL/PLATELET
Basophils Absolute: 0 10*3/uL (ref 0.0–0.2)
Basos: 1 %
EOS (ABSOLUTE): 0.3 10*3/uL (ref 0.0–0.4)
EOS: 3 %
HEMATOCRIT: 44.4 % (ref 37.5–51.0)
HEMOGLOBIN: 15.2 g/dL (ref 12.6–17.7)
Immature Grans (Abs): 0 10*3/uL (ref 0.0–0.1)
Immature Granulocytes: 0 %
LYMPHS ABS: 1.2 10*3/uL (ref 0.7–3.1)
Lymphs: 16 %
MCH: 29.2 pg (ref 26.6–33.0)
MCHC: 34.2 g/dL (ref 31.5–35.7)
MCV: 85 fL (ref 79–97)
MONOCYTES: 6 %
Monocytes Absolute: 0.5 10*3/uL (ref 0.1–0.9)
Neutrophils Absolute: 5.9 10*3/uL (ref 1.4–7.0)
Neutrophils: 74 %
Platelets: 181 10*3/uL (ref 150–379)
RBC: 5.2 x10E6/uL (ref 4.14–5.80)
RDW: 14.1 % (ref 12.3–15.4)
WBC: 7.9 10*3/uL (ref 3.4–10.8)

## 2015-08-01 LAB — CMP14+EGFR
ALBUMIN: 4.4 g/dL (ref 3.5–5.5)
ALT: 16 IU/L (ref 0–44)
AST: 19 IU/L (ref 0–40)
Albumin/Globulin Ratio: 1.7 (ref 1.1–2.5)
Alkaline Phosphatase: 89 IU/L (ref 39–117)
BILIRUBIN TOTAL: 0.6 mg/dL (ref 0.0–1.2)
BUN / CREAT RATIO: 29 — AB (ref 9–20)
BUN: 19 mg/dL (ref 6–24)
CALCIUM: 9.6 mg/dL (ref 8.7–10.2)
CO2: 23 mmol/L (ref 18–29)
CREATININE: 0.65 mg/dL — AB (ref 0.76–1.27)
Chloride: 99 mmol/L (ref 97–106)
GFR calc non Af Amer: 109 mL/min/{1.73_m2} (ref >59)
GFR, EST AFRICAN AMERICAN: 126 mL/min/{1.73_m2} (ref >59)
GLOBULIN, TOTAL: 2.6 g/dL (ref 1.5–4.5)
Glucose: 159 mg/dL — ABNORMAL HIGH (ref 65–99)
Potassium: 4.6 mmol/L (ref 3.5–5.2)
SODIUM: 138 mmol/L (ref 136–144)
Total Protein: 7 g/dL (ref 6.0–8.5)

## 2015-08-01 LAB — LIPID PANEL
CHOLESTEROL TOTAL: 173 mg/dL (ref 100–199)
Chol/HDL Ratio: 4.7 ratio units (ref 0.0–5.0)
HDL: 37 mg/dL — ABNORMAL LOW (ref >39)
LDL Calculated: 113 mg/dL — ABNORMAL HIGH (ref 0–99)
TRIGLYCERIDES: 117 mg/dL (ref 0–149)
VLDL CHOLESTEROL CAL: 23 mg/dL (ref 5–40)

## 2015-08-01 LAB — PSA, TOTAL AND FREE
PSA FREE: 0.13 ng/mL
PSA, Free Pct: 32.5 %
Prostate Specific Ag, Serum: 0.4 ng/mL (ref 0.0–4.0)

## 2015-08-01 LAB — TESTOSTERONE,FREE AND TOTAL
TESTOSTERONE FREE: 9.4 pg/mL (ref 7.2–24.0)
Testosterone: 372 ng/dL (ref 348–1197)

## 2015-08-03 ENCOUNTER — Telehealth: Payer: Self-pay | Admitting: Family Medicine

## 2015-08-03 ENCOUNTER — Encounter: Payer: Self-pay | Admitting: *Deleted

## 2015-08-03 NOTE — Telephone Encounter (Signed)
Reviewed results with pt and he requested a copy get mailed to him. Results printed and mailed to pt.

## 2015-09-24 ENCOUNTER — Other Ambulatory Visit: Payer: Self-pay | Admitting: Family Medicine

## 2015-11-01 ENCOUNTER — Encounter: Payer: Self-pay | Admitting: Family Medicine

## 2015-11-01 ENCOUNTER — Ambulatory Visit (INDEPENDENT_AMBULATORY_CARE_PROVIDER_SITE_OTHER): Payer: BLUE CROSS/BLUE SHIELD | Admitting: Family Medicine

## 2015-11-01 VITALS — BP 138/77 | HR 76 | Temp 97.6°F | Ht 67.5 in | Wt 266.0 lb

## 2015-11-01 DIAGNOSIS — Z79899 Other long term (current) drug therapy: Secondary | ICD-10-CM

## 2015-11-01 DIAGNOSIS — F1120 Opioid dependence, uncomplicated: Secondary | ICD-10-CM | POA: Diagnosis not present

## 2015-11-01 DIAGNOSIS — E349 Endocrine disorder, unspecified: Secondary | ICD-10-CM

## 2015-11-01 DIAGNOSIS — L97509 Non-pressure chronic ulcer of other part of unspecified foot with unspecified severity: Secondary | ICD-10-CM

## 2015-11-01 DIAGNOSIS — E785 Hyperlipidemia, unspecified: Secondary | ICD-10-CM | POA: Diagnosis not present

## 2015-11-01 DIAGNOSIS — E291 Testicular hypofunction: Secondary | ICD-10-CM | POA: Diagnosis not present

## 2015-11-01 DIAGNOSIS — E1142 Type 2 diabetes mellitus with diabetic polyneuropathy: Secondary | ICD-10-CM | POA: Diagnosis not present

## 2015-11-01 DIAGNOSIS — E11621 Type 2 diabetes mellitus with foot ulcer: Secondary | ICD-10-CM | POA: Diagnosis not present

## 2015-11-01 DIAGNOSIS — Z0289 Encounter for other administrative examinations: Secondary | ICD-10-CM

## 2015-11-01 DIAGNOSIS — I10 Essential (primary) hypertension: Secondary | ICD-10-CM

## 2015-11-01 LAB — POCT GLYCOSYLATED HEMOGLOBIN (HGB A1C): HEMOGLOBIN A1C: 7.6

## 2015-11-01 MED ORDER — TRAMADOL HCL 50 MG PO TABS: 100.0000 mg | ORAL_TABLET | Freq: Three times a day (TID) | ORAL | Status: DC

## 2015-11-01 MED ORDER — HYDROCODONE-ACETAMINOPHEN 5-325 MG PO TABS: 1.0000 | ORAL_TABLET | Freq: Four times a day (QID) | ORAL | Status: DC | PRN

## 2015-11-01 NOTE — Progress Notes (Signed)
Subjective:  Patient ID: Alexander Pennington, male    DOB: 1961-09-04  Age: 55 y.o. MRN: [id number]  CC: Diabetes; Hypertension; Hyperlipidemia; and testosterone deficiency   HPI Alexander Pennington presents for  follow-up of hypertension. Patient has no history of headache chest pain or shortness of breath or recent cough. Patient also denies symptoms of TIA such as numbness weakness lateralizing. Patient checks  blood pressure at home and has not had any elevated readings recently. Patient denies side effects from his medication. States taking it regularly.  Patient also  in for follow-up of elevated cholesterol. Doing well without complaints on current medication. Denies side effects of statin including myalgia and arthralgia and nausea. Also in today for liver function testing. Currently no chest pain, shortness of breath or other cardiovascular related symptoms noted.  Follow-up of diabetes. Patient does not check blood sugar at home.  Patient denies symptoms such as polyuria, polydipsia, excessive hunger, nausea No significant hypoglycemic spells noted. Medications as noted below. Taking them regularly without complication/adverse reaction being reported today.   F/U Testosterone- using it on his legs because he feels that will help with his healing. Shows me a pictue of he ulcer. Now near healed appears in picture to be about 1-1.5 cm with good skin formation about grade two at worst.  Pain assessment: Cause of pain-  Pain location- back, legss and thighs from neropathy of M Pain on scale of 1-10- 3-6 Frequency- constantWhat increases pain-nothing What makes pain Better-meds Effects on ADL - slower, but able to perform Any change in general medical condition-none  Current medications- hydrocone 1 daily, prn tramadol 5-6 a day Effectiveness of current meds- excellent Adverse reactions form pain meds-denied  Pill count performed-No Urine drug screen- Yes    History Alexander Pennington has a past  medical history of Hemorrhagic stroke ([place]); Hypertension; Neuropathy ([place]); Murmur, heart (Fall 2014 - diagnosed); Obesity; Sleep apnea; Diabetes mellitus ([place]); Diabetic Charcot foot ([place]); Kidney stones; Family history of anesthesia complication; CAD (coronary artery disease); Hyperlipidemia; Charcot foot due to diabetes mellitus ([place]); and Complication of anesthesia.   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   His family history includes Heart attack in his father.He reports that he quit smoking about 27 years ago. His smoking use included Cigarettes. He has never used smokeless tobacco. He reports that he drinks alcohol. He reports that he does not use illicit drugs.  Current Outpatient Prescriptions on File Prior to Visit  Medication Sig Dispense Refill  . celecoxib (CELEBREX) 200 MG capsule TAKE 1 CAPSULE TWICE A DAY 180 capsule 0  . Coenzyme Q10 200 MG capsule Take 1 capsule (200 mg total) by mouth daily.    [name] DIOVAN 320 MG tablet TAKE 1 TABLET DAILY 90 tablet 1  . [name] 2.5-25-1 MG TABS tablet TAKE 1 TABLET DAILY 90 tablet 1  . gabapentin (NEURONTIN) 600 MG tablet TAKE 2 TABLETS THREE TIMES A DAY 540 tablet 1  . INVOKANA 300 MG TABS tablet TAKE 1 TABLET DAILY 90 tablet 1  . metformin ([name]) 1000 MG (OSM) 24 hr tablet TAKE 1 TABLET TWICE A DAY 180 tablet 0  . Multiple Vitamins tablet Take 1 tablet by mouth daily.    . mupirocin ointment (BACTROBAN) 2 %     .  silver sulfADIAZINE (SILVADENE) 1 % cream     . Testosterone (ANDROGEL PUMP) 20.25 MG/ACT (1.62%) GEL Place 2 Applicatorfuls onto the skin daily. 225 g 1  . sitaGLIPtin (JANUVIA) 100 MG tablet Take 1  tablet (100 mg total) by mouth daily. (Patient not taking: Reported on 11/01/2015) 90 tablet 1   No current facility-administered medications on file prior to visit.    ROS Review of Systems  Constitutional: Negative for fever, chills, diaphoresis and unexpected weight change.  HENT: Negative for congestion, hearing loss, rhinorrhea and sore throat.   Eyes: Negative for visual disturbance.  Respiratory: Negative for cough and shortness of breath.   Cardiovascular: Negative for chest pain.  Gastrointestinal: Negative for abdominal pain, diarrhea and constipation.  Genitourinary: Negative for dysuria and flank pain.  Musculoskeletal: Negative for joint swelling and arthralgias.  Skin: Negative for rash.  Neurological: Negative for dizziness and headaches.  Psychiatric/Behavioral: Negative for sleep disturbance and dysphoric mood.    Objective:  BP 138/77 mmHg  Pulse 76  Temp(Src) 97.6 F (36.4 C) (Oral)  Ht 5' 7.5" (1.715 m)  Wt 266 lb (120.657 kg)  BMI 41.02 kg/m2  SpO2 98%  BP Readings from Last 3 Encounters:  11/01/15 138/77  07/31/15 141/76  04/26/15 133/72    Wt Readings from Last 3 Encounters:  11/01/15 266 lb (120.657 kg)  07/31/15 272 lb (123.378 kg)  04/26/15 272 lb (123.378 kg)     Physical Exam  Constitutional: He is oriented to person, place, and time. He appears well-developed and well-nourished. No distress.  HENT:  Head: Normocephalic and atraumatic.  Right Ear: External ear normal.  Left Ear: External ear normal.  Nose: Nose normal.  Mouth/Throat: Oropharynx is clear and moist.  Eyes: Conjunctivae and EOM are normal. Pupils are equal, round, and reactive to light.  Neck: Normal range of motion. Neck supple. No thyromegaly present.  Cardiovascular: Normal rate, regular rhythm and normal heart sounds.   No murmur heard. Pulmonary/Chest: Effort normal and breath sounds normal. No respiratory distress. He has no wheezes. He has no rales.  Abdominal:  Soft. Bowel sounds are normal. He exhibits no distension. There is no tenderness.  Lymphadenopathy:    He has no cervical adenopathy.  Neurological: He is alert and oriented to person, place, and time. He has normal reflexes.  Skin: Skin is warm and dry.  Psychiatric: He has a normal mood and affect. His behavior is normal. Judgment and thought content normal.    Lab Results  Component Value Date   HGBA1C 7.6 11/01/2015   HGBA1C 8.1 07/31/2015   HGBA1C 7.6 04/26/2015    Lab Results  Component Value Date   WBC 7.9 07/31/2015   HGB 15.4 12/28/2014   HCT 44.4 07/31/2015   PLT 181 07/31/2015   GLUCOSE 159* 07/31/2015   CHOL 173 07/31/2015   TRIG 117 07/31/2015   HDL 37* 07/31/2015   LDLCALC 113* 07/31/2015   ALT 16 07/31/2015   AST 19 07/31/2015   NA 138 07/31/2015   K 4.6 07/31/2015   CL 99 07/31/2015   CREATININE 0.65* 07/31/2015   BUN 19 07/31/2015   CO2 23 07/31/2015   INR 1.01 11/04/2014   HGBA1C 7.6 11/01/2015    No results found.  Assessment & Plan:   Tajay was seen today for diabetes, hypertension, hyperlipidemia and testosterone deficiency.  Diagnoses and all orders for this visit:  Testosterone deficiency -     Testosterone,Free and Total  Essential hypertension -  POCT CBC -     CMP14+EGFR  Type 2 diabetes mellitus with foot ulcer (HCC) -     POCT glycosylated hemoglobin (Hb A1C)  Dyslipidemia -     Lipid panel  Diabetic peripheral neuropathy associated with type 2 diabetes mellitus (HCC)  Pain medication agreement signed -     ToxASSURE Select 13 (MW), Urine  Uncomplicated opioid dependence ([place]) -     ToxASSURE Select 13 (MW), Urine  Other orders -     HYDROcodone-acetaminophen (NORCO) 5-325 MG tablet; Take 1 tablet by mouth every 6 (six) hours as needed. -     traMADol (ULTRAM) 50 MG tablet; Take 2 tablets (100 mg total) by mouth 3 (three) times daily.   I am having Mr. Noone maintain his Multiple Vitamins, Coenzyme Q10, mupirocin  ointment, silver sulfADIAZINE, Testosterone, [name], INVOKANA, DIOVAN, gabapentin, sitaGLIPtin, celecoxib, metformin, HYDROcodone-acetaminophen, and traMADol.  Meds ordered this encounter  Medications  . HYDROcodone-acetaminophen (NORCO) 5-325 MG tablet    Sig: Take 1 tablet by mouth every 6 (six) hours as needed.    Dispense:  90 tablet    Refill:  0  . traMADol (ULTRAM) 50 MG tablet    Sig: Take 2 tablets (100 mg total) by mouth 3 (three) times daily.    Dispense:  540 tablet    Refill:  0     Follow-up: Return in about 3 months (around 01/29/2016).  Claretta Fraise, M.D.

## 2015-11-06 LAB — CMP14+EGFR
A/G RATIO: 1.8 (ref 1.1–2.5)
ALBUMIN: 4.5 g/dL (ref 3.5–5.5)
ALK PHOS: 81 IU/L (ref 39–117)
ALT: 16 IU/L (ref 0–44)
AST: 21 IU/L (ref 0–40)
BILIRUBIN TOTAL: 0.4 mg/dL (ref 0.0–1.2)
BUN/Creatinine Ratio: 22 — ABNORMAL HIGH (ref 9–20)
BUN: 14 mg/dL (ref 6–24)
CHLORIDE: 99 mmol/L (ref 96–106)
CO2: 23 mmol/L (ref 18–29)
Calcium: 9.5 mg/dL (ref 8.7–10.2)
Creatinine, Ser: 0.65 mg/dL — ABNORMAL LOW (ref 0.76–1.27)
GFR calc non Af Amer: 109 mL/min/{1.73_m2} (ref >59)
GFR, EST AFRICAN AMERICAN: 126 mL/min/{1.73_m2} (ref >59)
GLUCOSE: 179 mg/dL — AB (ref 65–99)
Globulin, Total: 2.5 g/dL (ref 1.5–4.5)
POTASSIUM: 4.7 mmol/L (ref 3.5–5.2)
Sodium: 138 mmol/L (ref 134–144)
TOTAL PROTEIN: 7 g/dL (ref 6.0–8.5)

## 2015-11-06 LAB — LIPID PANEL
CHOLESTEROL TOTAL: 181 mg/dL (ref 100–199)
Chol/HDL Ratio: 4.8 ratio units (ref 0.0–5.0)
HDL: 38 mg/dL — AB (ref >39)
LDL Calculated: 119 mg/dL — ABNORMAL HIGH (ref 0–99)
TRIGLYCERIDES: 119 mg/dL (ref 0–149)
VLDL CHOLESTEROL CAL: 24 mg/dL (ref 5–40)

## 2015-11-06 LAB — TESTOSTERONE,FREE AND TOTAL
TESTOSTERONE: 294 ng/dL — AB (ref 348–1197)
Testosterone, Free: 7 pg/mL — ABNORMAL LOW (ref 7.2–24.0)

## 2015-11-06 LAB — TOXASSURE SELECT 13 (MW), URINE: PDF: 0

## 2015-11-18 ENCOUNTER — Other Ambulatory Visit: Payer: Self-pay | Admitting: Family Medicine

## 2015-11-22 ENCOUNTER — Other Ambulatory Visit: Payer: Self-pay | Admitting: Family Medicine

## 2015-11-22 MED ORDER — TESTOSTERONE 20.25 MG/ACT (1.62%) TD GEL: 2.0000 | Freq: Every day | TRANSDERMAL | Status: DC

## 2015-11-22 NOTE — Telephone Encounter (Signed)
Only seen Stacks, didn't know who to send to but Christell ConstantMoore

## 2015-11-22 NOTE — Telephone Encounter (Signed)
rx called into pharmacy

## 2015-11-24 NOTE — Telephone Encounter (Signed)
Last filled and level tested 11/01/15. Call in if approved, [place] in [place]

## 2015-11-24 NOTE — Telephone Encounter (Signed)
Per notes - continue same dose = moore to sign

## 2015-12-04 ENCOUNTER — Telehealth: Payer: Self-pay | Admitting: Family Medicine

## 2015-12-04 NOTE — Telephone Encounter (Signed)
Patient is requesting a 90 day supply for androgel and tramadol to send it to mail order.

## 2015-12-05 ENCOUNTER — Other Ambulatory Visit: Payer: Self-pay | Admitting: Family Medicine

## 2015-12-05 MED ORDER — TRAMADOL HCL 50 MG PO TABS: 100.0000 mg | ORAL_TABLET | Freq: Three times a day (TID) | ORAL | Status: DC

## 2015-12-05 MED ORDER — TESTOSTERONE 20.25 MG/ACT (1.62%) TD GEL: 2.0000 | Freq: Every day | TRANSDERMAL | Status: DC

## 2015-12-05 NOTE — Telephone Encounter (Signed)
[facility] they will not accept phone prescriptions for controlled substances. They do accept controlled substance that are faxed. Please print and I will fax for the patient. Fax # is [phone]. Patient last received Tramadol # [id number] from [facility] in [place] on 11/22/15 and Androgel 1.62 % gel pump 75g on 11/22/15

## 2015-12-05 NOTE — Telephone Encounter (Signed)
Please call in. These are lower level controlled. If you are uncomfortable with this I can have Joyce GrossKay do so. It really isn't fair to ask him to drive from [place] for something that can easily be called in. [facility]

## 2015-12-05 NOTE — Telephone Encounter (Signed)
A fax would be fine. I will postdate based on recent fills of these..[name]

## 2015-12-06 NOTE — Telephone Encounter (Signed)
[facility] Pt notified

## 2015-12-19 DIAGNOSIS — L97411 Non-pressure chronic ulcer of right heel and midfoot limited to breakdown of skin: Secondary | ICD-10-CM | POA: Diagnosis not present

## 2015-12-22 ENCOUNTER — Other Ambulatory Visit: Payer: Self-pay | Admitting: Family Medicine

## 2016-01-16 DIAGNOSIS — L97411 Non-pressure chronic ulcer of right heel and midfoot limited to breakdown of skin: Secondary | ICD-10-CM | POA: Diagnosis not present

## 2016-01-16 DIAGNOSIS — E11610 Type 2 diabetes mellitus with diabetic neuropathic arthropathy: Secondary | ICD-10-CM | POA: Diagnosis not present

## 2016-01-31 ENCOUNTER — Encounter: Payer: Self-pay | Admitting: Family Medicine

## 2016-01-31 ENCOUNTER — Encounter (INDEPENDENT_AMBULATORY_CARE_PROVIDER_SITE_OTHER): Payer: Self-pay

## 2016-01-31 ENCOUNTER — Ambulatory Visit (INDEPENDENT_AMBULATORY_CARE_PROVIDER_SITE_OTHER): Payer: BLUE CROSS/BLUE SHIELD | Admitting: Family Medicine

## 2016-01-31 VITALS — BP 137/82 | HR 77 | Temp 97.2°F | Ht 68.0 in | Wt 276.0 lb

## 2016-01-31 DIAGNOSIS — Z79899 Other long term (current) drug therapy: Secondary | ICD-10-CM

## 2016-01-31 DIAGNOSIS — E349 Endocrine disorder, unspecified: Secondary | ICD-10-CM

## 2016-01-31 DIAGNOSIS — I10 Essential (primary) hypertension: Secondary | ICD-10-CM

## 2016-01-31 DIAGNOSIS — E1142 Type 2 diabetes mellitus with diabetic polyneuropathy: Secondary | ICD-10-CM | POA: Diagnosis not present

## 2016-01-31 DIAGNOSIS — E785 Hyperlipidemia, unspecified: Secondary | ICD-10-CM | POA: Diagnosis not present

## 2016-01-31 DIAGNOSIS — F1120 Opioid dependence, uncomplicated: Secondary | ICD-10-CM | POA: Diagnosis not present

## 2016-01-31 DIAGNOSIS — E669 Obesity, unspecified: Secondary | ICD-10-CM | POA: Diagnosis not present

## 2016-01-31 DIAGNOSIS — E291 Testicular hypofunction: Secondary | ICD-10-CM | POA: Diagnosis not present

## 2016-01-31 DIAGNOSIS — Z0289 Encounter for other administrative examinations: Secondary | ICD-10-CM | POA: Insufficient documentation

## 2016-01-31 DIAGNOSIS — L97509 Non-pressure chronic ulcer of other part of unspecified foot with unspecified severity: Secondary | ICD-10-CM

## 2016-01-31 DIAGNOSIS — E08621 Diabetes mellitus due to underlying condition with foot ulcer: Secondary | ICD-10-CM | POA: Diagnosis not present

## 2016-01-31 LAB — BAYER DCA HB A1C WAIVED: HB A1C (BAYER DCA - WAIVED): 7.9 % — ABNORMAL HIGH (ref <7.0)

## 2016-01-31 MED ORDER — TRAMADOL HCL 50 MG PO TABS: 100.0000 mg | ORAL_TABLET | Freq: Three times a day (TID) | ORAL | Status: DC

## 2016-01-31 MED ORDER — HYDROCODONE-ACETAMINOPHEN 5-325 MG PO TABS: 1.0000 | ORAL_TABLET | Freq: Four times a day (QID) | ORAL | Status: DC | PRN

## 2016-01-31 NOTE — Progress Notes (Signed)
Subjective:  Patient ID: Alexander Pennington, male    DOB: 08/05/1961  Age: 55 y.o. MRN: [id number]  CC: Hypertension; Diabetes; and Pain   HPI Alexander Pennington presents for  follow-up of hypertension. Patient has no history of headache chest pain or shortness of breath or recent cough. Patient also denies symptoms of TIA such as numbness weakness lateralizing. Patient checks  blood pressure at home and has not had any elevated readings recently. Patient denies side effects from his medication. States taking it regularly.  Patient also  in for follow-up of elevated cholesterol. Doing well without complaints on current medication. Denies side effects of statin including myalgia and arthralgia and nausea. Had to DC zetia due to back pain, myalgia.  Also in today for liver function testing. Currently no chest pain, shortness of breath or other cardiovascular related symptoms noted.  Follow-up of diabetes. Patient does check blood sugar at home. Readings run between 145 and 180 Patient denies symptoms such as polyuria, polydipsia, excessive hunger, nausea No significant hypoglycemic spells noted. Medications as noted below. Taking them regularly without complication/adverse reaction being reported today.   Pain assessment: Cause of pain-  Pain location- lower back Pain on scale of 1-10- 5-6 Frequency- What increases pain-walking a lot What makes pain Better-exercise Effects on ADL - none Any change in general medical condition- diabetic ulcer is healing  Effectiveness of current meds-moderate Adverse reactions form pain meds- none      History Alexander Pennington has a past medical history of Hemorrhagic stroke ([place]); Hypertension; Neuropathy ([place]); Murmur, heart (Fall 2014 - diagnosed); Obesity; Sleep apnea; Diabetes mellitus ([place]); Diabetic Charcot foot ([place]); Kidney stones; Family history of anesthesia complication; CAD (coronary artery disease); Hyperlipidemia; Charcot foot due to diabetes mellitus ([place]);  and Complication of anesthesia.   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   His family history includes Heart attack in his father.He reports that he quit smoking about 27 years ago. His smoking use included Cigarettes. He has never used smokeless tobacco. He reports that he drinks alcohol. He reports that he does not use illicit drugs.  Current Outpatient Prescriptions on File Prior to Visit  Medication Sig Dispense Refill  . celecoxib (CELEBREX) 200 MG capsule TAKE 1 CAPSULE TWICE A DAY 180 capsule 0  . Coenzyme Q10 200 MG capsule Take 1 capsule (200 mg total) by mouth daily.    [name] DIOVAN 320 MG tablet TAKE 1 TABLET DAILY 90 tablet 1  . [name] 2.5-25-1 MG TABS tablet TAKE 1 TABLET DAILY 90 tablet 1  . gabapentin (NEURONTIN) 600 MG tablet TAKE 2 TABLETS THREE TIMES A DAY 540 tablet 1  . INVOKANA 300 MG TABS tablet TAKE 1 TABLET DAILY 90 tablet 1  . metformin ([name]) 1000 MG (OSM) 24 hr tablet TAKE 1 TABLET TWICE A DAY 180 tablet 0  . Multiple Vitamins tablet Take 1 tablet by mouth daily.    . mupirocin ointment (BACTROBAN) 2 %     . silver sulfADIAZINE (SILVADENE) 1 % cream     . Testosterone (ANDROGEL PUMP) 20.25 MG/ACT (1.62%) GEL Place 2 Applicatorfuls onto the skin daily. 225 g 1   No current facility-administered medications on file prior to visit.    ROS Review of Systems  Constitutional: Negative for fever,  chills, diaphoresis and unexpected weight change.  HENT: Negative for congestion, hearing loss, rhinorrhea and sore throat.   Eyes: Negative for visual disturbance.  Respiratory: Negative for cough and shortness of breath.   Cardiovascular: Negative  for chest pain.  Gastrointestinal: Negative for abdominal pain, diarrhea and constipation.  Genitourinary: Negative for dysuria and flank pain.  Musculoskeletal: Negative for joint swelling and arthralgias.  Skin: Negative for rash.  Neurological: Negative for dizziness and headaches.  Psychiatric/Behavioral: Negative for sleep disturbance and dysphoric mood.    Objective:  BP 137/82 mmHg  Pulse 77  Temp(Src) 97.2 F (36.2 C) (Oral)  Ht '5\' 8"'  (1.727 m)  Wt 276 lb (125.193 kg)  BMI 41.98 kg/m2  SpO2 98%  BP Readings from Last 3 Encounters:  01/31/16 137/82  11/01/15 138/77  07/31/15 141/76    Wt Readings from Last 3 Encounters:  01/31/16 276 lb (125.193 kg)  11/01/15 266 lb (120.657 kg)  07/31/15 272 lb (123.378 kg)     Physical Exam  Constitutional: He is oriented to person, place, and time. He appears well-developed and well-nourished. No distress.  Obese   HENT:  Head: Normocephalic and atraumatic.  Right Ear: External ear normal.  Left Ear: External ear normal.  Nose: Nose normal.  Mouth/Throat: Oropharynx is clear and moist.  Eyes: Conjunctivae and EOM are normal. Pupils are equal, round, and reactive to light.  Neck: Normal range of motion. Neck supple. No thyromegaly present.  Cardiovascular: Normal rate, regular rhythm and normal heart sounds.   No murmur heard. Pulmonary/Chest: Effort normal and breath sounds normal. No respiratory distress. He has no wheezes. He has no rales.  Abdominal: Soft. Bowel sounds are normal. He exhibits no distension. There is no tenderness.  Lymphadenopathy:    He has no cervical adenopathy.  Neurological: He is alert and oriented to person, place, and time. He has normal reflexes.  Skin: Skin is warm and dry.  Psychiatric: He has a normal mood and affect. His behavior is normal. Judgment and thought content normal.    Lab Results  Component Value Date   HGBA1C 7.6 11/01/2015   HGBA1C 8.1 07/31/2015   HGBA1C 7.6  04/26/2015    Lab Results  Component Value Date   WBC 7.9 07/31/2015   HGB 15.4 12/28/2014   HCT 44.4 07/31/2015   PLT 181 07/31/2015   GLUCOSE 179* 11/01/2015   CHOL 181 11/01/2015   TRIG 119 11/01/2015   HDL 38* 11/01/2015   LDLCALC 119* 11/01/2015   ALT 16 11/01/2015   AST 21 11/01/2015   NA 138 11/01/2015   K 4.7 11/01/2015   CL 99 11/01/2015   CREATININE 0.65* 11/01/2015   BUN 14 11/01/2015   CO2 23 11/01/2015   INR 1.01 11/04/2014   HGBA1C 7.6 11/01/2015    No results found.  Assessment & Plan:   Alexander Pennington was seen today for hypertension, diabetes and pain.  Diagnoses and all orders for this visit:  Diabetes mellitus due to underlying condition with foot ulcer, without long-term current use of insulin ([place]) -     Bayer DCA Hb A1c Waived -     Microalbumin / creatinine urine ratio  Essential hypertension -     CMP14+EGFR -     POCT CBC  Dyslipidemia -     Lipid panel  Diabetic peripheral neuropathy associated with type 2 diabetes mellitus (HCC)  Obesity  Testosterone deficiency -     Testosterone,Free and Total  Uncomplicated opioid dependence (HCC)  Pain medication  agreement signed  Other orders -     traMADol (ULTRAM) 50 MG tablet; Take 2 tablets (100 mg total) by mouth 3 (three) times daily. -     HYDROcodone-acetaminophen (NORCO) 5-325 MG tablet; Take 1 tablet by mouth every 6 (six) hours as needed.   I have discontinued Alexander Pennington sitaGLIPtin and ZETIA. I am also having him maintain his Multiple Vitamins, Coenzyme Q10, mupirocin ointment, silver sulfADIAZINE, DIOVAN, gabapentin, [name], INVOKANA, Testosterone, celecoxib, metformin, traMADol, and HYDROcodone-acetaminophen.  Meds ordered this encounter  Medications  . DISCONTD: ZETIA 10 MG tablet    Sig: Reported on 01/31/2016  . traMADol (ULTRAM) 50 MG tablet    Sig: Take 2 tablets (100 mg total) by mouth 3 (three) times daily.    Dispense:  540 tablet    Refill:  1    Do not fill until  12/16/2015 (Pt. Current supply should last until 12/23/15 - I am assuming 1 week for shipping, etc.) Thanks, WS  . HYDROcodone-acetaminophen (NORCO) 5-325 MG tablet    Sig: Take 1 tablet by mouth every 6 (six) hours as needed.    Dispense:  90 tablet    Refill:  0     Follow-up: Return in about 3 months (around 05/02/2016).  Claretta Fraise, M.D.

## 2016-02-01 LAB — CMP14+EGFR
A/G RATIO: 1.8 (ref 1.2–2.2)
ALBUMIN: 4.6 g/dL (ref 3.5–5.5)
ALK PHOS: 78 IU/L (ref 39–117)
ALT: 16 IU/L (ref 0–44)
AST: 14 IU/L (ref 0–40)
BILIRUBIN TOTAL: 0.5 mg/dL (ref 0.0–1.2)
BUN/Creatinine Ratio: 24 — ABNORMAL HIGH (ref 9–20)
BUN: 18 mg/dL (ref 6–24)
CHLORIDE: 98 mmol/L (ref 96–106)
CO2: 22 mmol/L (ref 18–29)
Calcium: 9.8 mg/dL (ref 8.7–10.2)
Creatinine, Ser: 0.75 mg/dL — ABNORMAL LOW (ref 0.76–1.27)
GFR calc Af Amer: 119 mL/min/{1.73_m2} (ref >59)
GFR calc non Af Amer: 103 mL/min/{1.73_m2} (ref >59)
GLOBULIN, TOTAL: 2.5 g/dL (ref 1.5–4.5)
GLUCOSE: 203 mg/dL — AB (ref 65–99)
POTASSIUM: 4.9 mmol/L (ref 3.5–5.2)
Sodium: 137 mmol/L (ref 134–144)
TOTAL PROTEIN: 7.1 g/dL (ref 6.0–8.5)

## 2016-02-01 LAB — TESTOSTERONE,FREE AND TOTAL
TESTOSTERONE FREE: 15.8 pg/mL (ref 7.2–24.0)
Testosterone: 461 ng/dL (ref 348–1197)

## 2016-02-01 LAB — CBC WITH DIFFERENTIAL/PLATELET
BASOS: 1 %
Basophils Absolute: 0.1 10*3/uL (ref 0.0–0.2)
EOS (ABSOLUTE): 0.2 10*3/uL (ref 0.0–0.4)
EOS: 3 %
HEMATOCRIT: 47 % (ref 37.5–51.0)
HEMOGLOBIN: 16.2 g/dL (ref 12.6–17.7)
IMMATURE GRANS (ABS): 0 10*3/uL (ref 0.0–0.1)
IMMATURE GRANULOCYTES: 1 %
Lymphocytes Absolute: 1.4 10*3/uL (ref 0.7–3.1)
Lymphs: 21 %
MCH: 30.7 pg (ref 26.6–33.0)
MCHC: 34.5 g/dL (ref 31.5–35.7)
MCV: 89 fL (ref 79–97)
MONOCYTES: 7 %
Monocytes Absolute: 0.5 10*3/uL (ref 0.1–0.9)
Neutrophils Absolute: 4.4 10*3/uL (ref 1.4–7.0)
Neutrophils: 67 %
Platelets: 179 10*3/uL (ref 150–379)
RBC: 5.28 x10E6/uL (ref 4.14–5.80)
RDW: 13.5 % (ref 12.3–15.4)
WBC: 6.5 10*3/uL (ref 3.4–10.8)

## 2016-02-01 LAB — LIPID PANEL
Chol/HDL Ratio: 4.9 ratio units (ref 0.0–5.0)
Cholesterol, Total: 190 mg/dL (ref 100–199)
HDL: 39 mg/dL — AB (ref >39)
LDL Calculated: 120 mg/dL — ABNORMAL HIGH (ref 0–99)
TRIGLYCERIDES: 156 mg/dL — AB (ref 0–149)
VLDL CHOLESTEROL CAL: 31 mg/dL (ref 5–40)

## 2016-02-01 LAB — MICROALBUMIN / CREATININE URINE RATIO
Creatinine, Urine: 30.8 mg/dL
Microalbumin, Urine: <3 ug/mL

## 2016-02-01 NOTE — Progress Notes (Signed)
Check with pt. He is a good candidate for injectable Repatha or praluent. We need him to sign to have old records sent from [facility] documenting his statin intolerance in order to get it approved - assuming he is willing to take the injections.

## 2016-02-01 NOTE — Addendum Note (Signed)
Addended by: Mechele ClaudeSTACKS, Maebry Obrien on: 02/01/2016 01:07 PM   Modules accepted: Kipp BroodSmartSet

## 2016-02-19 DIAGNOSIS — L97411 Non-pressure chronic ulcer of right heel and midfoot limited to breakdown of skin: Secondary | ICD-10-CM | POA: Diagnosis not present

## 2016-02-19 DIAGNOSIS — M19131 Post-traumatic osteoarthritis, right wrist: Secondary | ICD-10-CM | POA: Diagnosis not present

## 2016-02-19 DIAGNOSIS — M19032 Primary osteoarthritis, left wrist: Secondary | ICD-10-CM | POA: Diagnosis not present

## 2016-02-19 DIAGNOSIS — M19031 Primary osteoarthritis, right wrist: Secondary | ICD-10-CM | POA: Diagnosis not present

## 2016-02-19 DIAGNOSIS — E1142 Type 2 diabetes mellitus with diabetic polyneuropathy: Secondary | ICD-10-CM | POA: Diagnosis not present

## 2016-02-19 DIAGNOSIS — E11610 Type 2 diabetes mellitus with diabetic neuropathic arthropathy: Secondary | ICD-10-CM | POA: Diagnosis not present

## 2016-02-19 DIAGNOSIS — G5601 Carpal tunnel syndrome, right upper limb: Secondary | ICD-10-CM | POA: Diagnosis not present

## 2016-02-26 ENCOUNTER — Telehealth: Payer: Self-pay | Admitting: Family Medicine

## 2016-03-02 ENCOUNTER — Other Ambulatory Visit: Payer: Self-pay | Admitting: Family Medicine

## 2016-03-18 DIAGNOSIS — E11610 Type 2 diabetes mellitus with diabetic neuropathic arthropathy: Secondary | ICD-10-CM | POA: Diagnosis not present

## 2016-03-18 DIAGNOSIS — E1142 Type 2 diabetes mellitus with diabetic polyneuropathy: Secondary | ICD-10-CM | POA: Diagnosis not present

## 2016-03-21 DIAGNOSIS — G8929 Other chronic pain: Secondary | ICD-10-CM | POA: Diagnosis not present

## 2016-03-21 DIAGNOSIS — E1140 Type 2 diabetes mellitus with diabetic neuropathy, unspecified: Secondary | ICD-10-CM | POA: Diagnosis not present

## 2016-03-21 DIAGNOSIS — M549 Dorsalgia, unspecified: Secondary | ICD-10-CM | POA: Diagnosis not present

## 2016-03-21 DIAGNOSIS — J45909 Unspecified asthma, uncomplicated: Secondary | ICD-10-CM | POA: Diagnosis not present

## 2016-03-21 DIAGNOSIS — G4733 Obstructive sleep apnea (adult) (pediatric): Secondary | ICD-10-CM | POA: Diagnosis not present

## 2016-03-21 DIAGNOSIS — E11621 Type 2 diabetes mellitus with foot ulcer: Secondary | ICD-10-CM | POA: Diagnosis not present

## 2016-03-21 DIAGNOSIS — Z87891 Personal history of nicotine dependence: Secondary | ICD-10-CM | POA: Diagnosis not present

## 2016-03-21 DIAGNOSIS — Z887 Allergy status to serum and vaccine status: Secondary | ICD-10-CM | POA: Diagnosis not present

## 2016-03-21 DIAGNOSIS — E291 Testicular hypofunction: Secondary | ICD-10-CM | POA: Diagnosis not present

## 2016-03-21 DIAGNOSIS — Z7984 Long term (current) use of oral hypoglycemic drugs: Secondary | ICD-10-CM | POA: Diagnosis not present

## 2016-03-21 DIAGNOSIS — G5601 Carpal tunnel syndrome, right upper limb: Secondary | ICD-10-CM | POA: Diagnosis not present

## 2016-03-21 DIAGNOSIS — Z888 Allergy status to other drugs, medicaments and biological substances status: Secondary | ICD-10-CM | POA: Diagnosis not present

## 2016-03-21 DIAGNOSIS — E669 Obesity, unspecified: Secondary | ICD-10-CM | POA: Diagnosis not present

## 2016-03-21 DIAGNOSIS — F418 Other specified anxiety disorders: Secondary | ICD-10-CM | POA: Diagnosis not present

## 2016-03-21 DIAGNOSIS — I10 Essential (primary) hypertension: Secondary | ICD-10-CM | POA: Diagnosis not present

## 2016-03-21 DIAGNOSIS — E11610 Type 2 diabetes mellitus with diabetic neuropathic arthropathy: Secondary | ICD-10-CM | POA: Diagnosis not present

## 2016-03-22 ENCOUNTER — Other Ambulatory Visit: Payer: Self-pay | Admitting: Nurse Practitioner

## 2016-04-04 ENCOUNTER — Telehealth: Payer: Self-pay | Admitting: Family Medicine

## 2016-04-04 NOTE — Telephone Encounter (Signed)
Call given to nurse °

## 2016-04-15 DIAGNOSIS — E11610 Type 2 diabetes mellitus with diabetic neuropathic arthropathy: Secondary | ICD-10-CM | POA: Diagnosis not present

## 2016-04-15 DIAGNOSIS — L97519 Non-pressure chronic ulcer of other part of right foot with unspecified severity: Secondary | ICD-10-CM | POA: Diagnosis not present

## 2016-04-15 DIAGNOSIS — E1142 Type 2 diabetes mellitus with diabetic polyneuropathy: Secondary | ICD-10-CM | POA: Diagnosis not present

## 2016-04-15 DIAGNOSIS — L97411 Non-pressure chronic ulcer of right heel and midfoot limited to breakdown of skin: Secondary | ICD-10-CM | POA: Diagnosis not present

## 2016-05-15 ENCOUNTER — Ambulatory Visit: Payer: BLUE CROSS/BLUE SHIELD | Admitting: Family Medicine

## 2016-05-18 ENCOUNTER — Other Ambulatory Visit: Payer: Self-pay | Admitting: Family Medicine

## 2016-05-23 ENCOUNTER — Encounter: Payer: Self-pay | Admitting: Family Medicine

## 2016-05-23 ENCOUNTER — Ambulatory Visit (INDEPENDENT_AMBULATORY_CARE_PROVIDER_SITE_OTHER): Payer: BLUE CROSS/BLUE SHIELD | Admitting: Family Medicine

## 2016-05-23 VITALS — BP 140/76 | HR 87 | Temp 97.9°F | Ht 68.0 in | Wt 277.0 lb

## 2016-05-23 DIAGNOSIS — Z79899 Other long term (current) drug therapy: Secondary | ICD-10-CM

## 2016-05-23 DIAGNOSIS — E785 Hyperlipidemia, unspecified: Secondary | ICD-10-CM

## 2016-05-23 DIAGNOSIS — E291 Testicular hypofunction: Secondary | ICD-10-CM

## 2016-05-23 DIAGNOSIS — E349 Endocrine disorder, unspecified: Secondary | ICD-10-CM

## 2016-05-23 DIAGNOSIS — I10 Essential (primary) hypertension: Secondary | ICD-10-CM | POA: Diagnosis not present

## 2016-05-23 DIAGNOSIS — Z0289 Encounter for other administrative examinations: Secondary | ICD-10-CM

## 2016-05-23 DIAGNOSIS — E669 Obesity, unspecified: Secondary | ICD-10-CM

## 2016-05-23 DIAGNOSIS — L97509 Non-pressure chronic ulcer of other part of unspecified foot with unspecified severity: Secondary | ICD-10-CM

## 2016-05-23 DIAGNOSIS — E08621 Diabetes mellitus due to underlying condition with foot ulcer: Secondary | ICD-10-CM | POA: Diagnosis not present

## 2016-05-23 DIAGNOSIS — E1142 Type 2 diabetes mellitus with diabetic polyneuropathy: Secondary | ICD-10-CM | POA: Diagnosis not present

## 2016-05-23 DIAGNOSIS — F1120 Opioid dependence, uncomplicated: Secondary | ICD-10-CM

## 2016-05-23 LAB — BAYER DCA HB A1C WAIVED: HB A1C (BAYER DCA - WAIVED): 8.5 % — ABNORMAL HIGH (ref <7.0)

## 2016-05-23 MED ORDER — HYDROCODONE-ACETAMINOPHEN 5-325 MG PO TABS: 1.0000 | ORAL_TABLET | Freq: Four times a day (QID) | ORAL | 0 refills | Status: DC | PRN

## 2016-05-23 NOTE — Progress Notes (Signed)
Subjective:  Patient ID: Alexander Pennington, male    DOB: 11/18/1960  Age: 55 y.o. MRN: [id number]  CC: 3 month follow up   HPI Alexander Pennington presents for  follow-up of hypertension. Patient has no history of headache chest pain or shortness of breath or recent cough. Patient also denies symptoms of TIA such as numbness weakness lateralizing. Patient checks  blood pressure at home and has not had any elevated readings recently. Patient denies side effects from his medication. States taking it regularly.  Patient also  in for follow-up of elevated cholesterol. Doing well without complaints on current medication. Denies side effects of statin including myalgia and arthralgia and nausea. Had to DC zetia due to back pain, myalgia.  Also in today for liver function testing. Currently no chest pain, shortness of breath or other cardiovascular related symptoms noted.  Follow-up of diabetes. Patient does check blood sugar at home. Readings run between 140 and 190 Patient denies symptoms such as polyuria, polydipsia, excessive hunger, nausea No significant hypoglycemic spells noted. Medications as noted below. Taking them regularly without complication/adverse reaction being reported today.   Pain assessment: Cause of pain- diabetic neuropathy Pain location-  feet Pain on scale of 1-10- 5-6/10 Frequency- 1-2 times weekly What increases pain-walking a lot What makes pain Better- tramadol, gabapentin. Taking 1-2 hydrocodone weekly Effects on ADL - none Any change in general medical condition-none  Effectiveness of current meds-moderate - hydrocodone knocks it out Adverse reactions form pain meds- none  Continues to use the testosterone. He switched to using it on his legs as a trial and says his level was actually higher from using it that way. He is due for testosterone level today.    History Alexander Pennington has a past medical history of CAD (coronary artery disease); Charcot foot due to diabetes mellitus  ([place]); Complication of anesthesia; Diabetes mellitus ([place]); Diabetic Charcot foot ([place]); Family history of anesthesia complication; Hemorrhagic stroke ([place]); Hyperlipidemia; Hypertension; Kidney stones; Murmur, heart (Fall 2014 - diagnosed); Neuropathy ([place]); Obesity; and Sleep apnea.   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   His family history includes Heart attack in his father.He reports that he quit smoking about 27 years ago. His smoking use included Cigarettes. He has never used smokeless tobacco. He reports that he drinks alcohol. He reports that he does not use drugs.  Current Outpatient Prescriptions on File Prior to Visit  Medication Sig Dispense Refill  . celecoxib (CELEBREX) 200 MG capsule TAKE 1 CAPSULE TWICE A DAY 180 capsule 0  . Coenzyme Q10 200 MG capsule Take 1 capsule (200 mg total) by mouth daily.    [name] DIOVAN 320 MG tablet TAKE 1 TABLET DAILY 90 tablet 1  . [name] 2.5-25-1 MG TABS tablet TAKE 1 TABLET DAILY 90 tablet 0  . gabapentin (NEURONTIN) 600 MG tablet TAKE 2 TABLETS THREE TIMES A DAY 540 tablet 0  . INVOKANA 300 MG TABS tablet TAKE 1 TABLET DAILY 90 tablet 0  . metformin ([name]) 1000 MG (OSM) 24 hr tablet TAKE 1 TABLET TWICE A DAY 180 tablet 0  . Multiple Vitamins tablet Take 1 tablet by mouth daily.    . mupirocin ointment (BACTROBAN) 2 %     . silver sulfADIAZINE (SILVADENE) 1 % cream     .  Testosterone (ANDROGEL PUMP) 20.25 MG/ACT (1.62%) GEL Place 2 Applicatorfuls onto the skin daily. 225 g 1  . traMADol (ULTRAM) 50 MG tablet Take 2 tablets (100 mg total) by mouth 3 (three) times daily. 540 tablet 1   No current facility-administered  medications on file prior to visit.     ROS Review of Systems  Constitutional: Negative for chills, diaphoresis, fever and unexpected weight change.  HENT: Negative for congestion, hearing loss, rhinorrhea and sore throat.   Eyes: Negative for visual disturbance.  Respiratory: Negative for cough and shortness of breath.   Cardiovascular: Negative for chest pain.  Gastrointestinal: Negative for abdominal pain, constipation and diarrhea.  Genitourinary: Negative for dysuria and flank pain.  Musculoskeletal: Negative for arthralgias and joint swelling.  Skin: Negative for rash.  Neurological: Negative for dizziness and headaches.  Psychiatric/Behavioral: Negative for dysphoric mood and sleep disturbance.    Objective:  BP 140/76   Pulse 87   Temp 97.9 F (36.6 C) (Oral)   Ht '5\' 8"'  (1.727 m)   Wt 277 lb (125.6 kg)   BMI 42.12 kg/m   BP Readings from Last 3 Encounters:  05/23/16 140/76  01/31/16 137/82  11/01/15 138/77    Wt Readings from Last 3 Encounters:  05/23/16 277 lb (125.6 kg)  01/31/16 276 lb (125.2 kg)  11/01/15 266 lb (120.7 kg)     Physical Exam  Constitutional: He is oriented to person, place, and time. He appears well-developed and well-nourished. No distress.  Obese   HENT:  Head: Normocephalic and atraumatic.  Right Ear: External ear normal.  Left Ear: External ear normal.  Nose: Nose normal.  Mouth/Throat: Oropharynx is clear and moist.  Eyes: Conjunctivae and EOM are normal. Pupils are equal, round, and reactive to light.  Neck: Normal range of motion. Neck supple. No thyromegaly present.  Cardiovascular: Normal rate, regular rhythm and normal heart sounds.   No murmur heard. Pulmonary/Chest: Effort normal and breath sounds normal. No respiratory distress. He has no wheezes. He has no rales.  Abdominal: Soft. Bowel sounds are normal. He exhibits no distension. There is no tenderness.  Lymphadenopathy:    He has no cervical adenopathy.    Neurological: He is alert and oriented to person, place, and time. He has normal reflexes.  Skin: Skin is warm and dry.  Psychiatric: He has a normal mood and affect. His behavior is normal. Judgment and thought content normal.    Lab Results  Component Value Date   HGBA1C 7.6 11/01/2015   HGBA1C 8.1 07/31/2015   HGBA1C 7.6 04/26/2015    Lab Results  Component Value Date   WBC 6.5 01/31/2016   HGB 15.4 12/28/2014   HCT 47.0 01/31/2016   PLT 179 01/31/2016   GLUCOSE 203 (H) 01/31/2016   CHOL 190 01/31/2016   TRIG 156 (H) 01/31/2016   HDL 39 (L) 01/31/2016   LDLCALC 120 (H) 01/31/2016   ALT 16 01/31/2016   AST 14 01/31/2016   NA 137 01/31/2016   K 4.9 01/31/2016   CL 98 01/31/2016   CREATININE 0.75 (L) 01/31/2016   BUN 18 01/31/2016   CO2 22 01/31/2016   INR 1.01 11/04/2014   HGBA1C 7.6 11/01/2015    No results found.  Assessment & Plan:   Alexander Pennington was seen today for 3 month follow up.  Diagnoses and all orders for this visit:  Diabetes mellitus due to underlying condition with foot ulcer, without long-term current use of insulin ([place]) -     Bayer  DCA Hb A1c Waived  Diabetic peripheral neuropathy associated with type 2 diabetes mellitus (HCC) -     Bayer DCA Hb A1c Waived; Standing -     CBC with Differential/Platelet; Standing -     CMP14+EGFR; Standing -     Lipid panel; Standing  Dyslipidemia -     Lipid panel; Standing  Essential hypertension -     CMP14+EGFR; Standing  Obesity  Pain medication agreement signed  Testosterone deficiency -     Testosterone,Free and Total -     Testosterone,Free and Total; Standing  Uncomplicated opioid dependence ([place])  Other orders -     HYDROcodone-acetaminophen (NORCO) 5-325 MG tablet; Take 1 tablet by mouth every 6 (six) hours as needed.   I am having Mr. Weisenberger maintain his Multiple Vitamins, Coenzyme Q10, mupirocin ointment, silver sulfADIAZINE, DIOVAN, Testosterone, traMADol, gabapentin, metformin,  celecoxib, INVOKANA, [name], and HYDROcodone-acetaminophen.  Meds ordered this encounter  Medications  . HYDROcodone-acetaminophen (NORCO) 5-325 MG tablet    Sig: Take 1 tablet by mouth every 6 (six) hours as needed.    Dispense:  90 tablet    Refill:  0     Follow-up: No Follow-up on file.  Claretta Fraise, M.D.

## 2016-05-24 LAB — TESTOSTERONE,FREE AND TOTAL
TESTOSTERONE FREE: 21.2 pg/mL (ref 7.2–24.0)
TESTOSTERONE: 709 ng/dL (ref 264–916)

## 2016-06-02 ENCOUNTER — Other Ambulatory Visit: Payer: Self-pay | Admitting: Family Medicine

## 2016-06-20 ENCOUNTER — Other Ambulatory Visit: Payer: Self-pay | Admitting: Family Medicine

## 2016-06-27 DIAGNOSIS — L989 Disorder of the skin and subcutaneous tissue, unspecified: Secondary | ICD-10-CM | POA: Diagnosis not present

## 2016-07-01 ENCOUNTER — Telehealth: Payer: Self-pay | Admitting: Family Medicine

## 2016-07-01 MED ORDER — TESTOSTERONE 20.25 MG/ACT (1.62%) TD GEL: 2.0000 | Freq: Every day | TRANSDERMAL | 5 refills | Status: DC

## 2016-07-01 NOTE — Addendum Note (Signed)
Addended by: Fawn KirkHOLT, CATHY on: 07/01/2016 04:56 PM   Modules accepted: Orders

## 2016-07-01 NOTE — Telephone Encounter (Signed)
Refill called to [facility]

## 2016-07-01 NOTE — Telephone Encounter (Signed)
Okay at current level for 6 mos. Please call in. Thanks ws

## 2016-07-01 NOTE — Telephone Encounter (Signed)
Pt aware refill has been called to [facility]

## 2016-07-02 ENCOUNTER — Telehealth: Payer: Self-pay | Admitting: *Deleted

## 2016-07-02 DIAGNOSIS — H61031 Chondritis of right external ear: Secondary | ICD-10-CM | POA: Diagnosis not present

## 2016-07-02 DIAGNOSIS — L720 Epidermal cyst: Secondary | ICD-10-CM | POA: Diagnosis not present

## 2016-07-02 DIAGNOSIS — D1801 Hemangioma of skin and subcutaneous tissue: Secondary | ICD-10-CM | POA: Diagnosis not present

## 2016-07-02 DIAGNOSIS — L989 Disorder of the skin and subcutaneous tissue, unspecified: Secondary | ICD-10-CM | POA: Diagnosis not present

## 2016-07-02 MED ORDER — TRAMADOL HCL 50 MG PO TABS: 100.0000 mg | ORAL_TABLET | Freq: Three times a day (TID) | ORAL | 1 refills | Status: DC

## 2016-07-02 NOTE — Telephone Encounter (Signed)
Okay at current level for 6 mos. Thanks ws 

## 2016-07-11 ENCOUNTER — Telehealth: Payer: Self-pay | Admitting: Family Medicine

## 2016-07-11 ENCOUNTER — Other Ambulatory Visit: Payer: Self-pay | Admitting: Family Medicine

## 2016-07-11 NOTE — Telephone Encounter (Signed)
Error

## 2016-07-11 NOTE — Telephone Encounter (Signed)
Patient aware that rx has been called into [facility] by [place].

## 2016-08-19 ENCOUNTER — Other Ambulatory Visit: Payer: Self-pay | Admitting: Family Medicine

## 2016-09-04 ENCOUNTER — Encounter (INDEPENDENT_AMBULATORY_CARE_PROVIDER_SITE_OTHER): Payer: Self-pay

## 2016-09-04 ENCOUNTER — Ambulatory Visit (INDEPENDENT_AMBULATORY_CARE_PROVIDER_SITE_OTHER): Payer: BLUE CROSS/BLUE SHIELD

## 2016-09-04 ENCOUNTER — Encounter: Payer: Self-pay | Admitting: Family Medicine

## 2016-09-04 ENCOUNTER — Ambulatory Visit (INDEPENDENT_AMBULATORY_CARE_PROVIDER_SITE_OTHER): Payer: BLUE CROSS/BLUE SHIELD | Admitting: Family Medicine

## 2016-09-04 VITALS — BP 131/71 | HR 74 | Temp 97.0°F | Ht 68.0 in | Wt 279.0 lb

## 2016-09-04 DIAGNOSIS — I25700 Atherosclerosis of coronary artery bypass graft(s), unspecified, with unstable angina pectoris: Secondary | ICD-10-CM

## 2016-09-04 DIAGNOSIS — E6609 Other obesity due to excess calories: Secondary | ICD-10-CM | POA: Diagnosis not present

## 2016-09-04 DIAGNOSIS — F1120 Opioid dependence, uncomplicated: Secondary | ICD-10-CM

## 2016-09-04 DIAGNOSIS — Z6841 Body Mass Index (BMI) 40.0 and over, adult: Secondary | ICD-10-CM

## 2016-09-04 DIAGNOSIS — IMO0001 Reserved for inherently not codable concepts without codable children: Secondary | ICD-10-CM

## 2016-09-04 DIAGNOSIS — Z0000 Encounter for general adult medical examination without abnormal findings: Secondary | ICD-10-CM

## 2016-09-04 DIAGNOSIS — G4733 Obstructive sleep apnea (adult) (pediatric): Secondary | ICD-10-CM | POA: Diagnosis not present

## 2016-09-04 DIAGNOSIS — R011 Cardiac murmur, unspecified: Secondary | ICD-10-CM

## 2016-09-04 DIAGNOSIS — E1142 Type 2 diabetes mellitus with diabetic polyneuropathy: Secondary | ICD-10-CM | POA: Diagnosis not present

## 2016-09-04 DIAGNOSIS — E785 Hyperlipidemia, unspecified: Secondary | ICD-10-CM

## 2016-09-04 DIAGNOSIS — E349 Endocrine disorder, unspecified: Secondary | ICD-10-CM

## 2016-09-04 DIAGNOSIS — Z0289 Encounter for other administrative examinations: Secondary | ICD-10-CM

## 2016-09-04 LAB — BAYER DCA HB A1C WAIVED: HB A1C (BAYER DCA - WAIVED): 8.3 % — ABNORMAL HIGH (ref <7.0)

## 2016-09-04 MED ORDER — HYDROCODONE-ACETAMINOPHEN 5-325 MG PO TABS: 1.0000 | ORAL_TABLET | Freq: Four times a day (QID) | ORAL | 0 refills | Status: DC | PRN

## 2016-09-04 MED ORDER — TESTOSTERONE 20.25 MG/ACT (1.62%) TD GEL: 2.0000 | Freq: Every day | TRANSDERMAL | 3 refills | Status: DC

## 2016-09-04 MED ORDER — CELECOXIB 200 MG PO CAPS: 200.0000 mg | ORAL_CAPSULE | Freq: Two times a day (BID) | ORAL | 3 refills | Status: DC

## 2016-09-04 MED ORDER — GABAPENTIN 600 MG PO TABS: ORAL_TABLET | ORAL | 3 refills | Status: DC

## 2016-09-04 MED ORDER — FOLIC ACID-VIT B6-VIT B12 2.5-25-1 MG PO TABS: 1.0000 | ORAL_TABLET | Freq: Every day | ORAL | 3 refills | Status: DC

## 2016-09-04 MED ORDER — METFORMIN HCL ER (OSM) 1000 MG PO TB24: 1000.0000 mg | ORAL_TABLET | Freq: Two times a day (BID) | ORAL | 3 refills | Status: DC

## 2016-09-04 MED ORDER — DIOVAN 320 MG PO TABS: 320.0000 mg | ORAL_TABLET | Freq: Every day | ORAL | 3 refills | Status: DC

## 2016-09-04 MED ORDER — TRAMADOL HCL 50 MG PO TABS: 100.0000 mg | ORAL_TABLET | Freq: Three times a day (TID) | ORAL | 1 refills | Status: DC

## 2016-09-04 MED ORDER — CANAGLIFLOZIN 300 MG PO TABS: 300.0000 mg | ORAL_TABLET | Freq: Every day | ORAL | 3 refills | Status: DC

## 2016-09-04 NOTE — Progress Notes (Signed)
Subjective:  Patient ID: Alexander Pennington, male    DOB: 1961-02-27  Age: 55 y.o. MRN: [id number]  CC: Annual Exam   HPI SAULO ANTHIS presents for Follow-up of diabetes. Patient does check blood sugar at home. Readings in the mid 100s fasting and postprandial. Patient denies symptoms such as polyuria, polydipsia, excessive hunger, nausea No significant hypoglycemic spells noted. Medications as noted below. Taking them regularly without complication/adverse reaction being reported today.    follow-up of hypertension. Patient has no history of headache chest pain or shortness of breath or recent cough. Patient also denies symptoms of TIA such as numbness weakness lateralizing. Patient checks  blood pressure at home and has not had any elevated readings recently. Patient denies side effects from his medication. States taking it regularly. Patient states that he cannot tolerate various statins. These have been documented over time. He continues to have elevated cholesterol readings.  Patient also continues to have chronic pain. His is based on his diabetic neuropathy. It affects the legs primarily. For this he is dependent on tramadol for pain relief. He takes the maximum allowable dose daily. This is supplemented by hydrocodone. He uses is as a rescue. He generally takes about one a day. Some days he has to use a second pill. However some days he uses none. A bottle of 90 tablets will usually last him 3 months. Pain is also mitigated by Celebrex and gabapentin. It tends to run 4-6/10.   Follow up for testosterone deficiency: Pt. Using medication, AndroGel, as directed. Denies any sx referrable to DVT such as edema or erythema of legs. No dyspnea or chest pain. Energy level reported as being good. Libido is normal and denies E.D. Feels strength is adequate and improved from baseline.    History Atari has a past medical history of CAD (coronary artery disease); Charcot foot due to diabetes mellitus  ([place]); Complication of anesthesia; Diabetes mellitus ([place]); Diabetic Charcot foot ([place]); Family history of anesthesia complication; Hemorrhagic stroke ([place]); Hyperlipidemia; Hypertension; Kidney stones; Murmur, heart (Fall 2014 - diagnosed); Neuropathy ([place]); Obesity; and Sleep apnea.   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   His family history includes Heart attack in his father.He reports that he quit smoking about 28 years ago. His smoking use included Cigarettes. He has never used smokeless tobacco. He reports that he drinks alcohol. He reports that he does not use drugs.    ROS Review of Systems  Constitutional: Negative for activity change, appetite change, chills, diaphoresis, fatigue, fever and unexpected weight change.  HENT: Negative for congestion, ear pain, hearing loss, postnasal drip, rhinorrhea, sore throat, tinnitus and trouble swallowing.   Eyes: Negative for photophobia, pain, discharge and redness.  Respiratory: Negative for apnea, cough, choking, chest tightness, shortness of breath, wheezing and stridor.   Cardiovascular: Negative for chest pain, palpitations and leg swelling.  Gastrointestinal: Negative for abdominal distention, abdominal pain, blood in stool, constipation, diarrhea, nausea and vomiting.  Endocrine: Negative for cold intolerance, heat intolerance, polydipsia, polyphagia and polyuria.  Genitourinary: Negative for difficulty urinating, dysuria, enuresis, flank pain, frequency, genital sores, hematuria and urgency.  Musculoskeletal: Positive for arthralgias, back pain and myalgias. Negative for joint swelling.  Skin: Negative for  color change, rash and wound.  Allergic/Immunologic: Negative for  immunocompromised state.  Neurological: Negative for dizziness, tremors, seizures, syncope, facial asymmetry, speech difficulty, weakness, light-headedness, numbness and headaches.  Hematological: Does not bruise/bleed easily.  Psychiatric/Behavioral: Negative for agitation, behavioral problems, confusion, decreased concentration, dysphoric mood, hallucinations, sleep disturbance and suicidal ideas. The patient is not nervous/anxious and is not hyperactive.     Objective:  BP 131/71   Pulse 74   Temp 97 F (36.1 C) (Oral)   Ht _0  (1.727 m)   Wt 279 lb (126.6 kg)   BMI 42.42 kg/m   BP Readings from Last 3 Encounters:  09/04/16 131/71  05/23/16 140/76  01/31/16 137/82    Wt Readings from Last 3 Encounters:  09/04/16 279 lb (126.6 kg)  05/23/16 277 lb (125.6 kg)  01/31/16 276 lb (125.2 kg)     Physical Exam  Constitutional: He is oriented to person, place, and time. He appears well-developed and well-nourished.  HENT:  Head: Normocephalic and atraumatic.  Mouth/Throat: Oropharynx is clear and moist.  Eyes: EOM are normal. Pupils are equal, round, and reactive to light.  Neck: Normal range of motion. No tracheal deviation present. No thyromegaly present.  Cardiovascular: Normal rate, regular rhythm and normal heart sounds.  Exam reveals no gallop and no friction rub.   No murmur heard. Pulmonary/Chest: Breath sounds normal. He has no wheezes. He has no rales.  Abdominal: Soft. He exhibits no mass. There is no tenderness.  Musculoskeletal: Normal range of motion. He exhibits no edema.  Neurological: He is alert and oriented to person, place, and time.  Skin: Skin is warm and dry.  Psychiatric: He has a normal mood and affect.     Lab Results  Component Value Date   WBC 6.5 01/31/2016   HGB 15.4 12/28/2014   HCT 47.0 01/31/2016   PLT 179 01/31/2016   GLUCOSE 203 (H) 01/31/2016   CHOL 190 01/31/2016     TRIG 156 (H) 01/31/2016   HDL 39 (L) 01/31/2016   LDLCALC 120 (H) 01/31/2016   ALT 16 01/31/2016   AST 14 01/31/2016   NA 137 01/31/2016   K 4.9 01/31/2016   CL 98 01/31/2016   CREATININE 0.75 (L) 01/31/2016   BUN 18 01/31/2016   CO2 22 01/31/2016   INR 1.01 11/04/2014   HGBA1C 7.6 11/01/2015   MICROALBUR negative 12/28/2014    No results found.  Assessment & Plan:   Aldric was seen today for annual exam.  Diagnoses and all orders for this visit:  Well adult exam -     CBC with Differential/Platelet -     CMP14+EGFR -     DG Chest 2 View; Future -     EKG 12-[place] -     PSA Total (Reflex To Free) -     Urinalysis, Complete  Coronary artery disease involving coronary bypass graft of native heart with unstable angina pectoris (HCC) -     EKG 12-Lead -     Urinalysis, Complete  Class 3 obesity due to excess calories with serious comorbidity and body mass index (BMI) of 40.0 to 44.9 in adult (HCC) -     TSH -     Urinalysis, Complete  Heart murmur -     EKG 12-Lead  Obstructive sleep apnea syndrome  Dyslipidemia -     EKG 12-Lead -     TSH  Testosterone deficiency -     EKG 12-Lead -     TSH -     Urinalysis, Complete  Diabetic peripheral neuropathy  associated with type 2 diabetes mellitus (HCC) -     Bayer DCA Hb A1c Waived -     CBC with Differential/Platelet -     CMP14+EGFR -     DG Chest 2 View; Future -     EKG 12-[place] -     PSA Total (Reflex To Free) -     TSH -     Urinalysis, Complete -     Microalbumin / creatinine urine ratio -     Ambulatory referral to Ophthalmology  Uncomplicated opioid dependence ([place]) -     Urinalysis, Complete  Pain medication agreement signed  Other orders -     HYDROcodone-acetaminophen (NORCO) 5-325 MG tablet; Take 1 tablet by mouth every 6 (six) hours as needed. -     Testosterone (ANDROGEL PUMP) 20.25 MG/ACT (1.62%) GEL; Place 2 Applicatorfuls onto the skin daily. -     traMADol (ULTRAM) 50 MG tablet; Take 2  tablets (100 mg total) by mouth 3 (three) times daily. -     celecoxib (CELEBREX) 200 MG capsule; Take 1 capsule (200 mg total) by mouth 2 (two) times daily. -     DIOVAN 320 MG tablet; Take 1 tablet (320 mg total) by mouth daily. -     Folic Acid-Vit O2-UMP N36 ([name]) 2.5-25-1 MG TABS tablet; Take 1 tablet by mouth daily. -     gabapentin (NEURONTIN) 600 MG tablet; TAKE 2 TABLETS THREE TIMES A DAY -     canagliflozin (INVOKANA) 300 MG TABS tablet; Take 1 tablet (300 mg total) by mouth daily. -     metformin ([name]) 1000 MG (OSM) 24 hr tablet; Take 1 tablet (1,000 mg total) by mouth 2 (two) times daily.      I have discontinued Mr. Nawrot mupirocin ointment and silver sulfADIAZINE. I have changed his [name] to Folic Acid-Vit R4-ERX V40 and INVOKANA to canagliflozin. I have also changed his celecoxib, DIOVAN, and metformin. Additionally, I am having him maintain his Multiple Vitamins, Coenzyme Q10, HYDROcodone-acetaminophen, Testosterone, traMADol, and gabapentin.  Meds ordered this encounter  Medications  . HYDROcodone-acetaminophen (NORCO) 5-325 MG tablet    Sig: Take 1 tablet by mouth every 6 (six) hours as needed.    Dispense:  90 tablet    Refill:  0  . Testosterone (ANDROGEL PUMP) 20.25 MG/ACT (1.62%) GEL    Sig: Place 2 Applicatorfuls onto the skin daily.    Dispense:  225 g    Refill:  3  . traMADol (ULTRAM) 50 MG tablet    Sig: Take 2 tablets (100 mg total) by mouth 3 (three) times daily.    Dispense:  540 tablet    Refill:  1  . celecoxib (CELEBREX) 200 MG capsule    Sig: Take 1 capsule (200 mg total) by mouth 2 (two) times daily.    Dispense:  180 capsule    Refill:  3  . DIOVAN 320 MG tablet    Sig: Take 1 tablet (320 mg total) by mouth daily.    Dispense:  90 tablet    Refill:  3  . Folic Acid-Vit G8-QPY P95 ([name]) 2.5-25-1 MG TABS tablet    Sig: Take 1 tablet by mouth daily.    Dispense:  90 tablet    Refill:  3  . gabapentin (NEURONTIN) 600 MG tablet     Sig: TAKE 2 TABLETS THREE TIMES A DAY    Dispense:  540 tablet    Refill:  3  . canagliflozin (INVOKANA) 300 MG  TABS tablet    Sig: Take 1 tablet (300 mg total) by mouth daily.    Dispense:  90 tablet    Refill:  3  . metformin ([name]) 1000 MG (OSM) 24 hr tablet    Sig: Take 1 tablet (1,000 mg total) by mouth 2 (two) times daily.    Dispense:  180 tablet    Refill:  3     Follow-up: Return in about 3 months (around 12/03/2016).  Claretta Fraise, M.D.

## 2016-09-05 LAB — CBC WITH DIFFERENTIAL/PLATELET
BASOS: 1 %
Basophils Absolute: 0.1 10*3/uL (ref 0.0–0.2)
EOS (ABSOLUTE): 0.2 10*3/uL (ref 0.0–0.4)
Eos: 2 %
Hematocrit: 46.6 % (ref 37.5–51.0)
Hemoglobin: 15.3 g/dL (ref 13.0–17.7)
IMMATURE GRANS (ABS): 0 10*3/uL (ref 0.0–0.1)
Immature Granulocytes: 0 %
LYMPHS: 23 %
Lymphocytes Absolute: 1.7 10*3/uL (ref 0.7–3.1)
MCH: 29.8 pg (ref 26.6–33.0)
MCHC: 32.8 g/dL (ref 31.5–35.7)
MCV: 91 fL (ref 79–97)
MONOS ABS: 0.4 10*3/uL (ref 0.1–0.9)
Monocytes: 6 %
NEUTROS ABS: 5.3 10*3/uL (ref 1.4–7.0)
Neutrophils: 68 %
PLATELETS: 195 10*3/uL (ref 150–379)
RBC: 5.13 x10E6/uL (ref 4.14–5.80)
RDW: 13.5 % (ref 12.3–15.4)
WBC: 7.7 10*3/uL (ref 3.4–10.8)

## 2016-09-05 LAB — CMP14+EGFR
A/G RATIO: 1.7 (ref 1.2–2.2)
ALT: 18 IU/L (ref 0–44)
AST: 19 IU/L (ref 0–40)
Albumin: 4.5 g/dL (ref 3.5–5.5)
Alkaline Phosphatase: 84 IU/L (ref 39–117)
BILIRUBIN TOTAL: 0.4 mg/dL (ref 0.0–1.2)
BUN / CREAT RATIO: 19 (ref 9–20)
BUN: 14 mg/dL (ref 6–24)
CHLORIDE: 94 mmol/L — AB (ref 96–106)
CO2: 24 mmol/L (ref 18–29)
Calcium: 10 mg/dL (ref 8.7–10.2)
Creatinine, Ser: 0.75 mg/dL — ABNORMAL LOW (ref 0.76–1.27)
GFR calc non Af Amer: 102 mL/min/{1.73_m2} (ref >59)
GFR, EST AFRICAN AMERICAN: 118 mL/min/{1.73_m2} (ref >59)
Globulin, Total: 2.7 g/dL (ref 1.5–4.5)
Glucose: 243 mg/dL — ABNORMAL HIGH (ref 65–99)
POTASSIUM: 4.9 mmol/L (ref 3.5–5.2)
SODIUM: 137 mmol/L (ref 134–144)
TOTAL PROTEIN: 7.2 g/dL (ref 6.0–8.5)

## 2016-09-05 LAB — VITAMIN B12: Vitamin B-12: 1432 pg/mL — ABNORMAL HIGH (ref 232–1245)

## 2016-09-05 LAB — NMR, LIPOPROFILE
CHOLESTEROL: 193 mg/dL (ref 100–199)
HDL CHOLESTEROL BY NMR: 39 mg/dL — AB (ref >39)
HDL Particle Number: 27.5 umol/L — ABNORMAL LOW (ref >=30.5)
LDL PARTICLE NUMBER: 1663 nmol/L — AB (ref <1000)
LDL Size: 20.3 nm (ref >20.5)
LDL-C: 123 mg/dL — AB (ref 0–99)
LP-IR Score: 73 — ABNORMAL HIGH (ref <=45)
Small LDL Particle Number: 1101 nmol/L — ABNORMAL HIGH (ref <=527)
TRIGLYCERIDES BY NMR: 154 mg/dL — AB (ref 0–149)

## 2016-09-05 LAB — VITAMIN D 25 HYDROXY (VIT D DEFICIENCY, FRACTURES): VIT D 25 HYDROXY: 89.2 ng/mL (ref 30.0–100.0)

## 2016-09-05 LAB — PSA TOTAL (REFLEX TO FREE): Prostate Specific Ag, Serum: 0.5 ng/mL (ref 0.0–4.0)

## 2016-09-05 LAB — MICROALBUMIN / CREATININE URINE RATIO
CREATININE, UR: 22.7 mg/dL
Microalb/Creat Ratio: <13.2 mg/g creat (ref 0.0–30.0)
Microalbumin, Urine: <3 ug/mL

## 2016-09-05 LAB — TSH: TSH: 2.16 u[IU]/mL (ref 0.450–4.500)

## 2016-09-05 LAB — RPR: RPR: NONREACTIVE

## 2016-09-05 LAB — HIV ANTIBODY (ROUTINE TESTING W REFLEX): HIV Screen 4th Generation wRfx: NONREACTIVE

## 2016-09-06 LAB — URINALYSIS, COMPLETE
Bilirubin, UA: NEGATIVE
Ketones, UA: NEGATIVE
Leukocytes, UA: NEGATIVE
NITRITE UA: NEGATIVE
PH UA: 7.5 (ref 5.0–7.5)
Protein, UA: NEGATIVE
RBC, UA: NEGATIVE
Specific Gravity, UA: 1.01 (ref 1.005–1.030)
Urobilinogen, Ur: 0.2 mg/dL (ref 0.2–1.0)

## 2016-09-06 LAB — MICROSCOPIC EXAMINATION
Bacteria, UA: NONE SEEN
RBC, UA: NONE SEEN /hpf (ref 0–?)
RENAL EPITHEL UA: NONE SEEN /HPF
WBC UA: NONE SEEN /HPF (ref 0–?)

## 2016-09-11 LAB — TOXASSURE SELECT 13 (MW), URINE

## 2016-09-18 ENCOUNTER — Other Ambulatory Visit: Payer: Self-pay | Admitting: Family Medicine

## 2016-09-20 ENCOUNTER — Encounter: Payer: Self-pay | Admitting: *Deleted

## 2016-10-10 ENCOUNTER — Encounter: Payer: Self-pay | Admitting: Pharmacist

## 2016-10-11 ENCOUNTER — Telehealth: Payer: Self-pay | Admitting: Pharmacist

## 2016-10-11 MED ORDER — PITAVASTATIN CALCIUM 2 MG PO TABS: 1.0000 | ORAL_TABLET | Freq: Every day | ORAL | 2 refills | Status: DC

## 2016-10-11 NOTE — Telephone Encounter (Signed)
Patient had been referred to me to see about approval of Repatha for treatment of hyperlipidemia in patient with ASCVD and DM.  I called patient to get more information regarding past statin therapies and to document reactions.  Patinet has tried atorvatatin 40mg , rosuvastatin 20mg , pravastatin 20mg  and all caused myalgias after less than a week except pravastatin which he took for about 3 months before having myalgias.   He has also tried Welchol but stopped due to severe constipation and Zetia 10mg  daily which he stopped due to myalgias also.  Patient stated that he had some concerns about Repatha and that he was not sure that he wanted to take it.  I discussed MOA and low side effect profile.    In end patient declined Repatha but agrees to trial of Livalo 2mg  1 tablet daily  Gave 2 weeks of samples and Rx If not able to tolerate he will reconsider Reaptha

## 2016-11-17 ENCOUNTER — Other Ambulatory Visit: Payer: Self-pay | Admitting: Family Medicine

## 2016-11-17 DIAGNOSIS — E1142 Type 2 diabetes mellitus with diabetic polyneuropathy: Secondary | ICD-10-CM

## 2016-11-17 DIAGNOSIS — I25700 Atherosclerosis of coronary artery bypass graft(s), unspecified, with unstable angina pectoris: Secondary | ICD-10-CM

## 2016-11-28 ENCOUNTER — Other Ambulatory Visit: Payer: Self-pay | Admitting: Family Medicine

## 2016-11-28 DIAGNOSIS — E1142 Type 2 diabetes mellitus with diabetic polyneuropathy: Secondary | ICD-10-CM

## 2016-12-17 ENCOUNTER — Other Ambulatory Visit: Payer: Self-pay | Admitting: Family Medicine

## 2016-12-27 ENCOUNTER — Other Ambulatory Visit: Payer: Self-pay | Admitting: Family Medicine

## 2016-12-27 NOTE — Telephone Encounter (Signed)
Overdue for 3 mo f/u needs to be seen

## 2016-12-27 NOTE — Telephone Encounter (Signed)
What is the name of the medication? Androgel  Have you contacted your pharmacy to request a refill? YES  Which pharmacy would you like this sent to? [facility] [address] [place]   Patient notified that their request is being sent to the clinical staff for review and that they should receive a call once it is complete. If they do not receive a call within 24 hours they can check with their pharmacy or our office.

## 2016-12-27 NOTE — Telephone Encounter (Signed)
Appt made to be seen

## 2017-01-02 ENCOUNTER — Ambulatory Visit: Payer: BLUE CROSS/BLUE SHIELD | Admitting: Family Medicine

## 2017-02-16 ENCOUNTER — Other Ambulatory Visit: Payer: Self-pay | Admitting: Family Medicine

## 2017-02-16 DIAGNOSIS — I25700 Atherosclerosis of coronary artery bypass graft(s), unspecified, with unstable angina pectoris: Secondary | ICD-10-CM

## 2017-02-16 DIAGNOSIS — E1142 Type 2 diabetes mellitus with diabetic polyneuropathy: Secondary | ICD-10-CM

## 2017-02-17 ENCOUNTER — Encounter: Payer: Self-pay | Admitting: Family Medicine

## 2017-02-17 ENCOUNTER — Ambulatory Visit (INDEPENDENT_AMBULATORY_CARE_PROVIDER_SITE_OTHER): Payer: BLUE CROSS/BLUE SHIELD | Admitting: Family Medicine

## 2017-02-17 ENCOUNTER — Other Ambulatory Visit: Payer: Self-pay | Admitting: Family Medicine

## 2017-02-17 DIAGNOSIS — F1120 Opioid dependence, uncomplicated: Secondary | ICD-10-CM

## 2017-02-17 DIAGNOSIS — E1142 Type 2 diabetes mellitus with diabetic polyneuropathy: Secondary | ICD-10-CM

## 2017-02-17 DIAGNOSIS — E785 Hyperlipidemia, unspecified: Secondary | ICD-10-CM

## 2017-02-17 DIAGNOSIS — I10 Essential (primary) hypertension: Secondary | ICD-10-CM | POA: Diagnosis not present

## 2017-02-17 DIAGNOSIS — Z0289 Encounter for other administrative examinations: Secondary | ICD-10-CM | POA: Diagnosis not present

## 2017-02-17 DIAGNOSIS — I25700 Atherosclerosis of coronary artery bypass graft(s), unspecified, with unstable angina pectoris: Secondary | ICD-10-CM | POA: Diagnosis not present

## 2017-02-17 DIAGNOSIS — E349 Endocrine disorder, unspecified: Secondary | ICD-10-CM | POA: Diagnosis not present

## 2017-02-17 LAB — BAYER DCA HB A1C WAIVED: HB A1C: 9.3 % — AB (ref <7.0)

## 2017-02-17 MED ORDER — LINAGLIPTIN 5 MG PO TABS: 5.0000 mg | ORAL_TABLET | Freq: Every day | ORAL | 5 refills | Status: DC

## 2017-02-17 MED ORDER — TRAMADOL HCL 50 MG PO TABS: 100.0000 mg | ORAL_TABLET | Freq: Three times a day (TID) | ORAL | 1 refills | Status: DC

## 2017-02-17 MED ORDER — DIOVAN 320 MG PO TABS: 320.0000 mg | ORAL_TABLET | Freq: Every day | ORAL | 1 refills | Status: DC

## 2017-02-17 MED ORDER — TESTOSTERONE 20.25 MG/ACT (1.62%) TD GEL: 2.0000 | Freq: Every day | TRANSDERMAL | 3 refills | Status: DC

## 2017-02-17 MED ORDER — CANAGLIFLOZIN 300 MG PO TABS: 300.0000 mg | ORAL_TABLET | Freq: Every day | ORAL | 1 refills | Status: DC

## 2017-02-17 MED ORDER — GABAPENTIN 600 MG PO TABS: ORAL_TABLET | ORAL | 3 refills | Status: DC

## 2017-02-17 MED ORDER — FOLIC ACID-VIT B6-VIT B12 2.5-25-1 MG PO TABS: 1.0000 | ORAL_TABLET | Freq: Every day | ORAL | 1 refills | Status: DC

## 2017-02-17 MED ORDER — CELECOXIB 200 MG PO CAPS: 200.0000 mg | ORAL_CAPSULE | Freq: Two times a day (BID) | ORAL | 1 refills | Status: DC

## 2017-02-17 MED ORDER — HYDROCODONE-ACETAMINOPHEN 5-325 MG PO TABS: 1.0000 | ORAL_TABLET | Freq: Four times a day (QID) | ORAL | 0 refills | Status: DC | PRN

## 2017-02-17 NOTE — Patient Instructions (Signed)
Monitor blood sugar twice daily. Record on log sheet and bring that back with you for your next visit.

## 2017-02-17 NOTE — Addendum Note (Signed)
Addended by: Margurite AuerbachOMPTON, Teagon Kron G on: 02/17/2017 09:36 AM   Modules accepted: Orders

## 2017-02-17 NOTE — Addendum Note (Signed)
Addended by: Prescott GumLAND, Caasi Giglia M on: 02/17/2017 09:40 AM   Modules accepted: Orders

## 2017-02-17 NOTE — Progress Notes (Signed)
Subjective:  Patient ID: Alexander Pennington,  male    DOB: 09/14/1961  Age: 56 y.o.    CC: Diabetes (pt here today for routine follow up of chronic medical conditions, no other concerns voiced.)   HPI Alexander Pennington presents for  follow-up of hypertension. Patient has no history of headache chest pain or shortness of breath or recent cough. Patient also denies symptoms of TIA such as numbness weakness lateralizing. Patient checks  blood pressure at home. Recent readings have been adequate Patient denies side effects from medication. States taking it regularly.  Patient also  in for follow-up of elevated cholesterol.  He saw the Livalo's generic name is Pitavastatin and thought it was pravastatin so he decided against taking it. However after discussion today he will give it a try. Currently no chest pain, shortness of breath or other cardiovascular related symptoms noted.  Follow-up of diabetes. Patient does not check blood sugar at home.  Patient denies symptoms such as polyuria, polydipsia, excessive hunger, nausea No significant hypoglycemic spells noted. Medications reviewed. Pt reports taking them regularly. Pt. denies complication/adverse reaction today.    History Alexander Pennington has a past medical history of CAD (coronary artery disease); Charcot foot due to diabetes mellitus (HCC); Complication of anesthesia; Diabetes mellitus (HCC); Diabetic Charcot foot (HCC); Family history of anesthesia complication; Hemorrhagic stroke (HCC); Hyperlipidemia; Hypertension; Kidney stones; Murmur, heart (Fall 2014 - diagnosed); Neuropathy; Obesity; and Sleep apnea.   He has a past surgical history that includes lap band surgery; Foot surgery; Carpal tunnel release (Left); I&D extremity (Bilateral, 06/17/2014); Vasectomy; Colonoscopy; Amputation (Left, 07/06/2014); Hardware Removal (Left, 07/06/2014); left heart catheterization with coronary angiogram (N/A, 06/16/2014); Amputation (Left, 09/23/2014); and I&D extremity  (Right, 11/04/2014).   His family history includes Heart attack in his father.He reports that he quit smoking about 28 years ago. His smoking use included Cigarettes. He has never used smokeless tobacco. He reports that he drinks alcohol. He reports that he does not use drugs.  Current Outpatient Prescriptions on File Prior to Visit  Medication Sig Dispense Refill  . celecoxib (CELEBREX) 200 MG capsule TAKE 1 CAPSULE TWICE A DAY 180 capsule 0  . Coenzyme Q10 200 MG capsule Take 1 capsule (200 mg total) by mouth daily.    [name] DIOVAN 320 MG tablet TAKE 1 TABLET DAILY 90 tablet 0  . [name] 2.5-25-1 MG TABS tablet TAKE 1 TABLET DAILY 90 tablet 0  . gabapentin (NEURONTIN) 600 MG tablet TAKE 2 TABLETS THREE TIMES A DAY 540 tablet 3  . INVOKANA 300 MG TABS tablet TAKE 1 TABLET DAILY 90 tablet 0  . metformin ([name]) 1000 MG (OSM) 24 hr tablet TAKE 1 TABLET TWICE A DAY 180 tablet 0  . Multiple Vitamins tablet Take 1 tablet by mouth daily.     No current facility-administered medications on file prior to visit.     ROS Review of Systems  Constitutional: Negative for chills, diaphoresis, fever and unexpected weight change.  HENT: Negative for congestion, hearing loss, rhinorrhea and sore throat.   Eyes: Negative for visual disturbance.  Respiratory: Negative for cough and shortness of breath.   Cardiovascular: Negative for chest pain.  Gastrointestinal: Negative for abdominal pain, constipation and diarrhea.  Genitourinary: Negative for dysuria and flank pain.  Musculoskeletal: Negative for arthralgias and joint swelling.  Skin: Negative for rash.  Neurological: Negative for dizziness and headaches.  Psychiatric/Behavioral: Negative for dysphoric mood and sleep disturbance.    Objective:  BP 136/80   Pulse 77  Temp 97.5 F (36.4 C) (Oral)   Ht 5\' 8"  (1.727 m)   Wt 272 lb (123.4 kg)   BMI 41.36 kg/m   BP Readings from Last 3 Encounters:  02/17/17 136/80  09/04/16 131/71  05/23/16  140/76    Wt Readings from Last 3 Encounters:  02/17/17 272 lb (123.4 kg)  09/04/16 279 lb (126.6 kg)  05/23/16 277 lb (125.6 kg)     Physical Exam  Constitutional: He is oriented to person, place, and time. He appears well-developed and well-nourished. No distress.  HENT:  Head: Normocephalic and atraumatic.  Right Ear: External ear normal.  Left Ear: External ear normal.  Nose: Nose normal.  Mouth/Throat: Oropharynx is clear and moist.  Eyes: Conjunctivae and EOM are normal. Pupils are equal, round, and reactive to light.  Neck: Normal range of motion. Neck supple. No thyromegaly present.  Cardiovascular: Normal rate, regular rhythm and normal heart sounds.   No murmur heard. Pulmonary/Chest: Effort normal and breath sounds normal. No respiratory distress. He has no wheezes. He has no rales.  Abdominal: Soft. Bowel sounds are normal. He exhibits no distension. There is no tenderness.  Lymphadenopathy:    He has no cervical adenopathy.  Neurological: He is alert and oriented to person, place, and time. He has normal reflexes.  Skin: Skin is warm and dry.  Psychiatric: He has a normal mood and affect. His behavior is normal. Judgment and thought content normal.    Diabetic Foot Exam - Simple   Simple Foot Form Diabetic Foot exam was performed with the following findings:  Yes 02/17/2017  9:26 AM  Visual Inspection See comments:  Yes Sensation Testing Intact to touch and monofilament testing bilaterally:  Yes Pulse Check Posterior Tibialis and Dorsalis pulse intact bilaterally:  Yes Comments Patient has active diagnosis of diabetic peripheral neuropathy. Under good control as long as he takes his gabapentin. He has had amputations of the left second, fourth and fifth toes due to diabetic complications.       Assessment & Plan:   Alexander Pennington was seen today for diabetes.  Diagnoses and all orders for this visit:  Diabetic peripheral neuropathy associated with type 2 diabetes  mellitus (HCC) -     traMADol (ULTRAM) 50 MG tablet; Take 2 tablets (100 mg total) by mouth 3 (three) times daily. -     HYDROcodone-acetaminophen (NORCO) 5-325 MG tablet; Take 1 tablet by mouth every 6 (six) hours as needed.  Uncomplicated opioid dependence (HCC) -     HYDROcodone-acetaminophen (NORCO) 5-325 MG tablet; Take 1 tablet by mouth every 6 (six) hours as needed.  Pain medication agreement signed -     HYDROcodone-acetaminophen (NORCO) 5-325 MG tablet; Take 1 tablet by mouth every 6 (six) hours as needed.  Testosterone deficiency -     Testosterone (ANDROGEL PUMP) 20.25 MG/ACT (1.62%) GEL; Place 2 Applicatorfuls onto the skin daily.   I have discontinued Alexander Pennington's Pitavastatin Calcium. I am also having him maintain his Multiple Vitamins, Coenzyme Q10, gabapentin, [name], INVOKANA, DIOVAN, celecoxib, metformin, traMADol, HYDROcodone-acetaminophen, and Testosterone.  Meds ordered this encounter  Medications  . traMADol (ULTRAM) 50 MG tablet    Sig: Take 2 tablets (100 mg total) by mouth 3 (three) times daily.    Dispense:  540 tablet    Refill:  1  . HYDROcodone-acetaminophen (NORCO) 5-325 MG tablet    Sig: Take 1 tablet by mouth every 6 (six) hours as needed.    Dispense:  90 tablet    Refill:  0  . Testosterone (ANDROGEL PUMP) 20.25 MG/ACT (1.62%) GEL    Sig: Place 2 Applicatorfuls onto the skin daily.    Dispense:  225 g    Refill:  3   Monitor glucoses twice daily  Follow-up: Return in about 3 months (around 05/20/2017).  Mechele Claude, M.D.

## 2017-02-18 LAB — CMP14+EGFR
ALK PHOS: 71 IU/L (ref 39–117)
ALT: 21 IU/L (ref 0–44)
AST: 17 IU/L (ref 0–40)
Albumin/Globulin Ratio: 1.9 (ref 1.2–2.2)
Albumin: 4.5 g/dL (ref 3.5–5.5)
BUN/Creatinine Ratio: 28 — ABNORMAL HIGH (ref 9–20)
BUN: 19 mg/dL (ref 6–24)
Bilirubin Total: 0.6 mg/dL (ref 0.0–1.2)
CO2: 23 mmol/L (ref 18–29)
CREATININE: 0.68 mg/dL — AB (ref 0.76–1.27)
Calcium: 9.4 mg/dL (ref 8.7–10.2)
Chloride: 98 mmol/L (ref 96–106)
GFR calc Af Amer: 123 mL/min/{1.73_m2} (ref >59)
GFR calc non Af Amer: 106 mL/min/{1.73_m2} (ref >59)
GLUCOSE: 255 mg/dL — AB (ref 65–99)
Globulin, Total: 2.4 g/dL (ref 1.5–4.5)
Potassium: 4.7 mmol/L (ref 3.5–5.2)
Sodium: 136 mmol/L (ref 134–144)
Total Protein: 6.9 g/dL (ref 6.0–8.5)

## 2017-02-18 LAB — LIPID PANEL
CHOLESTEROL TOTAL: 176 mg/dL (ref 100–199)
Chol/HDL Ratio: 4.8 ratio (ref 0.0–5.0)
HDL: 37 mg/dL — ABNORMAL LOW (ref >39)
LDL CALC: 107 mg/dL — AB (ref 0–99)
TRIGLYCERIDES: 162 mg/dL — AB (ref 0–149)
VLDL CHOLESTEROL CAL: 32 mg/dL (ref 5–40)

## 2017-02-18 LAB — CBC WITH DIFFERENTIAL/PLATELET
BASOS ABS: 0 10*3/uL (ref 0.0–0.2)
Basos: 1 %
EOS (ABSOLUTE): 0.2 10*3/uL (ref 0.0–0.4)
Eos: 2 %
Hematocrit: 46.9 % (ref 37.5–51.0)
Hemoglobin: 15.7 g/dL (ref 13.0–17.7)
Immature Grans (Abs): 0 10*3/uL (ref 0.0–0.1)
Immature Granulocytes: 0 %
LYMPHS ABS: 1.4 10*3/uL (ref 0.7–3.1)
Lymphs: 20 %
MCH: 30.7 pg (ref 26.6–33.0)
MCHC: 33.5 g/dL (ref 31.5–35.7)
MCV: 92 fL (ref 79–97)
MONOCYTES: 6 %
MONOS ABS: 0.4 10*3/uL (ref 0.1–0.9)
NEUTROS PCT: 71 %
Neutrophils Absolute: 5 10*3/uL (ref 1.4–7.0)
Platelets: 178 10*3/uL (ref 150–379)
RBC: 5.12 x10E6/uL (ref 4.14–5.80)
RDW: 13.5 % (ref 12.3–15.4)
WBC: 7.1 10*3/uL (ref 3.4–10.8)

## 2017-02-18 LAB — TESTOSTERONE,FREE AND TOTAL
TESTOSTERONE FREE: 5.2 pg/mL — AB (ref 7.2–24.0)
TESTOSTERONE: 289 ng/dL (ref 264–916)

## 2017-02-20 ENCOUNTER — Encounter: Payer: Self-pay | Admitting: *Deleted

## 2017-02-20 ENCOUNTER — Telehealth: Payer: Self-pay | Admitting: Family Medicine

## 2017-02-20 NOTE — Telephone Encounter (Signed)
Results letter printed and mailed to pt as requested.

## 2017-03-17 ENCOUNTER — Other Ambulatory Visit: Payer: Self-pay | Admitting: Family Medicine

## 2017-03-26 ENCOUNTER — Telehealth: Payer: Self-pay | Admitting: Family Medicine

## 2017-03-26 ENCOUNTER — Other Ambulatory Visit: Payer: Self-pay | Admitting: Family Medicine

## 2017-03-26 DIAGNOSIS — E349 Endocrine disorder, unspecified: Secondary | ICD-10-CM

## 2017-03-26 NOTE — Telephone Encounter (Signed)
Pt may have written Rx from 6/4 visit at home. Will ask for a month refill to be sent to local pharmacy so that he may mail other Rx to mail order pharmacy

## 2017-03-28 NOTE — Telephone Encounter (Signed)
Was filled 7/12

## 2017-05-20 ENCOUNTER — Encounter (INDEPENDENT_AMBULATORY_CARE_PROVIDER_SITE_OTHER): Payer: Self-pay

## 2017-05-20 ENCOUNTER — Ambulatory Visit (INDEPENDENT_AMBULATORY_CARE_PROVIDER_SITE_OTHER): Payer: BLUE CROSS/BLUE SHIELD | Admitting: Family Medicine

## 2017-05-20 ENCOUNTER — Encounter: Payer: Self-pay | Admitting: Family Medicine

## 2017-05-20 VITALS — BP 139/77 | HR 81 | Temp 97.2°F | Ht 68.0 in | Wt 269.0 lb

## 2017-05-20 DIAGNOSIS — F1120 Opioid dependence, uncomplicated: Secondary | ICD-10-CM | POA: Diagnosis not present

## 2017-05-20 DIAGNOSIS — E1142 Type 2 diabetes mellitus with diabetic polyneuropathy: Secondary | ICD-10-CM

## 2017-05-20 DIAGNOSIS — E1121 Type 2 diabetes mellitus with diabetic nephropathy: Secondary | ICD-10-CM | POA: Diagnosis not present

## 2017-05-20 DIAGNOSIS — E349 Endocrine disorder, unspecified: Secondary | ICD-10-CM | POA: Diagnosis not present

## 2017-05-20 DIAGNOSIS — I10 Essential (primary) hypertension: Secondary | ICD-10-CM | POA: Diagnosis not present

## 2017-05-20 DIAGNOSIS — E785 Hyperlipidemia, unspecified: Secondary | ICD-10-CM

## 2017-05-20 DIAGNOSIS — I25700 Atherosclerosis of coronary artery bypass graft(s), unspecified, with unstable angina pectoris: Secondary | ICD-10-CM | POA: Diagnosis not present

## 2017-05-20 DIAGNOSIS — Z6841 Body Mass Index (BMI) 40.0 and over, adult: Secondary | ICD-10-CM | POA: Diagnosis not present

## 2017-05-20 LAB — BAYER DCA HB A1C WAIVED: HB A1C (BAYER DCA - WAIVED): 9.1 % — ABNORMAL HIGH (ref <7.0)

## 2017-05-20 MED ORDER — TRAMADOL HCL 50 MG PO TABS: 100.0000 mg | ORAL_TABLET | Freq: Three times a day (TID) | ORAL | 1 refills | Status: DC

## 2017-05-20 MED ORDER — TESTOSTERONE 20.25 MG/ACT (1.62%) TD GEL: 2.0000 | Freq: Every day | TRANSDERMAL | 0 refills | Status: DC

## 2017-05-20 MED ORDER — "INSULIN SYRINGE-NEEDLE U-100 28G X 5/16"" 0.5 ML MISC": 1 refills | Status: DC

## 2017-05-20 MED ORDER — INSULIN ASPART 100 UNIT/ML ~~LOC~~ SOLN
5.0000 [IU] | Freq: Three times a day (TID) | SUBCUTANEOUS | 3 refills | Status: DC
Start: 2017-05-20 — End: 2017-12-16

## 2017-05-20 MED ORDER — TESTOSTERONE 20.25 MG/ACT (1.62%) TD GEL: 2.0000 | Freq: Every day | TRANSDERMAL | 3 refills | Status: DC

## 2017-05-20 MED ORDER — TRAMADOL HCL 50 MG PO TABS: 100.0000 mg | ORAL_TABLET | Freq: Three times a day (TID) | ORAL | 0 refills | Status: DC

## 2017-05-20 MED ORDER — DIOVAN 320 MG PO TABS: 320.0000 mg | ORAL_TABLET | Freq: Every day | ORAL | 1 refills | Status: DC

## 2017-05-20 NOTE — Progress Notes (Signed)
Subjective:  Patient ID: Alexander Pennington,  male    DOB: [birth year]  Age: 56 y.o.    CC: Diabetes (pt here today for routine follow up of his chronic medical conditions and medication refills)   HPI JERMARCUS Pennington presents for  follow-up of hypertension. Patient has no history of headache chest pain or shortness of breath or recent cough. Patient also denies symptoms of TIA such as numbness weakness lateralizing.Patient denies side effects from medication. States taking it regularly.  Patient also  in for follow-up of elevated cholesterol. Doing well without complaints on current medication. Denies side effects of statin including myalgia and arthralgia and nausea. Also in today for liver function testing. Currently no chest pain, shortness of breath or other cardiovascular related symptoms noted.  Follow-up of diabetes. Patient does check blood sugar at home. Readings run over 200Patient denies  symptoms such as polyuria, polydipsia, excessive hunger, nausea. Leg pain in 6-7 range from neuropathy. Taking tramadol regularly, vicodin on average under 1 a day, but occasionally 2 in a day. [place] reviewed.No irregularity noted.  No significant hypoglycemic spells noted. Medications reviewed. Pt reports taking them regularly. Pt. denies complication/adverse reaction today.    History Alexander Pennington has a past medical history of CAD (coronary artery disease); Charcot foot due to diabetes mellitus ([place]); Complication of anesthesia; Diabetes mellitus ([place]); Diabetic Charcot foot ([place]); Family history of anesthesia complication; Hemorrhagic stroke ([place]); Hyperlipidemia; Hypertension; Kidney stones; Murmur, heart (Fall 2014 - diagnosed); Neuropathy; Obesity; and Sleep apnea.   He has a past surgical history that includes lap band surgery; Foot surgery; Carpal tunnel release (Left); I&D extremity (Bilateral, 06/17/2014); Vasectomy; Colonoscopy; Amputation (Left, 07/06/2014); Hardware Removal (Left, 07/06/2014); left  heart catheterization with coronary angiogram (N/A, 06/16/2014); Amputation (Left, 09/23/2014); and I&D extremity (Right, 11/04/2014).   His family history includes Heart attack in his father.He reports that he quit smoking about 28 years ago. His smoking use included Cigarettes. He has never used smokeless tobacco. He reports that he drinks alcohol. He reports that he does not use drugs.  Current Outpatient Prescriptions on File Prior to Visit  Medication Sig Dispense Refill  . canagliflozin (INVOKANA) 300 MG TABS tablet Take 1 tablet (300 mg total) by mouth daily. 90 tablet 1  . celecoxib (CELEBREX) 200 MG capsule Take 1 capsule (200 mg total) by mouth 2 (two) times daily. 180 capsule 1  . Coenzyme Q10 200 MG capsule Take 1 capsule (200 mg total) by mouth daily.    . Folic Acid-Vit D5-HGD J24 ([name]) 2.5-25-1 MG TABS tablet Take 1 tablet by mouth daily. 90 tablet 1  . gabapentin (NEURONTIN) 600 MG tablet TAKE 2 TABLETS THREE TIMES A DAY 540 tablet 3  . HYDROcodone-acetaminophen (NORCO) 5-325 MG tablet Take 1 tablet by mouth every 6 (six) hours as needed. 90 tablet 0  . linagliptin (TRADJENTA) 5 MG TABS tablet Take 1 tablet (5 mg total) by mouth daily. For diabetes 30 tablet 5  . metformin ([name]) 1000 MG (OSM) 24 hr tablet TAKE 1 TABLET TWICE A DAY 180 tablet 0  . Multiple Vitamins tablet Take 1 tablet by mouth daily.     No current facility-administered medications on file prior to visit.     ROS Review of Systems  Constitutional: Negative for chills, diaphoresis, fever and unexpected weight change.  HENT: Negative for congestion, hearing loss, rhinorrhea and sore throat.   Eyes: Negative for visual disturbance.  Respiratory: Negative for cough and shortness of breath.   Cardiovascular: Negative  for chest pain.  Gastrointestinal: Negative for abdominal pain, constipation and diarrhea.  Genitourinary: Negative for dysuria and flank pain.  Musculoskeletal: Negative for arthralgias and  joint swelling.  Skin: Negative for rash.  Neurological: Negative for dizziness and headaches.  Psychiatric/Behavioral: Negative for dysphoric mood and sleep disturbance.    Objective:  BP 139/77   Pulse 81   Temp (!) 97.2 F (36.2 C) (Oral)   Ht _0  (1.727 m)   Wt 269 lb (122 kg)   BMI 40.90 kg/m   BP Readings from Last 3 Encounters:  05/20/17 139/77  02/17/17 136/80  09/04/16 131/71    Wt Readings from Last 3 Encounters:  05/20/17 269 lb (122 kg)  02/17/17 272 lb (123.4 kg)  09/04/16 279 lb (126.6 kg)     Physical Exam  Constitutional: He is oriented to person, place, and time. He appears well-developed and well-nourished. No distress.  HENT:  Head: Normocephalic and atraumatic.  Right Ear: External ear normal.  Left Ear: External ear normal.  Nose: Nose normal.  Mouth/Throat: Oropharynx is clear and moist.  Eyes: Pupils are equal, round, and reactive to light. Conjunctivae and EOM are normal.  Neck: Normal range of motion. Neck supple. No thyromegaly present.  Cardiovascular: Normal rate, regular rhythm and normal heart sounds.   No murmur heard. Pulmonary/Chest: Effort normal and breath sounds normal. No respiratory distress. He has no wheezes. He has no rales.  Abdominal: Soft. Bowel sounds are normal. He exhibits no distension. There is no tenderness.  Lymphadenopathy:    He has no cervical adenopathy.  Neurological: He is alert and oriented to person, place, and time. He has normal reflexes.  Skin: Skin is warm and dry.  Psychiatric: He has a normal mood and affect. His behavior is normal. Judgment and thought content normal.    Diabetic Foot Exam - Simple   No data filed        Assessment & Plan:   Neo was seen today for diabetes.  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 -     Testosterone (ANDROGEL PUMP) 20.25 MG/ACT (1.62%) GEL; Place 2 Applicatorfuls onto the skin daily.  Diabetic peripheral neuropathy associated with type 2 diabetes mellitus (HCC) -     DIOVAN 320 MG tablet; Take 1 tablet (320 mg total) by mouth daily. -     Discontinue: traMADol (ULTRAM) 50 MG tablet; Take 2 tablets (100 mg total) by mouth 3 (three) times daily. -     traMADol (ULTRAM) 50 MG tablet; Take 2 tablets (100 mg total) by mouth 3 (three) times daily.  Uncomplicated opioid dependence ([place]) -     ToxASSURE Select 13 (MW), Urine  Other orders -     insulin aspart (NOVOLOG) 100 UNIT/ML injection; Inject 5 Units into the skin 3 (three) times daily before meals. -  Insulin Syringe-Needle U-100 28G X 5/16" 0.5 ML MISC; Use to inject % units Novolog TID SubQ   I am having Mr. Arana start on insulin aspart and Insulin Syringe-Needle U-100. I am also having him maintain his Multiple Vitamins, Coenzyme Q10, HYDROcodone-acetaminophen, Folic Acid-Vit F3-VOU Z14, gabapentin, canagliflozin, celecoxib, linagliptin, metformin, DIOVAN, Testosterone, Testosterone, and traMADol.  Meds ordered this encounter  Medications  . insulin aspart (NOVOLOG) 100 UNIT/ML injection    Sig: Inject 5 Units into the skin 3 (three) times daily before meals.    Dispense:  3 vial    Refill:  3  . DIOVAN 320 MG tablet    Sig: Take 1 tablet (320 mg total) by mouth daily.    Dispense:  90 tablet    Refill:  1  . DISCONTD: traMADol (ULTRAM) 50 MG tablet    Sig: Take 2 tablets (100  mg total) by mouth 3 (three) times daily.    Dispense:  540 tablet    Refill:  1  . Testosterone (ANDROGEL PUMP) 20.25 MG/ACT (1.62%) GEL    Sig: Place 2 Applicatorfuls onto the skin daily.    Dispense:  225 g    Refill:  3  . Testosterone (ANDROGEL PUMP) 20.25 MG/ACT (1.62%) GEL    Sig: Place 2 Applicatorfuls onto the skin daily.    Dispense:  75 g    Refill:  0  . traMADol (ULTRAM) 50 MG tablet    Sig: Take 2 tablets (100 mg total) by mouth 3 (three) times daily.    Dispense:  180 tablet    Refill:  0  . Insulin Syringe-Needle U-100 28G X 5/16" 0.5 ML MISC    Sig: Use to inject % units Novolog TID SubQ    Dispense:  270 each    Refill:  1     Follow-up: Return in about 3 months (around 08/19/2017).  Claretta Fraise, M.D.

## 2017-05-21 LAB — CMP14+EGFR
A/G RATIO: 1.8 (ref 1.2–2.2)
ALT: 22 IU/L (ref 0–44)
AST: 23 IU/L (ref 0–40)
Albumin: 4.8 g/dL (ref 3.5–5.5)
Alkaline Phosphatase: 79 IU/L (ref 39–117)
BILIRUBIN TOTAL: 0.8 mg/dL (ref 0.0–1.2)
BUN/Creatinine Ratio: 27 — ABNORMAL HIGH (ref 9–20)
BUN: 21 mg/dL (ref 6–24)
CHLORIDE: 97 mmol/L (ref 96–106)
CO2: 22 mmol/L (ref 20–29)
Calcium: 9.8 mg/dL (ref 8.7–10.2)
Creatinine, Ser: 0.79 mg/dL (ref 0.76–1.27)
GFR calc non Af Amer: 99 mL/min/{1.73_m2} (ref >59)
GFR, EST AFRICAN AMERICAN: 114 mL/min/{1.73_m2} (ref >59)
GLOBULIN, TOTAL: 2.6 g/dL (ref 1.5–4.5)
Glucose: 248 mg/dL — ABNORMAL HIGH (ref 65–99)
POTASSIUM: 4.7 mmol/L (ref 3.5–5.2)
SODIUM: 137 mmol/L (ref 134–144)
TOTAL PROTEIN: 7.4 g/dL (ref 6.0–8.5)

## 2017-05-21 LAB — TESTOSTERONE,FREE AND TOTAL
Testosterone, Free: 6.6 pg/mL — ABNORMAL LOW (ref 7.2–24.0)
Testosterone: 228 ng/dL — ABNORMAL LOW (ref 264–916)

## 2017-05-21 LAB — MICROALBUMIN / CREATININE URINE RATIO
Creatinine, Urine: 20.1 mg/dL
Microalbumin, Urine: <3 ug/mL

## 2017-05-21 MED ORDER — DIOVAN 320 MG PO TABS: 320.0000 mg | ORAL_TABLET | Freq: Every day | ORAL | 1 refills | Status: DC

## 2017-05-21 MED ORDER — VALSARTAN 320 MG PO TABS: 320.0000 mg | ORAL_TABLET | Freq: Every day | ORAL | 1 refills | Status: DC

## 2017-05-21 NOTE — Addendum Note (Signed)
Addended by: Julious PayerHOLT, CATHERINE D on: 05/21/2017 04:49 PM   Modules accepted: Orders

## 2017-05-21 NOTE — Addendum Note (Signed)
Addended by: Julious PayerHOLT, Ladasha Schnackenberg D on: 05/21/2017 11:07 AM   Modules accepted: Orders

## 2017-05-23 LAB — TOXASSURE SELECT 13 (MW), URINE

## 2017-05-26 MED ORDER — VALSARTAN 320 MG PO TABS: 320.0000 mg | ORAL_TABLET | Freq: Every day | ORAL | 1 refills | Status: DC

## 2017-05-26 NOTE — Addendum Note (Signed)
Addended by: Julious PayerHOLT, Denisa Enterline D on: 05/26/2017 10:09 AM   Modules accepted: Orders

## 2017-05-27 MED ORDER — VALSARTAN 320 MG PO TABS: 320.0000 mg | ORAL_TABLET | Freq: Every day | ORAL | 1 refills | Status: DC

## 2017-05-27 NOTE — Addendum Note (Signed)
Addended by: Julious PayerHOLT, CATHERINE D on: 05/27/2017 03:47 PM   Modules accepted: Orders

## 2017-05-28 ENCOUNTER — Telehealth: Payer: Self-pay

## 2017-05-28 NOTE — Telephone Encounter (Signed)
[facility] and insurance will not pay for the Novolog. Will pay for Humalog- per Dr. Darlyn ReadStacks it is okay to switch patient to Humalog.

## 2017-06-02 MED ORDER — VALSARTAN 320 MG PO TABS: 320.0000 mg | ORAL_TABLET | Freq: Every day | ORAL | 1 refills | Status: DC

## 2017-06-02 NOTE — Addendum Note (Signed)
Addended by: Julious Payer D on: 06/02/2017 09:50 AM   Modules accepted: Orders

## 2017-06-02 NOTE — Progress Notes (Signed)
Rx for Diovan called to [facility] it has failed to go electronically several times

## 2017-06-02 NOTE — Addendum Note (Signed)
Addended by: Julious Payer D on: 06/02/2017 10:00 AM   Modules accepted: Orders

## 2017-06-05 ENCOUNTER — Telehealth: Payer: Self-pay | Admitting: *Deleted

## 2017-06-05 MED ORDER — OLMESARTAN MEDOXOMIL 40 MG PO TABS: 40.0000 mg | ORAL_TABLET | Freq: Every day | ORAL | 1 refills | Status: DC

## 2017-06-05 NOTE — Telephone Encounter (Signed)
Olmesartan sent in to replace the Valsartan per [facility].

## 2017-06-10 ENCOUNTER — Telehealth: Payer: Self-pay | Admitting: Family Medicine

## 2017-06-12 ENCOUNTER — Encounter: Payer: Self-pay | Admitting: *Deleted

## 2017-06-12 NOTE — Telephone Encounter (Signed)
Pt notified that Benicar replaced Diovan due to recall Verbalizes understanding

## 2017-06-15 ENCOUNTER — Other Ambulatory Visit: Payer: Self-pay | Admitting: Family Medicine

## 2017-06-23 ENCOUNTER — Telehealth: Payer: Self-pay | Admitting: *Deleted

## 2017-06-23 DIAGNOSIS — E349 Endocrine disorder, unspecified: Secondary | ICD-10-CM

## 2017-06-23 MED ORDER — TESTOSTERONE 20.25 MG/ACT (1.62%) TD GEL: 4.0000 | Freq: Every day | TRANSDERMAL | 3 refills | Status: DC

## 2017-06-23 NOTE — Telephone Encounter (Signed)
Increased dosage from lab results not called into [facility] Will print new Rx to fax to [facility]

## 2017-07-08 ENCOUNTER — Telehealth: Payer: Self-pay | Admitting: Family Medicine

## 2017-07-08 ENCOUNTER — Other Ambulatory Visit: Payer: Self-pay | Admitting: Family Medicine

## 2017-07-08 DIAGNOSIS — E349 Endocrine disorder, unspecified: Secondary | ICD-10-CM

## 2017-07-08 MED ORDER — TESTOSTERONE 20.25 MG/ACT (1.62%) TD GEL: 4.0000 | Freq: Every day | TRANSDERMAL | 1 refills | Status: DC

## 2017-07-08 NOTE — Telephone Encounter (Signed)
Please increase the dispensed amount to 450 g. He can have one refill.  Thanks, WS

## 2017-07-08 NOTE — Telephone Encounter (Signed)
Rx printed and signed and faxed to [facility]. Left detailed message for pt stating requested rx had been sent to the pharmacy and to call back with any other concerns or questions.

## 2017-07-08 NOTE — Telephone Encounter (Signed)
Will you review and see if correct dose and quantity was sent in?

## 2017-08-16 ENCOUNTER — Other Ambulatory Visit: Payer: Self-pay | Admitting: Family Medicine

## 2017-08-16 DIAGNOSIS — E1142 Type 2 diabetes mellitus with diabetic polyneuropathy: Secondary | ICD-10-CM

## 2017-08-21 ENCOUNTER — Other Ambulatory Visit: Payer: Self-pay | Admitting: Family Medicine

## 2017-08-22 ENCOUNTER — Ambulatory Visit: Payer: BLUE CROSS/BLUE SHIELD | Admitting: Family Medicine

## 2017-08-26 ENCOUNTER — Ambulatory Visit: Payer: BLUE CROSS/BLUE SHIELD | Admitting: Family Medicine

## 2017-08-28 ENCOUNTER — Telehealth: Payer: Self-pay | Admitting: Family Medicine

## 2017-08-28 NOTE — Telephone Encounter (Signed)
appt scheduled Pt notified 

## 2017-09-13 ENCOUNTER — Other Ambulatory Visit: Payer: Self-pay | Admitting: Family Medicine

## 2017-09-17 ENCOUNTER — Encounter: Payer: Self-pay | Admitting: Family Medicine

## 2017-09-17 ENCOUNTER — Ambulatory Visit (INDEPENDENT_AMBULATORY_CARE_PROVIDER_SITE_OTHER): Payer: BLUE CROSS/BLUE SHIELD | Admitting: Family Medicine

## 2017-09-17 VITALS — BP 166/90 | HR 111 | Temp 98.4°F | Ht 68.0 in | Wt 263.0 lb

## 2017-09-17 DIAGNOSIS — E1121 Type 2 diabetes mellitus with diabetic nephropathy: Secondary | ICD-10-CM

## 2017-09-17 DIAGNOSIS — I10 Essential (primary) hypertension: Secondary | ICD-10-CM | POA: Diagnosis not present

## 2017-09-17 DIAGNOSIS — E349 Endocrine disorder, unspecified: Secondary | ICD-10-CM | POA: Diagnosis not present

## 2017-09-17 DIAGNOSIS — F1120 Opioid dependence, uncomplicated: Secondary | ICD-10-CM | POA: Diagnosis not present

## 2017-09-17 DIAGNOSIS — E1142 Type 2 diabetes mellitus with diabetic polyneuropathy: Secondary | ICD-10-CM

## 2017-09-17 DIAGNOSIS — Z0289 Encounter for other administrative examinations: Secondary | ICD-10-CM

## 2017-09-17 DIAGNOSIS — E785 Hyperlipidemia, unspecified: Secondary | ICD-10-CM

## 2017-09-17 LAB — BAYER DCA HB A1C WAIVED: HB A1C (BAYER DCA - WAIVED): 7.5 % — ABNORMAL HIGH (ref <7.0)

## 2017-09-17 MED ORDER — LORAZEPAM 1 MG PO TABS: 1.0000 mg | ORAL_TABLET | Freq: Every day | ORAL | 0 refills | Status: DC

## 2017-09-17 MED ORDER — TESTOSTERONE 20.25 MG/ACT (1.62%) TD GEL
4.0000 | Freq: Every day | TRANSDERMAL | 1 refills | Status: DC
Start: 2017-09-17 — End: 2017-12-16

## 2017-09-17 MED ORDER — HYDROCODONE-ACETAMINOPHEN 5-325 MG PO TABS: 1.0000 | ORAL_TABLET | Freq: Four times a day (QID) | ORAL | 0 refills | Status: DC | PRN

## 2017-09-17 NOTE — Progress Notes (Signed)
Subjective:  Patient ID: Alexander Pennington,  male    DOB: [birth year]  Age: 57 y.o.    CC: Diabetes (pt here today for routine follow up of his chronic medical conditions )   HPI GEROD CALIGIURI presents for  follow-up of hypertension. Patient has no history of headache chest pain or shortness of breath or recent cough. Patient also denies symptoms of TIA such as numbness weakness lateralizing. Patient checks  blood pressure at home. Recent readings have been good so he decided to DC his medication. Patient also  in for follow-up of elevated cholesterol. Pt. Is aware of his increased risk. Declines statins. Currently no chest pain, shortness of breath or other cardiovascular related symptoms noted.  Follow-up of diabetes. Patient does check blood sugar at home. Readings run between 170 and 180 Patient denies symptoms such as polyuria, polydipsia, excessive hunger, nausea No significant hypoglycemic spells noted. Medications reviewed. Pt reports taking them regularly. Pt. denies complication/adverse reaction today.    History Carrington has a past medical history of CAD (coronary artery disease), Charcot foot due to diabetes mellitus ([place]), Complication of anesthesia, Diabetes mellitus ([place]), Diabetic Charcot foot ([place]), Family history of anesthesia complication, Hemorrhagic stroke ([place]), Hyperlipidemia, Hypertension, Kidney stones, Murmur, heart (Fall 2014 - diagnosed), Neuropathy, Obesity, and Sleep apnea.   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   His family history includes Heart attack in his father.He reports that he quit smoking about 29 years ago. His smoking use  included cigarettes. he has never used smokeless tobacco. He reports that he drinks alcohol. He reports that he does not use drugs.  Current Outpatient Medications on File Prior to Visit  Medication Sig Dispense Refill  . celecoxib (CELEBREX) 200 MG capsule TAKE 1 CAPSULE TWICE A DAY 180 capsule 0  . Coenzyme Q10 200 MG capsule Take 1 capsule (200 mg total) by mouth daily.    [name] gabapentin (NEURONTIN) 600 MG tablet TAKE 2 TABLETS THREE TIMES A DAY 540 tablet 3  . insulin aspart (NOVOLOG) 100 UNIT/ML injection Inject 5 Units into the skin 3 (three) times daily before meals. 3 vial 3  . Insulin Syringe-Needle U-100 28G X 5/16" 0.5 ML MISC Use to inject % units Novolog TID SubQ 270 each 1  . INVOKANA 300 MG TABS tablet TAKE 1 TABLET DAILY 90 tablet 0  . metformin ([name]) 1000 MG (OSM) 24 hr tablet TAKE 1 TABLET TWICE A DAY 180 tablet 0  . Multiple Vitamins tablet Take 1 tablet by mouth daily.    . traMADol (ULTRAM) 50 MG tablet Take 2 tablets (100 mg total) by mouth 3 (three) times daily. 180 tablet 0  . Folic Acid-Vit L2-GMW N02 ([name]) 2.5-25-1 MG TABS tablet Take 1 tablet by mouth daily. (Patient not taking: Reported on 09/17/2017) 90 tablet 1  . linagliptin (TRADJENTA) 5 MG TABS tablet Take 1 tablet (5 mg total) by mouth daily. For diabetes (Patient not taking: Reported on 09/17/2017) 30 tablet 5  . olmesartan (BENICAR) 40 MG tablet Take 1 tablet (40 mg total) by mouth daily. (Patient not taking: Reported on 09/17/2017) 90 tablet 1   No current facility-administered medications on file prior to visit.     ROS Review of Systems  Constitutional: Negative for chills, diaphoresis,  fever and unexpected weight change.  HENT: Negative for congestion, hearing loss, rhinorrhea and sore throat.   Eyes: Negative for visual disturbance.  Respiratory: Negative for cough and shortness of breath.   Cardiovascular: Negative for chest pain.  Gastrointestinal: Negative for abdominal pain, constipation and  diarrhea.  Genitourinary: Negative for dysuria and flank pain.  Musculoskeletal: Negative for arthralgias and joint swelling.  Skin: Negative for rash.  Neurological: Negative for dizziness and headaches.  Psychiatric/Behavioral: Positive for sleep disturbance (occasional due to stress. Relates that his father is in failing health. Was in E.D. all night with him last night.). Negative for dysphoric mood.    Objective:  BP (!) 166/90   Pulse (!) 111   Temp 98.4 F (36.9 C) (Oral)   Ht 5' 8" (1.727 m)   Wt 263 lb (119.3 kg)   BMI 39.99 kg/m   BP Readings from Last 3 Encounters:  09/17/17 (!) 166/90  05/20/17 139/77  02/17/17 136/80    Wt Readings from Last 3 Encounters:  09/17/17 263 lb (119.3 kg)  05/20/17 269 lb (122 kg)  02/17/17 272 lb (123.4 kg)     Physical Exam  Constitutional: He is oriented to person, place, and time. He appears well-developed and well-nourished. No distress.  HENT:  Head: Normocephalic and atraumatic.  Right Ear: External ear normal.  Left Ear: External ear normal.  Nose: Nose normal.  Mouth/Throat: Oropharynx is clear and moist.  Eyes: Conjunctivae and EOM are normal. Pupils are equal, round, and reactive to light.  Neck: Normal range of motion. Neck supple. No thyromegaly present.  Cardiovascular: Normal rate, regular rhythm and normal heart sounds.  No murmur heard. Pulmonary/Chest: Effort normal and breath sounds normal. No respiratory distress. He has no wheezes. He has no rales.  Abdominal: Soft. Bowel sounds are normal. He exhibits no distension. There is no tenderness.  Lymphadenopathy:    He has no cervical adenopathy.  Neurological: He is alert and oriented to person, place, and time. He has normal reflexes.  Skin: Skin is warm and dry.  Psychiatric: He has a normal mood and affect. His behavior is normal. Judgment and thought content normal.       Assessment & Plan:   Jyquan was seen today for diabetes.  Diagnoses and all  orders for this visit:  Diabetic nephropathy associated with type 2 diabetes mellitus ([place]) -     Bayer DCA Hb A1c Waived  Essential hypertension -     CMP14+EGFR -     CBC with Differential/Platelet  Diabetic peripheral neuropathy associated with type 2 diabetes mellitus (HCC) -     HYDROcodone-acetaminophen (NORCO) 5-325 MG tablet; Take 1 tablet by mouth every 6 (six) hours as needed.  Uncomplicated opioid dependence (HCC) -     HYDROcodone-acetaminophen (NORCO) 5-325 MG tablet; Take 1 tablet by mouth every 6 (six) hours as needed.  Dyslipidemia -     Lipid panel  Testosterone deficiency -     Testosterone,Free and Total -     Testosterone (ANDROGEL PUMP) 20.25 MG/ACT (1.62%) GEL; Place 4 Applicatorfuls onto the skin daily.  Pain medication agreement signed -     HYDROcodone-acetaminophen (NORCO) 5-325 MG tablet; Take 1 tablet by mouth every 6 (six) hours as needed.  Other orders -     LORazepam (ATIVAN) 1 MG tablet; Take 1 tablet (1 mg total) by mouth at bedtime.   I am having Alexander Pennington start on LORazepam. I am also having him maintain his Multiple Vitamins, Coenzyme  U63, Folic Acid-Vit F3-LKT G25, gabapentin, linagliptin, insulin aspart, traMADol, Insulin Syringe-Needle U-100, olmesartan, INVOKANA, celecoxib, metformin, Testosterone, and HYDROcodone-acetaminophen.  Meds ordered this encounter  Medications  . Testosterone (ANDROGEL PUMP) 20.25 MG/ACT (1.62%) GEL    Sig: Place 4 Applicatorfuls onto the skin daily.    Dispense:  450 g    Refill:  1  . HYDROcodone-acetaminophen (NORCO) 5-325 MG tablet    Sig: Take 1 tablet by mouth every 6 (six) hours as needed.    Dispense:  90 tablet    Refill:  0  . LORazepam (ATIVAN) 1 MG tablet    Sig: Take 1 tablet (1 mg total) by mouth at bedtime.    Dispense:  30 tablet    Refill:  0     Follow-up: Return in about 3 months (around 12/16/2017).  Claretta Fraise, M.D.

## 2017-09-18 LAB — CBC WITH DIFFERENTIAL/PLATELET
BASOS: 1 %
Basophils Absolute: 0.1 10*3/uL (ref 0.0–0.2)
EOS (ABSOLUTE): 0.1 10*3/uL (ref 0.0–0.4)
EOS: 1 %
HEMATOCRIT: 54.8 % — AB (ref 37.5–51.0)
HEMOGLOBIN: 19 g/dL — AB (ref 13.0–17.7)
Immature Grans (Abs): 0 10*3/uL (ref 0.0–0.1)
Immature Granulocytes: 0 %
LYMPHS ABS: 1.4 10*3/uL (ref 0.7–3.1)
Lymphs: 17 %
MCH: 31.4 pg (ref 26.6–33.0)
MCHC: 34.7 g/dL (ref 31.5–35.7)
MCV: 90 fL (ref 79–97)
MONOCYTES: 8 %
Monocytes Absolute: 0.7 10*3/uL (ref 0.1–0.9)
NEUTROS ABS: 6.3 10*3/uL (ref 1.4–7.0)
Neutrophils: 73 %
Platelets: 216 10*3/uL (ref 150–379)
RBC: 6.06 x10E6/uL — ABNORMAL HIGH (ref 4.14–5.80)
RDW: 13.2 % (ref 12.3–15.4)
WBC: 8.5 10*3/uL (ref 3.4–10.8)

## 2017-09-18 LAB — CMP14+EGFR
ALBUMIN: 4.8 g/dL (ref 3.5–5.5)
ALT: 22 IU/L (ref 0–44)
AST: 20 IU/L (ref 0–40)
Albumin/Globulin Ratio: 1.7 (ref 1.2–2.2)
Alkaline Phosphatase: 86 IU/L (ref 39–117)
BILIRUBIN TOTAL: 0.3 mg/dL (ref 0.0–1.2)
BUN / CREAT RATIO: 19 (ref 9–20)
BUN: 18 mg/dL (ref 6–24)
CALCIUM: 10.6 mg/dL — AB (ref 8.7–10.2)
CO2: 20 mmol/L (ref 20–29)
CREATININE: 0.95 mg/dL (ref 0.76–1.27)
Chloride: 97 mmol/L (ref 96–106)
GFR, EST AFRICAN AMERICAN: 102 mL/min/{1.73_m2} (ref >59)
GFR, EST NON AFRICAN AMERICAN: 88 mL/min/{1.73_m2} (ref >59)
GLUCOSE: 262 mg/dL — AB (ref 65–99)
Globulin, Total: 2.9 g/dL (ref 1.5–4.5)
Potassium: 4.7 mmol/L (ref 3.5–5.2)
Sodium: 138 mmol/L (ref 134–144)
TOTAL PROTEIN: 7.7 g/dL (ref 6.0–8.5)

## 2017-09-18 LAB — LIPID PANEL
Chol/HDL Ratio: 5.9 ratio — ABNORMAL HIGH (ref 0.0–5.0)
Cholesterol, Total: 199 mg/dL (ref 100–199)
HDL: 34 mg/dL — ABNORMAL LOW (ref >39)
Triglycerides: 410 mg/dL — ABNORMAL HIGH (ref 0–149)

## 2017-09-18 LAB — TESTOSTERONE,FREE AND TOTAL
TESTOSTERONE: 456 ng/dL (ref 264–916)
Testosterone, Free: 15 pg/mL (ref 7.2–24.0)

## 2017-09-18 MED ORDER — SILDENAFIL CITRATE 20 MG PO TABS: ORAL_TABLET | ORAL | 5 refills | Status: DC

## 2017-09-18 NOTE — Addendum Note (Signed)
Addended by: Margurite AuerbachOMPTON, Ronique Simerly G on: 09/18/2017 10:32 AM   Modules accepted: Orders

## 2017-09-19 ENCOUNTER — Encounter: Payer: Self-pay | Admitting: *Deleted

## 2017-10-10 ENCOUNTER — Telehealth: Payer: Self-pay | Admitting: Family Medicine

## 2017-10-10 ENCOUNTER — Other Ambulatory Visit: Payer: Self-pay | Admitting: Family Medicine

## 2017-10-10 DIAGNOSIS — N529 Male erectile dysfunction, unspecified: Secondary | ICD-10-CM

## 2017-10-10 MED ORDER — TADALAFIL 20 MG PO TABS: 20.0000 mg | ORAL_TABLET | ORAL | 11 refills | Status: DC | PRN

## 2017-10-30 ENCOUNTER — Other Ambulatory Visit: Payer: Self-pay | Admitting: Family Medicine

## 2017-11-16 ENCOUNTER — Other Ambulatory Visit: Payer: Self-pay | Admitting: Family Medicine

## 2017-11-16 DIAGNOSIS — E1142 Type 2 diabetes mellitus with diabetic polyneuropathy: Secondary | ICD-10-CM

## 2017-11-18 DIAGNOSIS — N5201 Erectile dysfunction due to arterial insufficiency: Secondary | ICD-10-CM | POA: Diagnosis not present

## 2017-11-19 ENCOUNTER — Other Ambulatory Visit: Payer: Self-pay | Admitting: Family Medicine

## 2017-11-25 DIAGNOSIS — N5201 Erectile dysfunction due to arterial insufficiency: Secondary | ICD-10-CM | POA: Diagnosis not present

## 2017-12-13 ENCOUNTER — Other Ambulatory Visit: Payer: Self-pay | Admitting: Family Medicine

## 2017-12-16 ENCOUNTER — Telehealth: Payer: Self-pay

## 2017-12-16 ENCOUNTER — Ambulatory Visit (INDEPENDENT_AMBULATORY_CARE_PROVIDER_SITE_OTHER): Payer: BLUE CROSS/BLUE SHIELD | Admitting: Family Medicine

## 2017-12-16 ENCOUNTER — Encounter: Payer: Self-pay | Admitting: Family Medicine

## 2017-12-16 ENCOUNTER — Ambulatory Visit: Payer: BLUE CROSS/BLUE SHIELD | Admitting: Family Medicine

## 2017-12-16 VITALS — BP 137/75 | HR 75 | Temp 97.4°F | Ht 68.0 in | Wt 259.5 lb

## 2017-12-16 DIAGNOSIS — E349 Endocrine disorder, unspecified: Secondary | ICD-10-CM | POA: Diagnosis not present

## 2017-12-16 DIAGNOSIS — F1120 Opioid dependence, uncomplicated: Secondary | ICD-10-CM

## 2017-12-16 DIAGNOSIS — Z0289 Encounter for other administrative examinations: Secondary | ICD-10-CM | POA: Diagnosis not present

## 2017-12-16 DIAGNOSIS — E1142 Type 2 diabetes mellitus with diabetic polyneuropathy: Secondary | ICD-10-CM

## 2017-12-16 DIAGNOSIS — R011 Cardiac murmur, unspecified: Secondary | ICD-10-CM | POA: Diagnosis not present

## 2017-12-16 MED ORDER — HYDROCODONE-ACETAMINOPHEN 5-325 MG PO TABS: 1.0000 | ORAL_TABLET | Freq: Two times a day (BID) | ORAL | 0 refills | Status: DC

## 2017-12-16 MED ORDER — LINAGLIPTIN 5 MG PO TABS: 5.0000 mg | ORAL_TABLET | Freq: Every day | ORAL | 1 refills | Status: DC

## 2017-12-16 MED ORDER — CELECOXIB 200 MG PO CAPS: 200.0000 mg | ORAL_CAPSULE | Freq: Two times a day (BID) | ORAL | 1 refills | Status: DC

## 2017-12-16 MED ORDER — TESTOSTERONE 20.25 MG/ACT (1.62%) TD GEL: 4.0000 | Freq: Every day | TRANSDERMAL | 1 refills | Status: DC

## 2017-12-16 MED ORDER — GABAPENTIN 600 MG PO TABS: ORAL_TABLET | ORAL | 3 refills | Status: DC

## 2017-12-16 MED ORDER — CANAGLIFLOZIN 300 MG PO TABS: 300.0000 mg | ORAL_TABLET | Freq: Every day | ORAL | 1 refills | Status: DC

## 2017-12-16 MED ORDER — INSULIN ASPART 100 UNIT/ML ~~LOC~~ SOLN: 5.0000 [IU] | Freq: Three times a day (TID) | SUBCUTANEOUS | 3 refills | Status: DC

## 2017-12-16 MED ORDER — TRAMADOL HCL 50 MG PO TABS: 100.0000 mg | ORAL_TABLET | Freq: Three times a day (TID) | ORAL | 2 refills | Status: DC

## 2017-12-16 MED ORDER — "INSULIN SYRINGE-NEEDLE U-100 28G X 5/16"" 0.5 ML MISC": 1 refills | Status: DC

## 2017-12-16 MED ORDER — HYDROCODONE-ACETAMINOPHEN 5-325 MG PO TABS: 1.0000 | ORAL_TABLET | Freq: Four times a day (QID) | ORAL | 0 refills | Status: DC | PRN

## 2017-12-16 MED ORDER — LORAZEPAM 1 MG PO TABS: 1.0000 mg | ORAL_TABLET | Freq: Every evening | ORAL | 2 refills | Status: DC | PRN

## 2017-12-16 NOTE — Telephone Encounter (Signed)
Novolog not covered   Humalog vial and Stephanie CoupKwikpen are

## 2017-12-16 NOTE — Telephone Encounter (Signed)
Use humalog, same directions, etc.

## 2017-12-16 NOTE — Progress Notes (Signed)
Subjective:  Patient ID: Alexander Pennington,  male    DOB: [birth year]  Age: 57 y.o.    CC: Diabetes (pt here today for routine follow up of his chronic medical conditions )   HPI Alexander Pennington presents for  follow-up of hypertension. Patient has no history of headache chest pain or shortness of breath or recent cough. Patient also denies symptoms of TIA such as numbness weakness lateralizing. Patient checks  blood pressure at home. Recent readings have been good Patient denies side effects from medication. States taking it regularly.  Patient also  in for follow-up of elevated cholesterol. Doing well without complaints on current medication. Denies side effects of statin including myalgia and arthralgia and nausea. Also in today for liver function testing. Currently no chest pain, shortness of breath or other cardiovascular related symptoms noted.  Follow-up of diabetes. Patient does check blood sugar at home. Readings run between 130 and 190 Patient denies symptoms such as polyuria, polydipsia, excessive hunger, nausea No significant hypoglycemic spells noted. Medications reviewed. Pt reports taking them regularly. Pt. denies complication/adverse reaction today.     Patient tells me that his father passed away about a month ago.  He has been going through a lot of stress related to this and other family situations.  His pain level has been somewhat high because of this.  He has resorted to increasing his hydrocodone periodically due to his peripheral neuropathy brought on by the diabetes.  Patient has had amputations of several toes and had severe ulcers in the past on his feet. History Alexander Pennington has a past medical history of CAD (coronary artery disease), Charcot foot due to diabetes mellitus (Alexander Pennington), Complication of anesthesia, Diabetes mellitus (Alexander Pennington), Diabetic Charcot foot (Alexander Pennington), Family history of anesthesia complication, Hemorrhagic stroke (Alexander Pennington), Hyperlipidemia, Hypertension, Kidney stones, Murmur,  heart (Alexander Pennington - diagnosed), Neuropathy, Obesity, and Sleep apnea.   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   His family history includes Heart attack in his father.He reports that he quit smoking about 29 years ago. His smoking use included cigarettes. He has never used smokeless tobacco. He reports that he drinks alcohol. He reports that he does not use drugs.  Current Outpatient Medications on File Prior to Visit  Medication Sig Dispense Refill  . Coenzyme Q10 200 MG capsule Take 1 capsule (200 mg total) by mouth daily.    . Folic Acid-Vit S0-YTK Z60 ([name]) 2.5-25-1 MG TABS tablet Take 1 tablet by mouth daily. 90 tablet 1  . metformin ([name]) 1000 MG (OSM) 24 hr tablet TAKE 1 TABLET TWICE A DAY 180 tablet 0  . Multiple Vitamins tablet Take 1 tablet by mouth daily.    [name] olmesartan (BENICAR) 40 MG tablet Take 1 tablet (40 mg total) by mouth daily. (Patient not taking: Reported on 12/16/2017) 90 tablet 1   No current facility-administered medications on file prior to visit.     ROS Review of Systems  Constitutional: Negative.   HENT: Negative.   Eyes: Negative for visual disturbance.  Respiratory: Negative for cough and shortness of breath.   Cardiovascular: Negative for chest pain and leg swelling.  Gastrointestinal: Negative for abdominal pain, diarrhea, nausea and vomiting.  Genitourinary: Negative for difficulty urinating.  Musculoskeletal: Negative for arthralgias and myalgias.  Skin: Negative for rash.  Neurological: Positive for numbness (With pain in both lower extremities from neuropathy that can be electrical burning or numbness and quite severe  at times). Negative for headaches.  Psychiatric/Behavioral: Negative for sleep disturbance.    Objective:  BP 137/75   Pulse 75   Temp (!) 97.4 F (36.3 C) (Oral)   Ht _0  (1.727 m)   Wt 259 lb 8 oz (117.7 kg)   BMI 39.46 kg/m   BP Readings from Last 3 Encounters:  12/16/17 137/75  09/17/17 (!) 166/90  05/20/17 139/77    Wt Readings from Last 3 Encounters:  12/16/17 259 lb 8 oz (117.7 kg)  09/17/17 263 lb (119.3 kg)  05/20/17 269 lb (122 kg)     Physical Exam  Constitutional: He is oriented to person, place, and time. He appears well-developed and well-nourished. No distress.  HENT:  Head: Normocephalic and atraumatic.  Right Ear: External ear normal.  Left Ear: External ear normal.  Nose: Nose normal.  Mouth/Throat: Oropharynx is clear and moist.  Eyes: Pupils are equal, round, and reactive to light. Conjunctivae and EOM are normal.  Neck: Normal range of motion. Neck supple. No thyromegaly present.  Cardiovascular: Normal rate and regular rhythm.  Murmur (Although this has been present in the past it seems to have grown louder particularly at the aortic region but somewhat throughout the precordium) heard. Pulmonary/Chest: Effort normal and breath sounds normal. No respiratory distress. He has no wheezes. He has no rales.  Abdominal: Soft. Bowel sounds are normal. He exhibits no distension. There is no tenderness.  Lymphadenopathy:    He has no cervical adenopathy.  Neurological: He is alert and oriented to person, place, and time. He has normal reflexes.  Skin: Skin is warm and dry.  Psychiatric: He has a normal mood and affect. His behavior is normal. Judgment and thought content normal.    Diabetic Foot Exam - Simple   Simple Foot Form Visual Inspection See comments:  Yes Sensation Testing See comments:  Yes Pulse Check Comments There is surgical absence of the right fifth toe and the left fourth and fifth toes.  There is diminished sensation to touch  throughout.  Posterior tibials could not be palpated bilaterally.  Dorsalis pedis 1+ bilaterally       Assessment & Plan:   Alexander Pennington was seen today for diabetes.  Diagnoses and all orders for this visit:  Diabetic peripheral neuropathy associated with type 2 diabetes mellitus ([place]) -     CBC with Differential/Platelet -     CMP14+EGFR -     Microalbumin / creatinine urine ratio -     Bayer DCA Hb A1c Waived -     Referral to Nutrition and Diabetes Services -     gabapentin (NEURONTIN) 600 MG tablet; TAKE 2 TABLETS THREE TIMES A DAY -     canagliflozin (INVOKANA) 300 MG TABS tablet; Take 1 tablet (300 mg total) by mouth daily. -     traMADol (ULTRAM) 50 MG tablet; Take 2 tablets (100 mg total) by mouth 3 (three) times daily. -     HYDROcodone-acetaminophen (NORCO) 5-325 MG tablet; Take 1 tablet by mouth 2 (two) times daily.  Testosterone deficiency -     Testosterone (ANDROGEL PUMP) 20.25 MG/ACT (1.62%) GEL; Place 4 Applicatorfuls onto the skin daily.  Uncomplicated opioid dependence ([facility]) -     HYDROcodone-acetaminophen (NORCO) 5-325 MG tablet; Take 1 tablet by mouth 2 (two) times daily.  Pain medication agreement signed -  HYDROcodone-acetaminophen (NORCO) 5-325 MG tablet; Take 1 tablet by mouth 2 (two) times daily.  Murmur, cardiac -     Ambulatory referral to Cardiology  Other orders -     celecoxib (CELEBREX) 200 MG capsule; Take 1 capsule (200 mg total) by mouth 2 (two) times daily. -     insulin aspart (NOVOLOG) 100 UNIT/ML injection; Inject 5 Units into the skin 3 (three) times daily before meals. -     Insulin Syringe-Needle U-100 28G X 5/16" 0.5 ML MISC; Use to inject % units Novolog TID SubQ -     linagliptin (TRADJENTA) 5 MG TABS tablet; Take 1 tablet (5 mg total) by mouth daily. For diabetes -     LORazepam (ATIVAN) 1 MG tablet; Take 1 tablet (1 mg total) by mouth at bedtime as needed. -     HYDROcodone-acetaminophen (NORCO) 5-325 MG tablet; Take 1 tablet by mouth  2 (two) times daily. -     HYDROcodone-acetaminophen (NORCO) 5-325 MG tablet; Take 1 tablet by mouth every 6 (six) hours as needed for moderate pain.   I have discontinued Mack Guise. Letarte's sildenafil and tadalafil. I have changed his INVOKANA to canagliflozin. I have also changed his celecoxib, LORazepam, and HYDROcodone-acetaminophen. Additionally, I am having him start on HYDROcodone-acetaminophen and HYDROcodone-acetaminophen. Lastly, I am having him maintain his Multiple Vitamins, Coenzyme Y86, Folic Acid-Vit V7-QIO N62, olmesartan, metformin, gabapentin, insulin aspart, Insulin Syringe-Needle U-100, linagliptin, traMADol, and Testosterone.  Meds ordered this encounter  Medications  . gabapentin (NEURONTIN) 600 MG tablet    Sig: TAKE 2 TABLETS THREE TIMES A DAY    Dispense:  540 tablet    Refill:  3  . canagliflozin (INVOKANA) 300 MG TABS tablet    Sig: Take 1 tablet (300 mg total) by mouth daily.    Dispense:  90 tablet    Refill:  1  . celecoxib (CELEBREX) 200 MG capsule    Sig: Take 1 capsule (200 mg total) by mouth 2 (two) times daily.    Dispense:  180 capsule    Refill:  1  . insulin aspart (NOVOLOG) 100 UNIT/ML injection    Sig: Inject 5 Units into the skin 3 (three) times daily before meals.    Dispense:  3 vial    Refill:  3  . Insulin Syringe-Needle U-100 28G X 5/16" 0.5 ML MISC    Sig: Use to inject % units Novolog TID SubQ    Dispense:  270 each    Refill:  1  . linagliptin (TRADJENTA) 5 MG TABS tablet    Sig: Take 1 tablet (5 mg total) by mouth daily. For diabetes    Dispense:  90 tablet    Refill:  1  . traMADol (ULTRAM) 50 MG tablet    Sig: Take 2 tablets (100 mg total) by mouth 3 (three) times daily.    Dispense:  180 tablet    Refill:  2  . Testosterone (ANDROGEL PUMP) 20.25 MG/ACT (1.62%) GEL    Sig: Place 4 Applicatorfuls onto the skin daily.    Dispense:  450 g    Refill:  1  . LORazepam (ATIVAN) 1 MG tablet    Sig: Take 1 tablet (1 mg total) by mouth  at bedtime as needed.    Dispense:  30 tablet    Refill:  2  . HYDROcodone-acetaminophen (NORCO) 5-325 MG tablet    Sig: Take 1 tablet by mouth 2 (two) times daily.    Dispense:  60 tablet  Refill:  0  . HYDROcodone-acetaminophen (NORCO) 5-325 MG tablet    Sig: Take 1 tablet by mouth 2 (two) times daily.    Dispense:  60 tablet    Refill:  0  . HYDROcodone-acetaminophen (NORCO) 5-325 MG tablet    Sig: Take 1 tablet by mouth every 6 (six) hours as needed for moderate pain.    Dispense:  60 tablet    Refill:  0     Follow-up: Return in about 3 months (around 03/17/2018).  Claretta Fraise, M.D.

## 2017-12-16 NOTE — Telephone Encounter (Signed)
Patient aware and states he has plenty of novolog for now

## 2017-12-16 NOTE — Patient Instructions (Signed)

## 2017-12-21 ENCOUNTER — Encounter: Payer: Self-pay | Admitting: Family Medicine

## 2017-12-22 ENCOUNTER — Telehealth: Payer: Self-pay | Admitting: Family Medicine

## 2017-12-22 NOTE — Telephone Encounter (Signed)
The e-prescribing status shows that the pharmacy confirmed receipt of the prescription. Please conform with [facility] via phone. Thanks, WS

## 2017-12-22 NOTE — Telephone Encounter (Signed)
Patients says that [facility] in [place] does not have his hydrocodone. I advised that it had been sent but he states they still didn't receive. Please advise

## 2017-12-23 NOTE — Telephone Encounter (Signed)
[facility]  She confirmed they did receive three 30 day prescriptions for Hydrocodone and reports that the one dated 12/16/17 had been picked up.  I contacted the patient and he said that he had picked up the Lorazepam the other day but had not opened the bag so it was possible the Hydrocone was in there also.  He will check the bag.

## 2018-01-22 ENCOUNTER — Ambulatory Visit (INDEPENDENT_AMBULATORY_CARE_PROVIDER_SITE_OTHER): Payer: BLUE CROSS/BLUE SHIELD | Admitting: Cardiology

## 2018-01-22 ENCOUNTER — Encounter: Payer: Self-pay | Admitting: Cardiology

## 2018-01-22 VITALS — BP 152/82 | HR 88 | Ht 68.0 in | Wt 264.8 lb

## 2018-01-22 DIAGNOSIS — E1169 Type 2 diabetes mellitus with other specified complication: Secondary | ICD-10-CM | POA: Diagnosis not present

## 2018-01-22 DIAGNOSIS — I619 Nontraumatic intracerebral hemorrhage, unspecified: Secondary | ICD-10-CM | POA: Diagnosis not present

## 2018-01-22 DIAGNOSIS — E1142 Type 2 diabetes mellitus with diabetic polyneuropathy: Secondary | ICD-10-CM | POA: Diagnosis not present

## 2018-01-22 DIAGNOSIS — Z794 Long term (current) use of insulin: Secondary | ICD-10-CM

## 2018-01-22 DIAGNOSIS — R011 Cardiac murmur, unspecified: Secondary | ICD-10-CM | POA: Diagnosis not present

## 2018-01-22 DIAGNOSIS — I2510 Atherosclerotic heart disease of native coronary artery without angina pectoris: Secondary | ICD-10-CM

## 2018-01-22 DIAGNOSIS — I10 Essential (primary) hypertension: Secondary | ICD-10-CM | POA: Diagnosis not present

## 2018-01-22 DIAGNOSIS — E118 Type 2 diabetes mellitus with unspecified complications: Secondary | ICD-10-CM | POA: Diagnosis not present

## 2018-01-22 DIAGNOSIS — E785 Hyperlipidemia, unspecified: Secondary | ICD-10-CM | POA: Diagnosis not present

## 2018-01-22 DIAGNOSIS — G4733 Obstructive sleep apnea (adult) (pediatric): Secondary | ICD-10-CM

## 2018-01-22 MED ORDER — BENICAR 40 MG PO TABS: 40.0000 mg | ORAL_TABLET | Freq: Every day | ORAL | 3 refills | Status: DC

## 2018-01-22 NOTE — Progress Notes (Addendum)
PCP: Alexander Claude, MD  Clinic Note: Chief Complaint  Patient presents with  . Follow-up    -- lost to f/u with, last visit in 2015 (Alexander Pennington)  . Coronary Artery Disease  . Heart Murmur    previous Echo showed Aortic Sclerosis    HPI: Alexander Pennington is a 58 y.o. male (PMH Moderate CAD,  obesity s/p lap band 2009, diabetes mellitus complicated by-neuropathy, HTN, OSA compliant with CPAP, hemorrhagic stroke 2010 of unclear etiology, dyslipidemia intolerant of statins) who is being seen today for the evaluation of "murmur"  at the request of Alexander Claude, MD.  Alexander Pennington was initially seen by cardiology (Alexander Pennington) in consultation -this was in the setting of diabetic foot ulcer in the left foot leading to osteomyelitis.  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 He was seen by Alexander Pennington on August 16, 2014 lost to follow-up since.  Recent Hospitalizations: none  Studies Personally Reviewed - (if available, images/films reviewed: From Epic Chart or Care Everywhere) - PSH/PMH updated.  Transthoracic Echo September 2015: Mild LVH.  EF 45 to 50%.  Cannot exclude regional wall motion abnormality.  GR 1 DD.  Aortic sclerosis but no stenosis  [facility] Myoview September 2015: Intermediate risk.  EF 54%.  Medium sized, mild density reversible defect or wall.  Cannot exclude infarct versus diaphragmatic attenuation.  Cardiac Cath June 16, 2014: Heavily calcified LAD.  Patent throughout.  Mild luminal irregularities.  Large D1 30 and 40% ostial. LCx patent.  OM1 large caliber with proximal 75%.  AV groove branch beyond the OM has 70% focal.  Dominant RCA with mid 40 and 50%.  Moderate diffuse calcification.  Distal 60% prior to PDA.  EF estimated 65-70%. -->  Recommended medical management and proceed with surgery.  Consider PCI if ischemia develops.  Interval History: Patient returns for cardiology follow-up at the request of his PCP, not for the CAD, but  for the murmur that was also previously diagnosed as aortic sclerosis without stenosis.  Alexander Pennington himself seems to have relatively poor insight when he comes to understanding his health with diabetes (complicated by PAD, amputation and Charcot foot), hypertension and documented coronary disease.  He does not seem to be all that interested in taking medications, and is pretty much refusing to take a statin.  He is not on any blood pressure medications because he never got his Benicar refilled to replace his Diovan (changed because of the Diovan recall).  Unfortunately for Alexander Pennington, his exercise is pretty much limited now because of his foot.  He has had amputation on one foot and a pretty bad wound on the other foot related to  Charcot foot affecting his right foot and amputation on the left.  He does have balance this thrown off a bit because of his missing toes, and wears tight work boots to help with the neuropathy pain.  He says his Charcot foot and neuropathy pains of notably improved as a result of him rubbing testosterone gel on his legs etc.  Thankfully, since he has not been seen for several years from a cardiac standpoint, he is not noticing any symptoms of chest tightness or pressure with rest or exertion.  He does have exertional dyspnea partially because of deconditioning.  He denies any rapid heartbeats or palpitations.  He does have some balance issues with some occasional dizziness and vertigo, but no syncope/near syncope or TIA/amaurosis fugax.   No melena,  hematochezia, hematuria, or epstaxis. He indicates that he does deny claudication.  ROS: A comprehensive was performed. Review of Systems  Constitutional: Positive for malaise/fatigue (Mostly because he denied we will do much now.). Negative for chills, diaphoresis and fever.  HENT: Negative for congestion and nosebleeds.   Respiratory: Negative for cough, shortness of breath and wheezing.   Gastrointestinal: Negative for abdominal  pain, blood in stool, constipation, heartburn and melena.  Genitourinary: Negative for hematuria.  Musculoskeletal: Positive for back pain and joint pain.       Foot pain; Unsteady gait  Neurological: Positive for tingling. Negative for dizziness, focal weakness and headaches.  Psychiatric/Behavioral: Negative for depression and memory loss. The patient is not nervous/anxious and does not have insomnia.   All other systems reviewed and are negative.   I have reviewed and (if needed) personally updated the patient's problem list, medications, allergies, past medical and surgical history, social and family history.   Past Medical History:  Diagnosis Date  . CAD (coronary artery disease) 06/2014   Cath: Heavily calcified LAD with mild  diffuse disease, Large d1 ~30&40%. LCx ok up to Large OM1 where there is ~70% focal disease in the AVG Cx, proxOM1 75%. Dominant RCA - mid 40% & 50% with moderate diffuse calcification, prior to PDA ~60%. EF 65-70% --> Recommended Med Cx (PCI if Sx warrant)  . Charcot foot due to diabetes mellitus (HCC)   . Complication of anesthesia    Pt. reports that he has done well the last several surgeries. hx deviated septum, sleep apnea- has had problems. Pt. reports that the tube was pulled out too soon, due to sleep apnea , decreasing sats, & when they tried to put it back it went into his sinus, this event occured with Lap Band surgery when he had a lot more weight on him   . Diabetes mellitus (HCC)    type 2  . Diabetic Charcot foot (HCC)   . Family history of anesthesia complication    mom with n/v  . Hemorrhagic stroke (HCC)    a. Dx 2010. 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 Unclear etiology. right eye damaged - has no central vision, has a little problem with balance  . Hyperlipidemia   . Hypertension   . Kidney stones   . Murmur, heart Fall 2014 - diagnosed  . Neuropathy   . Obesity    a. hx of lap band surgery.  . Sleep apnea    uses c-pap    Past  Surgical History:  Procedure Laterality Date  . AMPUTATION Left 07/06/2014   Procedure: Left 2nd and Possible 3rd Ray Amputation;  Surgeon: Alexander Mustard, MD;  Location: [facility] OR;  Service: Orthopedics;  Laterality: Left;  . AMPUTATION Left 09/23/2014   Procedure: Foot 5th Ray Amputation;  Surgeon: Alexander Mustard, MD;  Location: [facility] OR;  Service: Orthopedics;  Laterality: Left;  . CARPAL TUNNEL RELEASE Left    bone removed also, nerve also cut  . COLONOSCOPY    . FOOT SURGERY     foot surgery x 2  . HARDWARE REMOVAL Left 07/06/2014   Procedure: Removal Deep Hardware Left Foot;  Surgeon: Alexander Mustard, MD;  Location: [facility] OR;  Service: Orthopedics;  Laterality: Left;  . I&D EXTREMITY Bilateral 06/17/2014   Procedure: IRRIGATION AND DEBRIDEMENT BILATERAL FOOT WOUNDS;  Surgeon: Kathryne Hitch, MD;  Location: [facility] ORS;  Service: Orthopedics;  Laterality: Bilateral;  . I&D EXTREMITY Right 11/04/2014   Procedure: Excision Charcot  Collapse Right Foot;  Surgeon: Alexander Mustard, MD;  Location: [facility] OR;  Service: Orthopedics;  Laterality: Right;  . lap band surgery     2009  . LEFT HEART CATHETERIZATION WITH CORONARY ANGIOGRAM N/A 06/16/2014   Procedure: LEFT HEART CATHETERIZATION WITH CORONARY ANGIOGRAM;  Surgeon: Micheline Chapman, MD;  Location: [facility] CATH LAB;  Service: Cardiovascular: * Heavily calcified LAD with mild  diffuse disease, Large d1 ~30&40%. LCx ok up to Large OM1 where there is ~70% focal disease in the AVG Cx, proxOM1 75%. Dominant RCA - mid 40% & 50% with mod diffuse calcification, prior to PDA ~60%. EF 65-70% -  . NM MYOVIEW LTD  05/2014   Intermediate risk.  EF 54%.  Medium sized, mild density reversible defect or wall.  Cannot exclude infarct versus diaphragmatic attenuation. --> Cath revealed OM1/ AVG Cx & dRCA moderate disease -Med Rx recommended  . TRANSTHORACIC ECHOCARDIOGRAM  05/2014    Mild LVH.  EF 45 to 50%.  Cannot exclude regional wall motion abnormality.  GR 1 DD.  Aortic sclerosis  but no stenosis  . TRANSTHORACIC ECHOCARDIOGRAM  2005; 2014   Normal EF.  Mild LVH.  GRII DD;;  by his report, he had an echo at [facility] for heart murmur -  that showed "85% capacity & mildly leaky valve  . VASECTOMY     Current Meds  Medication Sig  . canagliflozin (INVOKANA) 300 MG TABS tablet Take 1 tablet (300 mg total) by mouth daily.  . celecoxib (CELEBREX) 200 MG capsule Take 1 capsule (200 mg total) by mouth 2 (two) times daily.  . Coenzyme Q10 200 MG capsule Take 1 capsule (200 mg total) by mouth daily.  [name] gabapentin (NEURONTIN) 600 MG tablet TAKE 2 TABLETS THREE TIMES A DAY  . HYDROcodone-acetaminophen (NORCO) 5-325 MG tablet Take 1 tablet by mouth 2 (two) times daily.  Melene Muller ON 02/15/2018] HYDROcodone-acetaminophen (NORCO) 5-325 MG tablet Take 1 tablet by mouth every 6 (six) hours as needed for moderate pain.  [name] insulin aspart (NOVOLOG) 100 UNIT/ML injection Inject 5 Units into the skin 3 (three) times daily before meals.  . Insulin Syringe-Needle U-100 28G X 5/16" 0.5 ML MISC Use to inject % units Novolog TID SubQ  . LORazepam (ATIVAN) 1 MG tablet Take 1 tablet (1 mg total) by mouth at bedtime as needed.  . metformin ([name]) 1000 MG (OSM) 24 hr tablet TAKE 1 TABLET TWICE A DAY  . Multiple Vitamins tablet Take 1 tablet by mouth daily.  . Testosterone (ANDROGEL PUMP) 20.25 MG/ACT (1.62%) GEL Place 4 Applicatorfuls onto the skin daily.  . traMADol (ULTRAM) 50 MG tablet Take 2 tablets (100 mg total) by mouth 3 (three) times daily.  --He is also taking 81 mg aspirin daily.   Allergies  Allergen Reactions  . Beta Adrenergic Blockers Nausea And Vomiting  . Tetanus Toxoids     "Sore arm all the way down, Painful, red"  . Ciprofloxacin Other (See Comments)    Altered mental status  . Eggs Or Egg-Derived Products Diarrhea    Constant flu after flu shot.  . Labetalol Hcl Nausea Only  . Lansoprazole Other (See Comments)    Agitation  . Nsaids Other (See Comments)     Cannot take nsaids-  etc due to heart   . Other     All betablockers make him nauseated ("vertical vomiting")  . Statins Other (See Comments)    York Spaniel it causes pain?    Social History  Tobacco Use  . Smoking status: Former Smoker    Types: Cigarettes    Last attempt to quit: 06/13/1988    Years since quitting: 29.6  . Smokeless tobacco: Never Used  . Tobacco comment: Smoked for ~8 yrs.  Substance Use Topics  . Alcohol use: Yes    Comment: three times a week, one 12-oz cider  . Drug use: No   Social History   Social History Narrative  . Not on file    family history includes Heart attack in his father.  Wt Readings from Last 3 Encounters:  01/22/18 264 lb 12.8 oz (120.1 kg)  12/16/17 259 lb 8 oz (117.7 kg)  09/17/17 263 lb (119.3 kg)    PHYSICAL EXAM BP (!) 152/82 (BP Location: Right Arm)   Pulse 88   Ht  (1.727 m)   Wt 264 lb 12.8 oz (120.1 kg)   BMI 40.26 kg/m  Physical Exam  Constitutional: He is oriented to person, place, and time. He appears well-developed and well-nourished.  Morbidly obese; very garulous - rambling  Eyes: EOM are normal.  Irregular pupils  Neck: Normal range of motion. Neck supple. No hepatojugular reflux and no JVD present. Carotid bruit is not present (radiated murmur).  Cardiovascular: Normal rate and regular rhythm.  No extrasystoles are present. PMI is not displaced. Exam reveals decreased pulses (pedal). Exam reveals no gallop and no friction rub.  Murmur heard.  Medium-pitched harsh crescendo-decrescendo midsystolic murmur is present with a grade of 3/6 at the upper right sternal border radiating to the neck. Pulmonary/Chest: Effort normal and breath sounds normal. No respiratory distress. He has no wheezes. He has no rales.  Abdominal: Soft. Bowel sounds are normal. He exhibits no distension. There is no tenderness. There is no rebound.  Unable to palpate HSM  Neurological: He is alert and oriented to person, place, and time.  No cranial nerve deficit.  Skin: Skin is warm and dry.  sweaty  Psychiatric: He has a normal mood and affect. His behavior is normal. Judgment and thought content normal.  Nursing note and vitals reviewed.    Adult ECG Report  Rate: 88 ;  Rhythm: normal sinus rhythm and Normal axis, intervals and durations;   Narrative Interpretation: Normal   Other studies Reviewed: Additional studies/ records that were reviewed today include:  Recent Labs:   Lab Results  Component Value Date   CREATININE 0.95 09/17/2017   BUN 18 09/17/2017   NA 138 09/17/2017   K 4.7 09/17/2017   CL 97 09/17/2017   CO2 20 09/17/2017   Lab Results  Component Value Date   CHOL 199 09/17/2017   HDL 34 (L) 09/17/2017   LDLCALC Comment 09/17/2017   TRIG 410 (H) 09/17/2017   CHOLHDL 5.9 (H) 09/17/2017  -- Unable to calculate LDL 2/2 high TG  ASSESSMENT / PLAN: Problem List Items Addressed This Visit    Type 2 diabetes mellitus with complication, with long-term current use of insulin (HCC) (Chronic)    In addition to metformin and insulin with meals, he has been started on Invokana which is in the same category as Jardiance, just not as robust data.      Relevant Medications   BENICAR 40 MG tablet   Sleep apnea (Chronic)    On CPAP      Obesity    Unfortunately, he is not really able to exercise very well because of his feet.  Talked about potentially doing water exercises in addition to adjusting his  diet.      Hyperlipidemia associated with type 2 diabetes mellitus (HCC) (Chronic)    Not on Statin - citing side effects --> has tried multiple (may need documented proof) -> refer to CVRR to consider PCSK9 Inhibitor.  Per patient, he says his glycemic control has improved on insulin plus metformin and Invokana.      Relevant Medications   BENICAR 40 MG tablet   Heart murmur (Chronic)    Recent Echo shows aortic sclerosis no stenosis. Since he is may be little more short of breath than usual, and  the murmur sounds relatively prominent, we will recheck an echocardiogram.      Relevant Orders   EKG 12-Lead (Completed)   ECHOCARDIOGRAM COMPLETE   H/o Hemorrhagic stroke 2010 (Chronic)    Would be reluctant to consider PCI which are relegated him to taking an antiplatelet agent.        Essential hypertension (Chronic)    Not actively controlled.  Was switched from Diovan to Benicar but he never refilled it. Plan: Restart Benicar 40 mg daily.  If pressure still become elevated would use beta-blocker (carvedilol).      Relevant Medications   BENICAR 40 MG tablet   Diabetic peripheral neuropathy associated with type 2 diabetes mellitus (HCC) (Chronic)   Relevant Medications   BENICAR 40 MG tablet   Coronary artery disease involving native coronary artery without angina pectoris - Primary (Chronic)    Moderate three-vessel disease involving the AV groove circumflex, OM and distal right coronary artery.  Overall doing fine from a cardiac standpoint with no active anginal symptoms.  Plan: Continue aspirin (not listed)  Restart ARB for blood pressure control  Reluctant to use beta-blocker because of his somewhat labile diabetes control; if tolerates being back on ARB, with next step would be to add beta-blocker   Refer to [facility] ([facility] - run by our Pharm-D staff)  Currently on Invokana (in the same category of Jardiance, but does not have the robust data from a cardiovascular risk reduction standpoint)      Relevant Medications   BENICAR 40 MG tablet   Other Relevant Orders   EKG 12-Lead (Completed)   ECHOCARDIOGRAM COMPLETE      I spent a total of 65 minutes with the patient and chart review. >  50% of the time was spent in direct patient consultation.   Current medicines are reviewed at length with the patient today.  (+/- concerns) - not taking Benicar, refuses statin The following changes have been made:  see below  Patient Instructions    MEDICATION INSTRUCTION  RESTART BENICAR 40 MG ONE TABLET DAILY    TEST- [address]  Your physician has requested that you have an echocardiogram. Echocardiography is a painless test that uses sound waves to create images of your heart. It provides your doctor with information about the size and shape of your heart and how well your heart's chambers and valves are working. This procedure takes approximately one hour. There are no restrictions for this procedure.    Your physician recommends that you schedule a follow-up appointment in CVRR TO DISCUSS CHOLESTEROL    Your physician recommends that you schedule a follow-up appointment in 2 MONTHS WITH DR HARDING.   If you need a refill on your cardiac medications before your next appointment, please call your pharmacy.     Studies Ordered:   Orders Placed This Encounter  Procedures  . EKG 12-Lead  .  ECHOCARDIOGRAM COMPLETE      Bryan Lemma, M.D., M.S. Interventional Cardiologist   Pager # [phone] Phone # [phone] [address] [place], [place] [address]   Thank you for choosing [facility] at [facility]!!

## 2018-01-22 NOTE — Patient Instructions (Addendum)
MEDICATION INSTRUCTION  RESTART BENICAR 40 MG ONE TABLET DAILY    TEST- [address]  Your physician has requested that you have an echocardiogram. Echocardiography is a painless test that uses sound waves to create images of your heart. It provides your doctor with information about the size and shape of your heart and how well your heart's chambers and valves are working. This procedure takes approximately one hour. There are no restrictions for this procedure.    Your physician recommends that you schedule a follow-up appointment in CVRR TO DISCUSS CHOLESTEROL    Your physician recommends that you schedule a follow-up appointment in 2 MONTHS WITH DR HARDING.   If you need a refill on your cardiac medications before your next appointment, please call your pharmacy.

## 2018-01-26 ENCOUNTER — Encounter: Payer: Self-pay | Admitting: Cardiology

## 2018-01-26 NOTE — Assessment & Plan Note (Addendum)
Moderate three-vessel disease involving the AV groove circumflex, OM and distal right coronary artery.  Overall doing fine from a cardiac standpoint with no active anginal symptoms.  Plan: Continue aspirin (not listed)  Restart ARB for blood pressure control  Reluctant to use beta-blocker because of his somewhat labile diabetes control; if tolerates being back on ARB, with next step would be to add beta-blocker   Refer to CV[facility] ([facility] - run by our Pharm-D staff)  Currently on Invokana (in the same category of Jardiance, but does not have the robust data from a cardiovascular risk reduction standpoint)

## 2018-01-26 NOTE — Assessment & Plan Note (Signed)
Not actively controlled.  Was switched from Diovan to Benicar but he never refilled it. Plan: Restart Benicar 40 mg daily.  If pressure still become elevated would use beta-blocker (carvedilol).

## 2018-01-26 NOTE — Assessment & Plan Note (Addendum)
Not on Statin - citing side effects --> has tried multiple (may need documented proof) -> refer to CVRR to consider PCSK9 Inhibitor.  Per patient, he says his glycemic control has improved on insulin plus metformin and Invokana.

## 2018-01-26 NOTE — Assessment & Plan Note (Signed)
Recent Echo shows aortic sclerosis no stenosis. Since he is may be little more short of breath than usual, and the murmur sounds relatively prominent, we will recheck an echocardiogram.

## 2018-01-26 NOTE — Assessment & Plan Note (Signed)
On CPAP. ?

## 2018-01-26 NOTE — Assessment & Plan Note (Signed)
In addition to metformin and insulin with meals, he has been started on Invokana which is in the same category as Jardiance, just not as robust data.

## 2018-01-26 NOTE — Assessment & Plan Note (Signed)
Would be reluctant to consider PCI which are relegated him to taking an antiplatelet agent.

## 2018-01-26 NOTE — Assessment & Plan Note (Signed)
Unfortunately, he is not really able to exercise very well because of his feet.  Talked about potentially doing water exercises in addition to adjusting his diet.

## 2018-01-29 ENCOUNTER — Ambulatory Visit (INDEPENDENT_AMBULATORY_CARE_PROVIDER_SITE_OTHER): Payer: BLUE CROSS/BLUE SHIELD | Admitting: Pharmacist Clinician (PhC)/ Clinical Pharmacy Specialist

## 2018-01-29 ENCOUNTER — Encounter: Payer: Self-pay | Admitting: Pharmacist Clinician (PhC)/ Clinical Pharmacy Specialist

## 2018-01-29 DIAGNOSIS — E1169 Type 2 diabetes mellitus with other specified complication: Secondary | ICD-10-CM

## 2018-01-29 DIAGNOSIS — E785 Hyperlipidemia, unspecified: Secondary | ICD-10-CM | POA: Diagnosis not present

## 2018-01-29 NOTE — Progress Notes (Signed)
01/29/2018 Alexander Pennington Nov 07, 1960 [id number]   HPI:  Alexander Pennington is a 57 y.o. male patient of Dr Herbie Baltimore, who presents today for a lipid clinic evaluation.  His CAD consists of a heavily calcified LAD, and > 50% calcifications in OM1, AV groove and PDA.   His other medical history includes hemorrhagic stroke, hypertension, testosterone deficiency, diabetes with nephropathy and neuropathy, obesity (post lap band in 2009), and sleep apnea.  In addition he suffers from Charcot foot and has lost 3 toes on one foot and has had wound problems on the other.  He wears tight work boots to help with neuropathy and stability.  He has lost 160 pounds since his surgery.  Patient complains of heightened sensitivities to many medications.  He had done well with Diovan, but because of the recall was switched to Benicar.  He reports that this "floored"him and has caused severe constipation.  His diabetes is poorly controlled, today he believed to be having a low blood sugar so ate a banana.  As he sat in the office he continued to be diaphoretic so I checked his blood sugar.  It was 294.  I would suspect that he begins to feel hypoglycemic at a fairly high level.  He admits that he never checks his sugar when he feels it "crash", or even after having something to eat.    Current Medications:  None  Cholesterol Goals:   LDL < 70 Intolerant/previously tried: - back pain  pitavastatin 2 mg  Pravastatin 20 mg  Family history:   Father first MI at 61, had CABG, then again at 19; died recently at 77  Mother still alive, 39, in good health for 63 (mental illness runs in her family)  One brother had heart valve replaced at 70  Another brother died from cancer at 73 (anal)   Diet:    Eats out only 1-2 times per week (Sushi every Saturday); fried chicken once monthly; eats a lot of salad, avocados; fish (pollock), some ground beef/turkey; veggies are fresh, occasionally frozen; grapes, bananas most days; protein shake  and toast each morning.  Exercise:    Unable to do much d/t -3 toes on one foot; gait problems due to stroke; has recumbant bike, did about 1 km yesterday, does mow own yard;  Labs:   09/17/17:  TC 199, TG 410, HDL 34, LDL not calculated  Current Outpatient Medications  Medication Sig Dispense Refill  . BENICAR 40 MG tablet Take 1 tablet (40 mg total) by mouth daily. 90 tablet 3  . canagliflozin (INVOKANA) 300 MG TABS tablet Take 1 tablet (300 mg total) by mouth daily. 90 tablet 1  . celecoxib (CELEBREX) 200 MG capsule Take 1 capsule (200 mg total) by mouth 2 (two) times daily. 180 capsule 1  . Coenzyme Q10 200 MG capsule Take 1 capsule (200 mg total) by mouth daily.    [name] gabapentin (NEURONTIN) 600 MG tablet TAKE 2 TABLETS THREE TIMES A DAY 540 tablet 3  . HYDROcodone-acetaminophen (NORCO) 5-325 MG tablet Take 1 tablet by mouth 2 (two) times daily. 60 tablet 0  . [START ON 02/15/2018] HYDROcodone-acetaminophen (NORCO) 5-325 MG tablet Take 1 tablet by mouth every 6 (six) hours as needed for moderate pain. 60 tablet 0  . insulin aspart (NOVOLOG) 100 UNIT/ML injection Inject 5 Units into the skin 3 (three) times daily before meals. 3 vial 3  . Insulin Syringe-Needle U-100 28G X 5/16" 0.5 ML MISC Use to inject % units Novolog  TID SubQ 270 each 1  . LORazepam (ATIVAN) 1 MG tablet Take 1 tablet (1 mg total) by mouth at bedtime as needed. 30 tablet 2  . metformin ([name]) 1000 MG (OSM) 24 hr tablet TAKE 1 TABLET TWICE A DAY 180 tablet 0  . Multiple Vitamins tablet Take 1 tablet by mouth daily.    . Testosterone (ANDROGEL PUMP) 20.25 MG/ACT (1.62%) GEL Place 4 Applicatorfuls onto the skin daily. 450 g 1  . traMADol (ULTRAM) 50 MG tablet Take 2 tablets (100 mg total) by mouth 3 (three) times daily. 180 tablet 2   No current facility-administered medications for this visit.     Allergies  Allergen Reactions  . Beta Adrenergic Blockers Nausea And Vomiting  . Tetanus Toxoids     "Sore arm all the  way down, Painful, red"  . Ciprofloxacin Other (See Comments)    Altered mental status  . Eggs Or Egg-Derived Products Diarrhea    Constant flu after flu shot.  . Labetalol Hcl Nausea Only  . Lansoprazole Other (See Comments)    Agitation  . Nsaids Other (See Comments)    Cannot take nsaids-  etc due to heart   . Other     All betablockers make him nauseated ("vertical vomiting")  . Statins Other (See Comments)    York Spaniel it causes pain?    Past Medical History:  Diagnosis Date  . CAD (coronary artery disease) 06/2014   Cath: Heavily calcified LAD with mild  diffuse disease, Large d1 ~30&40%. LCx ok up to Large OM1 where there is ~70% focal disease in the AVG Cx, proxOM1 75%. Dominant RCA - mid 40% & 50% with moderate diffuse calcification, prior to PDA ~60%. EF 65-70% --> Recommended Med Cx (PCI if Sx warrant)  . Charcot foot due to diabetes mellitus (HCC)   . Complication of anesthesia    Pt. reports that he has done well the last several surgeries. hx deviated septum, sleep apnea- has had problems. Pt. reports that the tube was pulled out too soon, due to sleep apnea , decreasing sats, & when they tried to put it back it went into his sinus, this event occured with Lap Band surgery when he had a lot more weight on him   . Diabetes mellitus (HCC)    type 2  . Diabetic Charcot foot (HCC)   . Family history of anesthesia complication    mom with n/v  . Hemorrhagic stroke (HCC)    a. Dx 2010. 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 Unclear etiology. right eye damaged - has no central vision, has a little problem with balance  . Hyperlipidemia   . Hypertension   . Kidney stones   . Murmur, heart Fall 2014 - diagnosed  . Neuropathy   . Obesity    a. hx of lap band surgery.  . Sleep apnea    uses c-pap    There were no vitals taken for this visit.   Hyperlipidemia associated with type 2 diabetes mellitus (HCC) Patient has not had noticeable problems with triglycerides in the  past.  Because his labs are 4 months old, will have him repeat lipid panel before making any medication changes.  If his triglycerides remain high, would recommend that he start with Vascepa 1 gm twice daily and increase to 2 gm bid as tolerated.  If they are back to < 300, will not have him use this.  He was given a sample of Livalo 2 mg today and in  another week (when he has adjusted to his Benicar), he will start with 1 mg (1/2 tablet) once weekly.  If the LDL is significantly elevated, will also start paperwork for PCSK-9 inhibitor.   Because of his desire to take lowest possible doses of medication, will see if we can get Praluent 75 mg approved.     Phillips Hay PharmD CPP [facility] [address]

## 2018-01-29 NOTE — Patient Instructions (Signed)
Repeat Lipid labs in the next week or two.    If your triglycerides are still high we will start Vascepa 1 gm twice daily and increase to 2 gm twice daily over the course of a month   We will also have you start Livalo 1 mg once weekly.  After 4 weeks if you are doing well we will increase that to twice weekly.  Depending on the LDL cholesterol results we will also start the paperwork to get Praluent 75 mg every 2 weeks.    Alirocumab injection What is this medicine? ALIROCUMAB (al i ROC ue mab) is known as a PCSK9 inhibitor. It is used to lower the level of cholesterol in the blood. This medicine is only for patients whose cholesterol is not controlled by diet and statin therapy. This medicine may be used for other purposes; ask your health care provider or pharmacist if you have questions. COMMON BRAND NAME(S): Praluent What should I tell my health care provider before I take this medicine? They need to know if you have any of these conditions: -any unusual or allergic reaction to alirocumab, other medicines, foods, dyes, or preservatives -pregnant or trying to get pregnant -breast-feeding How should I use this medicine? This medicine is for injection under the skin. You will be taught how to prepare and give this medicine. Use exactly as directed. Take your medicine at regular intervals. Do not take your medicine more often than directed. It is important that you put your used needles and syringes in a special sharps container. Do not put them in a trash can. If you do not have a sharps container, call your pharmacist or healthcare provider to get one. Talk to your pediatrician regarding the use of this medicine in children. Special care may be needed. Overdosage: If you think you have taken too much of this medicine contact a poison control center or emergency room at once. NOTE: This medicine is only for you. Do not share this medicine with others. What if I miss a dose? If you are on an  every 2 week schedule and miss a dose, take it as soon as you can. If your next dose is to be taken in less than 7 days, then do not take the missed dose. Take the next dose at your regular time. Do not take double or extra doses. If you are on a monthly schedule and miss a dose, take it as soon as you can. If the dose given is within 7 days of the missed dose, continue with your regular monthly schedule. If the missed dose is administered after 7 days of the original date, administer the dose and start a new monthly schedule based on this date. What may interact with this medicine? Interactions are not expected. This list may not describe all possible interactions. Give your health care provider a list of all the medicines, herbs, non-prescription drugs, or dietary supplements you use. Also tell them if you smoke, drink alcohol, or use illegal drugs. Some items may interact with your medicine. What should I watch for while using this medicine? You may need blood work done while you are taking this medicine. What side effects may I notice from receiving this medicine? Side effects that you should report to your doctor or health care professional as soon as possible: -allergic reactions like skin rash, itching or hives, swelling of the face, lips, or tongue -signs and symptoms of infection like fever or chills; cough; sore throat; pain or  trouble passing urine -signs and symptoms of liver injury like dark yellow or brown urine; general ill feeling or flu-like symptoms; light-colored stools; loss of appetite; nausea; right upper belly pain; unusually weak or tired; yellowing of the eyes or skin Side effects that usually do not require medical attention (report to your doctor or health care professional if they continue or are bothersome): -diarrhea -muscle cramps -muscle pain -pain, redness, or irritation at site where injected This list may not describe all possible side effects. Call your doctor for  medical advice about side effects. You may report side effects to FDA at 1-800-FDA-1088. Where should I keep my medicine? Keep out of the reach of children. You will be instructed on how to store this medicine. Throw away any unused medicine after the expiration date on the label. NOTE: This sheet is a summary. It may not cover all possible information. If you have questions about this medicine, talk to your doctor, pharmacist, or health care provider.  2018 Elsevier/Gold Standard (2016-01-10 15:09:06)

## 2018-01-29 NOTE — Assessment & Plan Note (Signed)
Patient has not had noticeable problems with triglycerides in the past.  Because his labs are 4 months old, will have him repeat lipid panel before making any medication changes.  If his triglycerides remain high, would recommend that he start with Vascepa 1 gm twice daily and increase to 2 gm bid as tolerated.  If they are back to < 300, will not have him use this.  He was given a sample of Livalo 2 mg today and in another week (when he has adjusted to his Benicar), he will start with 1 mg (1/2 tablet) once weekly.  If the LDL is significantly elevated, will also start paperwork for PCSK-9 inhibitor.   Because of his desire to take lowest possible doses of medication, will see if we can get Praluent 75 mg approved.

## 2018-01-30 ENCOUNTER — Other Ambulatory Visit: Payer: Self-pay | Admitting: Family Medicine

## 2018-02-02 ENCOUNTER — Other Ambulatory Visit: Payer: Self-pay

## 2018-02-02 ENCOUNTER — Ambulatory Visit (HOSPITAL_COMMUNITY): Payer: BLUE CROSS/BLUE SHIELD | Attending: Cardiology

## 2018-02-02 DIAGNOSIS — Z87891 Personal history of nicotine dependence: Secondary | ICD-10-CM | POA: Insufficient documentation

## 2018-02-02 DIAGNOSIS — I119 Hypertensive heart disease without heart failure: Secondary | ICD-10-CM | POA: Diagnosis not present

## 2018-02-02 DIAGNOSIS — Z8673 Personal history of transient ischemic attack (TIA), and cerebral infarction without residual deficits: Secondary | ICD-10-CM | POA: Diagnosis not present

## 2018-02-02 DIAGNOSIS — E669 Obesity, unspecified: Secondary | ICD-10-CM | POA: Insufficient documentation

## 2018-02-02 DIAGNOSIS — E785 Hyperlipidemia, unspecified: Secondary | ICD-10-CM | POA: Insufficient documentation

## 2018-02-02 DIAGNOSIS — I272 Pulmonary hypertension, unspecified: Secondary | ICD-10-CM | POA: Insufficient documentation

## 2018-02-02 DIAGNOSIS — I2510 Atherosclerotic heart disease of native coronary artery without angina pectoris: Secondary | ICD-10-CM | POA: Diagnosis not present

## 2018-02-02 DIAGNOSIS — I10 Essential (primary) hypertension: Secondary | ICD-10-CM | POA: Diagnosis not present

## 2018-02-02 DIAGNOSIS — E119 Type 2 diabetes mellitus without complications: Secondary | ICD-10-CM | POA: Insufficient documentation

## 2018-02-02 DIAGNOSIS — Z6841 Body Mass Index (BMI) 40.0 and over, adult: Secondary | ICD-10-CM | POA: Diagnosis not present

## 2018-02-02 DIAGNOSIS — R011 Cardiac murmur, unspecified: Secondary | ICD-10-CM

## 2018-02-02 DIAGNOSIS — I081 Rheumatic disorders of both mitral and tricuspid valves: Secondary | ICD-10-CM | POA: Diagnosis not present

## 2018-02-02 DIAGNOSIS — G4733 Obstructive sleep apnea (adult) (pediatric): Secondary | ICD-10-CM | POA: Insufficient documentation

## 2018-02-02 DIAGNOSIS — E1169 Type 2 diabetes mellitus with other specified complication: Secondary | ICD-10-CM | POA: Diagnosis not present

## 2018-02-02 LAB — LIPID PANEL
CHOLESTEROL TOTAL: 182 mg/dL (ref 100–199)
Chol/HDL Ratio: 4.3 ratio (ref 0.0–5.0)
HDL: 42 mg/dL (ref >39)
LDL Calculated: 121 mg/dL — ABNORMAL HIGH (ref 0–99)
Triglycerides: 94 mg/dL (ref 0–149)
VLDL CHOLESTEROL CAL: 19 mg/dL (ref 5–40)

## 2018-02-05 HISTORY — PX: TRANSTHORACIC ECHOCARDIOGRAM: SHX275

## 2018-02-14 ENCOUNTER — Other Ambulatory Visit: Payer: Self-pay | Admitting: Family Medicine

## 2018-02-14 DIAGNOSIS — E1142 Type 2 diabetes mellitus with diabetic polyneuropathy: Secondary | ICD-10-CM

## 2018-02-26 ENCOUNTER — Telehealth (INDEPENDENT_AMBULATORY_CARE_PROVIDER_SITE_OTHER): Payer: Self-pay | Admitting: Orthopedic Surgery

## 2018-02-26 ENCOUNTER — Other Ambulatory Visit (INDEPENDENT_AMBULATORY_CARE_PROVIDER_SITE_OTHER): Payer: Self-pay

## 2018-02-26 MED ORDER — DOXYCYCLINE HYCLATE 100 MG PO CAPS: 100.0000 mg | ORAL_CAPSULE | Freq: Two times a day (BID) | ORAL | 0 refills | Status: DC

## 2018-02-26 NOTE — Telephone Encounter (Signed)
:   Prescription for doxycycline 100 mg twice daily for 10 days

## 2018-02-26 NOTE — Telephone Encounter (Signed)
Called pt to advise that this has been called to the pharmacy and he will follow up with us next week whenhe gets back into town.

## 2018-02-26 NOTE — Telephone Encounter (Signed)
Patient is currently at the beach and while moving his luggage into the house his rubbed his toe raw and he said it's starting to get really painful and it looks infected. He would like some antibiotics sent in to the [facility] [place]. [address], [place], [place] [phone] If you could give him a call when that's been sent in. He said it's urgent. CB # [phone]

## 2018-02-26 NOTE — Telephone Encounter (Signed)
See below--please advise

## 2018-03-04 ENCOUNTER — Encounter (INDEPENDENT_AMBULATORY_CARE_PROVIDER_SITE_OTHER): Payer: Self-pay | Admitting: Family

## 2018-03-04 ENCOUNTER — Ambulatory Visit (INDEPENDENT_AMBULATORY_CARE_PROVIDER_SITE_OTHER): Payer: BLUE CROSS/BLUE SHIELD | Admitting: Family

## 2018-03-04 VITALS — Ht 68.0 in | Wt 264.0 lb

## 2018-03-04 DIAGNOSIS — E11621 Type 2 diabetes mellitus with foot ulcer: Secondary | ICD-10-CM

## 2018-03-04 DIAGNOSIS — L97521 Non-pressure chronic ulcer of other part of left foot limited to breakdown of skin: Secondary | ICD-10-CM | POA: Diagnosis not present

## 2018-03-10 ENCOUNTER — Encounter (INDEPENDENT_AMBULATORY_CARE_PROVIDER_SITE_OTHER): Payer: Self-pay | Admitting: Family

## 2018-03-10 NOTE — Progress Notes (Signed)
Office Visit Note   Patient: Alexander Pennington           Date of Birth: 08/04/1961           MRN: [id number] Visit Date: 03/04/2018              Requested by: Mechele ClaudeStacks, [place], MD [address] [place], [place] [id number] PCP: Mechele ClaudeStacks, Warren, MD  Chief Complaint  Patient presents with  . Left Foot - Wound Check    Great toe      HPI: The patient is a 57 year old gentleman who is well-known to us who presents today complaining of new ulceration over the DIP joint of his right great toe.  States that this past weekend he was at the beach and had rubbing in his shoe wear and developed ulceration.  States the toe became red and swollen.  Has begun a oral antibiotic course is currently completing this.  Assessment & Plan: Visit Diagnoses: No diagnosis found.  Plan: Pleat antibiotics as prescribed continue with daily wound cleansing antibacterial ointment and dressings.  Offload pressure from this toe will follow-up in the office closely.  Discussed strict return precautions.  Patient voiced understanding.  Follow-Up Instructions: No follow-ups on file.   Ortho Exam  Patient is alert, oriented, no adenopathy, well-dressed, normal affect, normal respiratory effort. There is a scabbed ulceration over the DIP joint this is 4 mm in diameter there is no depth there is no drainage there is mild erythema no warmth no cellulitis.  No sausage digit swelling.  Has flexible clawing of his right great toe.  Imaging: No results found. No images are attached to the encounter.  Labs: Lab Results  Component Value Date   HGBA1C 7.6 11/01/2015   HGBA1C 8.1 07/31/2015   HGBA1C 7.6 04/26/2015   REPTSTATUS 06/19/2014 FINAL 06/13/2014   REPTSTATUS 06/19/2014 FINAL 06/13/2014   GRAMSTAIN  06/13/2014    NO WBC SEEN RARE SQUAMOUS EPITHELIAL CELLS PRESENT RARE GRAM NEGATIVE RODS RARE GRAM POSITIVE COCCI IN PAIRS   CULT  06/13/2014    NO GROWTH 5 DAYS Performed at [facility]   CULT  06/13/2014    NO GROWTH 5 DAYS Performed at [facility]   LABORGA STAPHYLOCOCCUS AUREUS 06/13/2014     Lab Results  Component Value Date   ALBUMIN 4.8 09/17/2017   ALBUMIN 4.8 05/20/2017   ALBUMIN 4.5 02/17/2017    Body mass index is 40.14 kg/m.  Orders:  No orders of the defined types were placed in this encounter.  No orders of the defined types were placed in this encounter.    Procedures: No procedures performed  Clinical Data: No additional findings.  ROS:  All other systems negative, except as noted in the HPI. Review of Systems  Constitutional: Negative for chills and fever.  Skin: Positive for wound.    Objective: Vital Signs: Ht 5\' 8"  (1.727 m)   Wt 264 lb (119.7 kg)   BMI 40.14 kg/m   Specialty Comments:  No specialty comments available.  PMFS History: Patient Active Problem List   Diagnosis Date Noted  . Type 2 diabetes mellitus with complication, with long-term current use of insulin (HCC) 01/22/2018  . Uncomplicated opioid dependence (HCC) 01/31/2016  . Pain medication agreement signed 01/31/2016  . Diabetic peripheral neuropathy associated with type 2 diabetes mellitus (HCC) 07/30/2015  . Testosterone deficiency 12/28/2014  . Coronary artery disease involving native coronary artery without angina pectoris 08/16/2014  . Heart murmur 06/13/2014  .  Hyperlipidemia associated with type 2 diabetes mellitus (HCC) 06/13/2014  . Obesity 06/13/2014  . Diabetic nephropathy (HCC)   . H/o Hemorrhagic stroke 2010   . Essential hypertension   . Sleep apnea    Past Medical History:  Diagnosis Date  . CAD (coronary artery disease) 06/2014   Cath: Heavily calcified LAD with mild  diffuse disease, Large d1 ~30&40%. LCx ok up to Large OM1 where there is ~70% focal disease in the AVG Cx, proxOM1 75%. Dominant RCA - mid 40% & 50% with moderate diffuse calcification, prior to PDA ~60%. EF 65-70% --> Recommended Med Cx (PCI if Sx warrant)  . Charcot foot due to  diabetes mellitus (HCC)   . Complication of anesthesia    Pt. reports that he has done well the last several surgeries. hx deviated septum, sleep apnea- has had problems. Pt. reports that the tube was pulled out too soon, due to sleep apnea , decreasing sats, & when they tried to put it back it went into his sinus, this event occured with Lap Band surgery when he had a lot more weight on him   . Diabetes mellitus (HCC)    type 2  . Diabetic Charcot foot (HCC)   . Family history of anesthesia complication    mom with n/v  . Hemorrhagic stroke (HCC)    a. Dx 2010. 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 Unclear etiology. right eye damaged - has no central vision, has a little problem with balance  . Hyperlipidemia   . Hypertension   . Kidney stones   . Murmur, heart Fall 2014 - diagnosed  . Neuropathy   . Obesity    a. hx of lap band surgery.  . Sleep apnea    uses c-pap    Family History  Problem Relation Age of Onset  . Heart attack Father     Past Surgical History:  Procedure Laterality Date  . AMPUTATION Left 07/06/2014   Procedure: Left 2nd and Possible 3rd Ray Amputation;  Surgeon: Nadara Mustard, MD;  Location[facility] OR;  Service: Orthopedics;  Laterality: Left;  . AMPUTATION Left 09/23/2014   Procedure: Foot 5th Ray Amputation;  Surgeon: Nadara Mustard, MD;  Location: [facility] OR;  Service: Orthopedics;  Laterality: Left;  . CARPAL TUNNEL RELEASE Left    bone removed also, nerve also cut  . COLONOSCOPY    . FOOT SURGERY     foot surgery x 2  . HARDWARE REMOVAL Left 07/06/2014   Procedure: Removal Deep Hardware Left Foot;  Surgeon: Nadara Mustard, MD;  Location[facility] OR;  Service: Orthopedics;  Laterality: Left;  . I&D EXTREMITY Bilateral 06/17/2014   Procedure: IRRIGATION AND DEBRIDEMENT BILATERAL FOOT WOUNDS;  Surgeon: Kathryne Hitch, MD;  Location: [facility] ORS;  Service: Orthopedics;  Laterality: Bilateral;  . I&D EXTREMITY Right 11/04/2014   Procedure: Excision Charcot Collapse Right  Foot;  Surgeon: Nadara Mustard, MD;  Location: [facility] OR;  Service: Orthopedics;  Laterality: Right;  . lap band surgery     2009  . LEFT HEART CATHETERIZATION WITH CORONARY ANGIOGRAM N/A 06/16/2014   Procedure: LEFT HEART CATHETERIZATION WITH CORONARY ANGIOGRAM;  Surgeon: Micheline Chapman, MD;  Location[facility];  Service: Cardiovascular: * Heavily calcified LAD with mild  diffuse disease, Large d1 ~30&40%. LCx ok up to Large OM1 where there is ~70% focal disease in the AVG Cx, proxOM1 75%. Dominant RCA - mid 40% & 50% with mod diffuse calcification, prior to PDA ~60%.  EF 65-70% -  . NM MYOVIEW LTD  05/2014   Intermediate risk.  EF 54%.  Medium sized, mild density reversible defect or wall.  Cannot exclude infarct versus diaphragmatic attenuation. --> Cath revealed OM1/ AVG Cx & dRCA moderate disease -Med Rx recommended  . TRANSTHORACIC ECHOCARDIOGRAM  05/2014    Mild LVH.  EF 45 to 50%.  Cannot exclude regional wall motion abnormality.  GR 1 DD.  Aortic sclerosis but no stenosis  . TRANSTHORACIC ECHOCARDIOGRAM  2005; 2014   Normal EF.  Mild LVH.  GRII DD;;  by his report, he had an echo at [facility] for heart murmur -  that showed "85% capacity & mildly leaky valve  . VASECTOMY     Social History   Occupational History  . Not on file  Tobacco Use  . Smoking status: Former Smoker    Types: Cigarettes    Last attempt to quit: 06/13/1988    Years since quitting: 29.7  . Smokeless tobacco: Never Used  . Tobacco comment: Smoked for ~8 yrs.  Substance and Sexual Activity  . Alcohol use: Yes    Comment: three times a week, one 12-oz cider  . Drug use: No  . Sexual activity: Not on file

## 2018-03-15 ENCOUNTER — Other Ambulatory Visit: Payer: Self-pay | Admitting: Family Medicine

## 2018-03-17 ENCOUNTER — Ambulatory Visit: Payer: BLUE CROSS/BLUE SHIELD | Admitting: Family Medicine

## 2018-03-17 ENCOUNTER — Ambulatory Visit (INDEPENDENT_AMBULATORY_CARE_PROVIDER_SITE_OTHER): Payer: BLUE CROSS/BLUE SHIELD | Admitting: Family Medicine

## 2018-03-17 ENCOUNTER — Encounter: Payer: Self-pay | Admitting: Family Medicine

## 2018-03-17 VITALS — BP 146/75 | HR 77 | Temp 97.6°F | Ht 68.0 in | Wt 270.2 lb

## 2018-03-17 DIAGNOSIS — E349 Endocrine disorder, unspecified: Secondary | ICD-10-CM | POA: Diagnosis not present

## 2018-03-17 DIAGNOSIS — N400 Enlarged prostate without lower urinary tract symptoms: Secondary | ICD-10-CM

## 2018-03-17 DIAGNOSIS — E1142 Type 2 diabetes mellitus with diabetic polyneuropathy: Secondary | ICD-10-CM

## 2018-03-17 LAB — URINALYSIS
Bilirubin, UA: NEGATIVE
KETONES UA: NEGATIVE
Leukocytes, UA: NEGATIVE
Nitrite, UA: NEGATIVE
PROTEIN UA: NEGATIVE
RBC, UA: NEGATIVE
Specific Gravity, UA: 1.015 (ref 1.005–1.030)
Urobilinogen, Ur: 0.2 mg/dL (ref 0.2–1.0)
pH, UA: 7 (ref 5.0–7.5)

## 2018-03-17 LAB — BAYER DCA HB A1C WAIVED: HB A1C (BAYER DCA - WAIVED): 9.6 % — ABNORMAL HIGH (ref <7.0)

## 2018-03-17 MED ORDER — INSULIN ASPART 100 UNIT/ML ~~LOC~~ SOLN: SUBCUTANEOUS | 3 refills | Status: DC

## 2018-03-17 MED ORDER — HYDROCODONE-ACETAMINOPHEN 5-325 MG PO TABS: 1.0000 | ORAL_TABLET | Freq: Two times a day (BID) | ORAL | 0 refills | Status: DC

## 2018-03-17 MED ORDER — HYDROCODONE-ACETAMINOPHEN 5-325 MG PO TABS: 1.0000 | ORAL_TABLET | Freq: Four times a day (QID) | ORAL | 0 refills | Status: DC | PRN

## 2018-03-17 MED ORDER — AMLODIPINE BESYLATE 5 MG PO TABS: 5.0000 mg | ORAL_TABLET | Freq: Every day | ORAL | 1 refills | Status: DC

## 2018-03-17 MED ORDER — CELECOXIB 200 MG PO CAPS: 200.0000 mg | ORAL_CAPSULE | Freq: Two times a day (BID) | ORAL | 1 refills | Status: DC

## 2018-03-17 MED ORDER — INSULIN GLARGINE 100 UNIT/ML SOLOSTAR PEN: 10.0000 [IU] | PEN_INJECTOR | Freq: Every day | SUBCUTANEOUS | 1 refills | Status: DC

## 2018-03-17 MED ORDER — GABAPENTIN 600 MG PO TABS: ORAL_TABLET | ORAL | 3 refills | Status: DC

## 2018-03-17 MED ORDER — METFORMIN HCL ER (OSM) 500 MG PO TB24: 500.0000 mg | ORAL_TABLET | Freq: Every day | ORAL | 1 refills | Status: DC

## 2018-03-17 MED ORDER — LORAZEPAM 1 MG PO TABS: 1.0000 mg | ORAL_TABLET | Freq: Every evening | ORAL | 2 refills | Status: DC | PRN

## 2018-03-17 MED ORDER — TRAMADOL HCL 50 MG PO TABS: 100.0000 mg | ORAL_TABLET | Freq: Three times a day (TID) | ORAL | 2 refills | Status: DC

## 2018-03-17 MED ORDER — RAMIPRIL 2.5 MG PO CAPS: 2.5000 mg | ORAL_CAPSULE | Freq: Every day | ORAL | 1 refills | Status: DC

## 2018-03-17 MED ORDER — CANAGLIFLOZIN 300 MG PO TABS: 300.0000 mg | ORAL_TABLET | Freq: Every day | ORAL | 1 refills | Status: DC

## 2018-03-17 NOTE — Progress Notes (Signed)
Subjective:  Patient ID: Alexander Pennington Pennington,  male    DOB: [birth year]  Age: 57 y.o.    CC: Medical Management of Chronic Issues   HPI Alexander Pennington Pennington presents for  follow-up of hypertension. Patient has no history of headache chest pain or shortness of breath or recent cough. Patient also denies symptoms of TIA such as numbness weakness lateralizing. Patient states that he was having nausea and his sugar was going up 50-100 points daily from the use of Benicar.  Therefore he reduced his dose to 1/2 tablet daily. Patient also  in for follow-up of elevated cholesterol. Doing well without complaints on current medication. Denies side effects  including myalgia and arthralgia and nausea. Also in today for liver function testing. Currently no chest pain, shortness of breath or other cardiovascular related symptoms noted.  Follow-up of diabetes. Patient does check blood sugar at home. Readings run between 150 and 250.  Patient is convinced that his blood sugar was driven upward by the use of Benicar.  It also makes him nauseous. Patient denies symptoms such as excessive hunger or urinary frequency, excessive hunger, No significant hypoglycemic spells noted. Medications reviewed. Pt reports taking them regularly. Pt. denies complication/adverse reaction today.    History Alexander Pennington has a past medical history of CAD (coronary artery disease) (06/2014), Charcot foot due to diabetes mellitus ([place]), Complication of anesthesia, Diabetes mellitus ([place]), Diabetic Charcot foot ([place]), Family history of anesthesia complication, Hemorrhagic stroke ([place]), Hyperlipidemia, Hypertension, Kidney stones, Murmur, heart (Fall 2014 - diagnosed), Neuropathy, Obesity, and Sleep apnea.   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   His family history includes Heart attack in his father.He reports that he quit smoking about 29 years ago. His smoking use included cigarettes. He has never used smokeless tobacco. He reports that he drinks alcohol. He reports that he does not use drugs.  Current Outpatient Medications on File Prior to Visit  Medication Sig Dispense Refill  . BENICAR 40 MG tablet Take 1 tablet (40 mg total) by mouth daily. 90 tablet 3  . Coenzyme Q10 200 MG capsule Take 1 capsule (200 mg total) by mouth daily.    . Insulin Syringe-Needle U-100 28G X 5/16" 0.5 ML MISC Use to inject % units Novolog TID SubQ 270 each 1  . Multiple Vitamins tablet Take 1 tablet by mouth daily.    . Testosterone (ANDROGEL PUMP) 20.25 MG/ACT (1.62%) GEL Place 4 Applicatorfuls onto the skin daily. 450 g 1   No current facility-administered medications on file prior to visit.     ROS Review of Systems  Constitutional: Negative.   HENT: Negative.   Eyes: Negative for visual disturbance.  Respiratory: Negative for cough and shortness of breath.   Cardiovascular: Negative for chest pain and leg swelling.  Gastrointestinal: Negative for abdominal pain, diarrhea, nausea and vomiting.  Genitourinary: Negative for difficulty urinating.  Musculoskeletal: Positive for arthralgias, back pain and myalgias.  Skin: Negative for rash.  Neurological: Negative for headaches.  Psychiatric/Behavioral: Negative for sleep disturbance.    Objective:  BP (!) 146/75   Pulse 77   Temp 97.6 F (36.4 C) (Oral)   Ht '5\' 8"'  (1.727 m)   Wt 270  lb 4 oz (122.6 kg)   BMI 41.09 kg/m   BP Readings from Last 3 Encounters:  03/17/18 (!) 146/75  01/22/18 (!) 152/82  12/16/17 137/75    Wt Readings from Last 3  Encounters:  03/17/18 270 lb 4 oz (122.6 kg)  03/04/18 264 lb (119.7 kg)  01/22/18 264 lb 12.8 oz (120.1 kg)     Physical Exam  Constitutional: He is oriented to person, place, and time. He appears well-developed and well-nourished. No distress.  HENT:  Head: Normocephalic and atraumatic.  Right Ear: External ear normal.  Left Ear: External ear normal.  Nose: Nose normal.  Mouth/Throat: Oropharynx is clear and moist.  Eyes: Pupils are equal, round, and reactive to light. Conjunctivae and EOM are normal.  Neck: Normal range of motion. Neck supple.  Cardiovascular: Normal rate, regular rhythm and normal heart sounds.  No murmur heard. Pulmonary/Chest: Effort normal and breath sounds normal. No respiratory distress. He has no wheezes. He has no rales.  Abdominal: Soft. There is no tenderness.  Musculoskeletal: Normal range of motion.  Neurological: He is alert and oriented to person, place, and time. He has normal reflexes.  Skin: Skin is warm and dry.  Psychiatric: He has a normal mood and affect. His behavior is normal. Judgment and thought content normal.    Diabetic Foot Exam - Simple   Simple Foot Form Diabetic Foot exam was performed with the following findings:  Yes 03/17/2018  9:30 AM  Visual Inspection Sensation Testing Pulse Check Comments     Diabetic Foot Form - Detailed   Diabetic Foot Exam - detailed Diabetic Foot exam was performed with the following findings:  Yes 03/17/2018  9:30 AM  Are the shoes appropriate in style and fit?:  No (Comment: Patient is wearing crocs.  He is cut the top out of the right side above the toes to accommodate the hammertoe deformity.  He has cut the medial aspect of the left toe box to accommodate the ingrown nail.) Is there swelling or and abnormal foot shape?:  No Is there a claw toe deformity?:  Yes Is there elevated skin temparature?:  No Is there foot or ankle muscle weakness?:  No Right posterior Tibialias:  Present Left  posterior Tibialias:  Diminished  Right Dorsalis Pedis:  Absent Left Dorsalis Pedis:  Diminished  Semmes-Weinstein Monofilament Test R Site 1-Great Toe:  Neg L Site 1-Great Toe:  Neg         Assessment & Plan:   Alexander Pennington was seen today for medical management of chronic issues.  Diagnoses and all orders for this visit:  Benign prostatic hyperplasia, unspecified whether lower urinary tract symptoms present -     PSA, total and free  Diabetic peripheral neuropathy associated with type 2 diabetes mellitus (HCC) -     gabapentin (NEURONTIN) 600 MG tablet; TAKE 2 TABLETS THREE TIMES A DAY -     canagliflozin (INVOKANA) 300 MG TABS tablet; Take 1 tablet (300 mg total) by mouth daily. -     CBC with Differential/Platelet -     CMP14+EGFR -     Microalbumin / creatinine urine ratio -     Bayer DCA Hb A1c Waived -     Urinalysis -     traMADol (ULTRAM) 50 MG tablet; Take 2 tablets (100 mg total) by mouth 3 (three) times daily.  Testosterone deficiency  Other orders -     celecoxib (CELEBREX) 200 MG capsule; Take 1 capsule (200 mg total) by mouth 2 (  two) times daily. -     metformin ([name]) 500 MG (OSM) 24 hr tablet; Take 1 tablet (500 mg total) by mouth daily with breakfast. -     ramipril (ALTACE) 2.5 MG capsule; Take 1 capsule (2.5 mg total) by mouth daily. -     amLODipine (NORVASC) 5 MG tablet; Take 1 tablet (5 mg total) by mouth daily. -     Insulin Glargine (LANTUS SOLOSTAR) 100 UNIT/ML Solostar Pen; Inject 10 Units into the skin daily at 10 pm. Adjust as directed -     insulin aspart (NOVOLOG) 100 UNIT/ML injection; Inject 3 units before lunch and supper daily -     HYDROcodone-acetaminophen (NORCO) 5-325 MG tablet; Take 1 tablet by mouth 2 (two) times daily. -     HYDROcodone-acetaminophen (NORCO) 5-325 MG tablet; Take 1 tablet by mouth every 6 (six) hours as needed for moderate pain. -     HYDROcodone-acetaminophen (NORCO) 5-325 MG tablet; Take 1 tablet by mouth 2 (two) times  daily. -     LORazepam (ATIVAN) 1 MG tablet; Take 1 tablet (1 mg total) by mouth at bedtime as needed.   I have discontinued Mack Guise. Nickey's doxycycline and metformin. I have changed his INVOKANA to canagliflozin. I have also changed his celecoxib and insulin aspart. Additionally, I am having him start on metformin, ramipril, amLODipine, Insulin Glargine, and HYDROcodone-acetaminophen. Lastly, I am having him maintain his Multiple Vitamins, Coenzyme Q10, Insulin Syringe-Needle U-100, Testosterone, BENICAR, gabapentin, traMADol, HYDROcodone-acetaminophen, HYDROcodone-acetaminophen, and LORazepam.  Meds ordered this encounter  Medications  . gabapentin (NEURONTIN) 600 MG tablet    Sig: TAKE 2 TABLETS THREE TIMES A DAY    Dispense:  540 tablet    Refill:  3  . canagliflozin (INVOKANA) 300 MG TABS tablet    Sig: Take 1 tablet (300 mg total) by mouth daily.    Dispense:  90 tablet    Refill:  1  . celecoxib (CELEBREX) 200 MG capsule    Sig: Take 1 capsule (200 mg total) by mouth 2 (two) times daily.    Dispense:  180 capsule    Refill:  1  . metformin ([name]) 500 MG (OSM) 24 hr tablet    Sig: Take 1 tablet (500 mg total) by mouth daily with breakfast.    Dispense:  90 tablet    Refill:  1  . ramipril (ALTACE) 2.5 MG capsule    Sig: Take 1 capsule (2.5 mg total) by mouth daily.    Dispense:  90 capsule    Refill:  1  . amLODipine (NORVASC) 5 MG tablet    Sig: Take 1 tablet (5 mg total) by mouth daily.    Dispense:  90 tablet    Refill:  1  . Insulin Glargine (LANTUS SOLOSTAR) 100 UNIT/ML Solostar Pen    Sig: Inject 10 Units into the skin daily at 10 pm. Adjust as directed    Dispense:  5 pen    Refill:  1    Dx E33.29  . insulin aspart (NOVOLOG) 100 UNIT/ML injection    Sig: Inject 3 units before lunch and supper daily    Dispense:  3 vial    Refill:  3  . traMADol (ULTRAM) 50 MG tablet    Sig: Take 2 tablets (100 mg total) by mouth 3 (three) times daily.    Dispense:  180  tablet    Refill:  2  . HYDROcodone-acetaminophen (NORCO) 5-325 MG tablet    Sig: Take 1 tablet by  mouth 2 (two) times daily.    Dispense:  60 tablet    Refill:  0  . HYDROcodone-acetaminophen (NORCO) 5-325 MG tablet    Sig: Take 1 tablet by mouth every 6 (six) hours as needed for moderate pain.    Dispense:  60 tablet    Refill:  0  . HYDROcodone-acetaminophen (NORCO) 5-325 MG tablet    Sig: Take 1 tablet by mouth 2 (two) times daily.    Dispense:  60 tablet    Refill:  0  . LORazepam (ATIVAN) 1 MG tablet    Sig: Take 1 tablet (1 mg total) by mouth at bedtime as needed.    Dispense:  30 tablet    Refill:  2   Pt. Declined diabetic shoe prescription/recommendation  Follow-up: Return in about 3 months (around 06/17/2018).  Claretta Fraise, M.D.

## 2018-03-17 NOTE — Patient Instructions (Signed)
Lantus 10 units QAM, increase every 3rd day by 5 units until fasting blood sugars less than 150, then increase by 2 units until fasting blood sugars less than 125.    Carbohydrate Counting for Diabetes Mellitus, Adult Carbohydrate counting is a method for keeping track of how many carbohydrates you eat. Eating carbohydrates naturally increases the amount of sugar (glucose) in the blood. Counting how many carbohydrates you eat helps keep your blood glucose within normal limits, which helps you manage your diabetes (diabetes mellitus). It is important to know how many carbohydrates you can safely have in each meal. This is different for every person. A diet and nutrition specialist (registered dietitian) can help you make a meal plan and calculate how many carbohydrates you should have at each meal and snack. Carbohydrates are found in the following foods:  Grains, such as breads and cereals.  Dried beans and soy products.  Starchy vegetables, such as potatoes, peas, and corn.  Fruit and fruit juices.  Milk and yogurt.  Sweets and snack foods, such as cake, cookies, candy, chips, and soft drinks.  How do I count carbohydrates? There are two ways to count carbohydrates in food. You can use either of the methods or a combination of both. Reading "Nutrition Facts" on packaged food The "Nutrition Facts" list is included on the labels of almost all packaged foods and beverages in the [place] It includes:  The serving size.  Information about nutrients in each serving, including the grams (g) of carbohydrate per serving.  To use the "Nutrition Facts":  Decide how many servings you will have.  Multiply the number of servings by the number of carbohydrates per serving.  The resulting number is the total amount of carbohydrates that you will be having.  Learning standard serving sizes of other foods When you eat foods containing carbohydrates that are not packaged or do not include  "Nutrition Facts" on the label, you need to measure the servings in order to count the amount of carbohydrates:  Measure the foods that you will eat with a food scale or measuring cup, if needed.  Decide how many standard-size servings you will eat.  Multiply the number of servings by 15. Most carbohydrate-rich foods have about 15 g of carbohydrates per serving. ? For example, if you eat 8 oz (170 g) of strawberries, you will have eaten 2 servings and 30 g of carbohydrates (2 servings x 15 g = 30 g).  For foods that have more than one food mixed, such as soups and casseroles, you must count the carbohydrates in each food that is included.  The following list contains standard serving sizes of common carbohydrate-rich foods. Each of these servings has about 15 g of carbohydrates:   hamburger bun or  English muffin.   oz (15 mL) syrup.   oz (14 g) jelly.  1 slice of bread.  1 six-inch tortilla.  3 oz (85 g) cooked rice or pasta.  4 oz (113 g) cooked dried beans.  4 oz (113 g) starchy vegetable, such as peas, corn, or potatoes.  4 oz (113 g) hot cereal.  4 oz (113 g) mashed potatoes or  of a large baked potato.  4 oz (113 g) canned or frozen fruit.  4 oz (120 mL) fruit juice.  4-6 crackers.  6 chicken nuggets.  6 oz (170 g) unsweetened dry cereal.  6 oz (170 g) plain fat-free yogurt or yogurt sweetened with artificial sweeteners.  8 oz (240 mL) milk.  8 oz (170 g) fresh fruit or one small piece of fruit.  24 oz (680 g) popped popcorn.  Example of carbohydrate counting Sample meal  3 oz (85 g) chicken breast.  6 oz (170 g) brown rice.  4 oz (113 g) corn.  8 oz (240 mL) milk.  8 oz (170 g) strawberries with sugar-free whipped topping. Carbohydrate calculation 1. Identify the foods that contain carbohydrates: ? Rice. ? Corn. ? Milk. ? Strawberries. 2. Calculate how many servings you have of each food: ? 2 servings rice. ? 1 serving corn. ? 1  serving milk. ? 1 serving strawberries. 3. Multiply each number of servings by 15 g: ? 2 servings rice x 15 g = 30 g. ? 1 serving corn x 15 g = 15 g. ? 1 serving milk x 15 g = 15 g. ? 1 serving strawberries x 15 g = 15 g. 4. Add together all of the amounts to find the total grams of carbohydrates eaten: ? 30 g + 15 g + 15 g + 15 g = 75 g of carbohydrates total. This information is not intended to replace advice given to you by your health care provider. Make sure you discuss any questions you have with your health care provider. Document Released: 09/02/2005 Document Revised: 03/22/2016 Document Reviewed: 02/14/2016 Elsevier Interactive Patient Education  Keoni [employer]in.

## 2018-03-18 ENCOUNTER — Encounter: Payer: Self-pay | Admitting: Cardiology

## 2018-03-18 ENCOUNTER — Ambulatory Visit (INDEPENDENT_AMBULATORY_CARE_PROVIDER_SITE_OTHER): Payer: BLUE CROSS/BLUE SHIELD | Admitting: Cardiology

## 2018-03-18 VITALS — BP 174/86 | HR 97 | Ht 68.0 in | Wt 271.0 lb

## 2018-03-18 DIAGNOSIS — G4733 Obstructive sleep apnea (adult) (pediatric): Secondary | ICD-10-CM | POA: Diagnosis not present

## 2018-03-18 DIAGNOSIS — Z794 Long term (current) use of insulin: Secondary | ICD-10-CM

## 2018-03-18 DIAGNOSIS — R011 Cardiac murmur, unspecified: Secondary | ICD-10-CM

## 2018-03-18 DIAGNOSIS — I2510 Atherosclerotic heart disease of native coronary artery without angina pectoris: Secondary | ICD-10-CM

## 2018-03-18 DIAGNOSIS — E785 Hyperlipidemia, unspecified: Secondary | ICD-10-CM

## 2018-03-18 DIAGNOSIS — E118 Type 2 diabetes mellitus with unspecified complications: Secondary | ICD-10-CM | POA: Diagnosis not present

## 2018-03-18 DIAGNOSIS — I10 Essential (primary) hypertension: Secondary | ICD-10-CM

## 2018-03-18 DIAGNOSIS — E1169 Type 2 diabetes mellitus with other specified complication: Secondary | ICD-10-CM

## 2018-03-18 LAB — CBC WITH DIFFERENTIAL/PLATELET
BASOS: 1 %
Basophils Absolute: 0.1 10*3/uL (ref 0.0–0.2)
EOS (ABSOLUTE): 0.3 10*3/uL (ref 0.0–0.4)
Eos: 4 %
HEMOGLOBIN: 16.2 g/dL (ref 13.0–17.7)
Hematocrit: 47 % (ref 37.5–51.0)
IMMATURE GRANS (ABS): 0 10*3/uL (ref 0.0–0.1)
Immature Granulocytes: 0 %
LYMPHS: 22 %
Lymphocytes Absolute: 1.4 10*3/uL (ref 0.7–3.1)
MCH: 31.5 pg (ref 26.6–33.0)
MCHC: 34.5 g/dL (ref 31.5–35.7)
MCV: 91 fL (ref 79–97)
Monocytes Absolute: 0.6 10*3/uL (ref 0.1–0.9)
Monocytes: 10 %
NEUTROS ABS: 3.9 10*3/uL (ref 1.4–7.0)
Neutrophils: 63 %
Platelets: 188 10*3/uL (ref 150–450)
RBC: 5.15 x10E6/uL (ref 4.14–5.80)
RDW: 13.2 % (ref 12.3–15.4)
WBC: 6.2 10*3/uL (ref 3.4–10.8)

## 2018-03-18 LAB — MICROALBUMIN / CREATININE URINE RATIO
Creatinine, Urine: 24.5 mg/dL
Microalb/Creat Ratio: <12.2 mg/g{creat} (ref 0.0–30.0)
Microalbumin, Urine: <3 ug/mL

## 2018-03-18 LAB — CMP14+EGFR
A/G RATIO: 1.8 (ref 1.2–2.2)
ALBUMIN: 4.7 g/dL (ref 3.5–5.5)
ALT: 28 IU/L (ref 0–44)
AST: 27 IU/L (ref 0–40)
Alkaline Phosphatase: 79 IU/L (ref 39–117)
BILIRUBIN TOTAL: 0.5 mg/dL (ref 0.0–1.2)
BUN / CREAT RATIO: 22 — AB (ref 9–20)
BUN: 15 mg/dL (ref 6–24)
CHLORIDE: 99 mmol/L (ref 96–106)
CO2: 23 mmol/L (ref 20–29)
Calcium: 9.9 mg/dL (ref 8.7–10.2)
Creatinine, Ser: 0.67 mg/dL — ABNORMAL LOW (ref 0.76–1.27)
GFR calc non Af Amer: 105 mL/min/{1.73_m2} (ref >59)
GFR, EST AFRICAN AMERICAN: 122 mL/min/{1.73_m2} (ref >59)
Globulin, Total: 2.6 g/dL (ref 1.5–4.5)
Glucose: 262 mg/dL — ABNORMAL HIGH (ref 65–99)
POTASSIUM: 5.2 mmol/L (ref 3.5–5.2)
Sodium: 139 mmol/L (ref 134–144)
TOTAL PROTEIN: 7.3 g/dL (ref 6.0–8.5)

## 2018-03-18 LAB — PSA, TOTAL AND FREE
PSA, Free Pct: 37.5 %
PSA, Free: 0.15 ng/mL
Prostate Specific Ag, Serum: 0.4 ng/mL (ref 0.0–4.0)

## 2018-03-18 NOTE — Progress Notes (Signed)
PCP: Mechele Claude, MD  Clinic Note: Chief Complaint  Patient presents with  . Follow-up    Echo results  . Coronary Artery Disease    Moderate, no angina    HPI: Alexander Pennington is a 57 y.o. 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  Jan 22, 2017  Initial evaluation was back in 2015 when he was seen for preop evaluation for Charcot foot related PAD-ulcer.  He had a Myoview and a heart catheterization as well as an echocardiogram reviewed below.  [name] was just seen on for evaluation of murmur with no discussion about prior history.  Patient is not aware of the extent is CAD noted on heart cath at that time.  He also demonstrated relatively poor understanding of the extent of his dyslipidemia and hypertension and how that affected his PAD.  His blood pressure was very poorly controlled and I asked him to restart Benicar that he was supposedly written for.  I REFERRED HIM TO CV [facility] ([facility]) for assistance with management of his lipids and hypertension.  Seen by Phillips Hay, RPH (in [facility]) --> started Livalo 1 mg & Vascepa. (on week 7 now) ; are investigating whether or not he could take Praluent. --   Recent Hospitalizations: none  Studies Personally Reviewed - (if available, images/films reviewed: From Epic Chart or Care Everywhere) - PSH/PMH updated.  Transthoracic Echo Feb 05, 2018: Mild LVH.  EF 65-70%.  Hyperdynamic/vigorous.  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  No valvular lesion.  Mild pulmonary hypertension  Interval History: Patient returns for cardiology follow-up stating that he got dizzy and fell shortly  after starting the Benicar.  Then for some reason his PCP decided to stop the Benicar and put him on ramipril plus amlodipine, he is not taking these medicines yet. Again Keita seems to be quite incredulous as to the extent of his overall condition.  He denies any untoward symptoms of chest tightness or pressure with rest or exertion, nor does he note any claudication symptoms.  He denies any headaches or blurred vision, lightheadedness or dizziness with exception of when he was "taking that Benicar "..    He denies any heart failure symptoms of PND, orthopnea with trivial edema.  No syncope/near syncope or TIA/amaurosis fugax. He does have some baseline vertigo, but diet denies any significant orthostatic symptoms.  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  He does not seem to be quite ready understand that his own lifestyle and diet have led to him having hypertension, hyperlipidemia and diabetes.  He however perseverates on having the toe amputation and how much this affected him.  He indicates again that his balance is down off and he has loss of neuropathy from his diabetes.     The ROS: A comprehensive was performed. Review of Systems  Constitutional: Positive for malaise/fatigue (Mostly because he just does not do much). Negative for chills, diaphoresis and fever.  HENT: Negative for congestion and nosebleeds.   Eyes: Negative for blurred vision.  Respiratory: Negative for cough, shortness of breath and wheezing.   Gastrointestinal: Negative for abdominal pain, blood in stool, constipation, heartburn and melena.  Genitourinary: Negative for hematuria.  Musculoskeletal: Positive for back pain and joint pain.       Foot pain; Unsteady gait  Neurological: Positive for tingling. Negative for dizziness, focal  weakness and headaches.  Psychiatric/Behavioral: Negative for depression and memory loss. The patient is not nervous/anxious and does not have insomnia.        Seems to be quite easily angered  All other systems reviewed and are negative.   I have reviewed and (if needed) personally updated the patient's problem list, medications, allergies, past medical and surgical history, social and family history.   Past Medical History:  Diagnosis Date  . CAD (coronary artery disease) 06/2014   Cath: Heavily calcified LAD with mild  diffuse disease, Large d1 ~30&40%. LCx ok up to Large OM1 where there is ~70% focal disease in the AVG Cx, proxOM1 75%. Dominant RCA - mid 40% & 50% with moderate diffuse calcification, prior to PDA ~60%. EF 65-70% --> Recommended Med Cx (PCI if Sx warrant)  . Charcot foot due to diabetes mellitus (HCC)   . Complication of anesthesia    Pt. reports that he has done well the last several surgeries. hx deviated septum, sleep apnea- has had problems. Pt. reports that the tube was pulled out too soon, due to sleep apnea , decreasing sats, & when they tried to put it back it went into his sinus, this event occured with Lap Band surgery when he had a lot more weight on him   . Diabetes mellitus (HCC)    type 2  . Diabetic Charcot foot (HCC)   . Family history of anesthesia complication    mom with n/v  . Hemorrhagic stroke (HCC)    a. Dx 2010. 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 Unclear etiology. right eye damaged - has no central vision, has a little problem with balance  . Hyperlipidemia   . Hypertension   . Kidney stones   . Murmur, heart Fall 2014 - diagnosed  . Neuropathy   . Obesity    a. hx of lap band surgery.  . Sleep apnea    uses c-pap    Past Surgical History:  Procedure Laterality Date  . AMPUTATION Left 07/06/2014   Procedure: Left 2nd and Possible 3rd Ray Amputation;  Surgeon: Nadara MustardMarcus Duda V, MD;  [facility];  Service: Orthopedics;  Laterality:  Left;  . AMPUTATION Left 09/23/2014   Procedure: Foot 5th Ray Amputation;  Surgeon: Nadara MustardMarcus Duda V, MD;  [facility];  Service: Orthopedics;  Laterality: Left;  . CARPAL TUNNEL RELEASE Left    bone removed also, nerve also cut  . COLONOSCOPY    . FOOT SURGERY     foot surgery x 2  . HARDWARE REMOVAL Left 07/06/2014   Procedure: Removal Deep Hardware Left Foot;  Surgeon: Nadara MustardMarcus Duda V, MD;  [facility];  Service: Orthopedics;  Laterality: Left;  . I&D EXTREMITY Bilateral 06/17/2014   Procedure: IRRIGATION AND DEBRIDEMENT BILATERAL FOOT WOUNDS;  Surgeon: Kathryne Hitchhristopher Y Blackman, MD;  Location: [facility] ORS;  Service: Orthopedics;  Laterality: Bilateral;  . I&D EXTREMITY Right 11/04/2014   Procedure: Excision Charcot Collapse Right Foot;  Surgeon: Nadara MustardMarcus Duda V, MD;  Location: [facility] OR;  Service: Orthopedics;  Laterality: Right;  . lap band surgery     2009  . LEFT HEART CATHETERIZATION WITH CORONARY ANGIOGRAM  N/A 06/16/2014   Procedure: LEFT HEART CATHETERIZATION WITH CORONARY ANGIOGRAM;  Surgeon: Micheline Chapman, MD;  Location: [facility] CATH LAB;;* Heavily calcified LAD w/ mild-mod diffuse disease, Large D1 ~30&40%. LCx ok to Large OM1 w/ ~70% focal disease in the AVG Cx, pOM1 75%. Dominant RCA - mid 40% & 50% with mod diffuse calcification, prior to PDA ~60%. EF 65-70% - Plan Med Rx unless Sx warrant PCI  . NM MYOVIEW LTD  05/2014   Intermediate risk.  EF 54%.  Medium sized, mild density reversible defect or wall.  Cannot exclude infarct versus diaphragmatic attenuation. --> Cath revealed OM1/ AVG Cx & dRCA moderate disease -Med Rx recommended  . TRANSTHORACIC ECHOCARDIOGRAM  05/2014    Mild LVH.  EF 45 to 50%.  Cannot exclude regional wall motion abnormality.  GR 1 DD.  Aortic sclerosis but no stenosis  . TRANSTHORACIC ECHOCARDIOGRAM  02/05/2018   Mild LVH.  EF 65-70%.  Hyperdynamic/vigorous.  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  No valvular lesion.  Mild pulmonary hypertension  . VASECTOMY       Current Meds  Medication Sig  . amLODipine (NORVASC) 5 MG tablet Take 1 tablet (5 mg total) by mouth daily.  . canagliflozin (INVOKANA) 300 MG TABS tablet Take 1 tablet (300 mg total) by mouth daily.  . celecoxib (CELEBREX) 200 MG capsule Take 1 capsule (200 mg total) by mouth 2 (two) times daily.  . Coenzyme Q10 200 MG capsule Take 1 capsule (200 mg total) by mouth daily.  [name] gabapentin (NEURONTIN) 600 MG tablet TAKE 2 TABLETS THREE TIMES A DAY  . [START ON 05/16/2018] HYDROcodone-acetaminophen (NORCO) 5-325 MG tablet Take 1 tablet by mouth 2 (two) times daily.  Melene Muller ON 04/16/2018] HYDROcodone-acetaminophen (NORCO) 5-325 MG tablet Take 1 tablet by mouth every 6 (six) hours as needed for moderate pain.  Bess Harvest Ethyl (VASCEPA) 1 g CAPS Take 1 capsule by mouth 2 (two) times daily.  . insulin aspart (NOVOLOG) 100 UNIT/ML injection Inject 3 units before lunch and supper daily  . Insulin Glargine (LANTUS SOLOSTAR) 100 UNIT/ML Solostar Pen Inject 10 Units into the skin daily at 10 pm. Adjust as directed  . Insulin Syringe-Needle U-100 28G X 5/16" 0.5 ML MISC Use to inject % units Novolog TID SubQ  . LORazepam (ATIVAN) 1 MG tablet Take 1 tablet (1 mg total) by mouth at bedtime as needed.  . metformin ([name]) 500 MG (OSM) 24 hr tablet Take 1 tablet (500 mg total) by mouth daily with breakfast.  . Multiple Vitamins tablet Take 1 tablet by mouth daily.  . Pitavastatin Calcium (LIVALO) 2 MG TABS Take 0.5 tablets by mouth daily.  . ramipril (ALTACE) 2.5 MG capsule Take 1 capsule (2.5 mg total) by mouth daily.  . Testosterone (ANDROGEL PUMP) 20.25 MG/ACT (1.62%) GEL Place 4 Applicatorfuls onto the skin daily.  . traMADol (ULTRAM) 50 MG tablet Take 2 tablets (100 mg total) by mouth 3 (three) times daily.  --He is also taking 81 mg aspirin daily.   Allergies  Allergen Reactions  . Beta Adrenergic Blockers Nausea And Vomiting  . Tetanus Toxoids     "Sore arm all the way down, Painful, red"   . Ciprofloxacin Other (See Comments)    Altered mental status  . Eggs Or Egg-Derived Products Diarrhea    Constant flu after flu shot.  . Labetalol Hcl Nausea Only  . Lansoprazole Other (See Comments)    Agitation  . Nsaids Other (See Comments)    Cannot  take nsaids-  etc due to heart   . Other     All betablockers make him nauseated ("vertical vomiting")  . Statins Other (See Comments)    York Spaniel it causes pain?    Social History   Tobacco Use  . Smoking status: Former Smoker    Types: Cigarettes    Last attempt to quit: 06/13/1988    Years since quitting: 29.8  . Smokeless tobacco: Never Used  . Tobacco comment: Smoked for ~8 yrs.  Substance Use Topics  . Alcohol use: Yes    Comment: three times a week, one 12-oz cider  . Drug use: No   Social History   Social History Narrative  . Not on file    family history includes Heart attack in his father.  Wt Readings from Last 3 Encounters:  03/18/18 271 lb (122.9 kg)  03/17/18 270 lb 4 oz (122.6 kg)  03/04/18 264 lb (119.7 kg)    PHYSICAL EXAM BP (!) 174/86   Pulse 97   Ht 5\' 8"  (1.727 m)   Wt 271 lb (122.9 kg)   BMI 41.21 kg/m  Physical Exam  Constitutional: He is oriented to person, place, and time. He appears well-developed and well-nourished.  Morbidly obese; very garulous - rambling  HENT:  Head: Normocephalic and atraumatic.  Eyes:  Irregular pupils  Neck: Normal range of motion. Neck supple. No hepatojugular reflux and no JVD present. Carotid bruit is not present (radiated murmur).  Cardiovascular: Normal rate and regular rhythm.  No extrasystoles are present. PMI is not displaced. Exam reveals decreased pulses (pedal). Exam reveals no gallop and no friction rub.  Murmur heard.  Medium-pitched harsh crescendo-decrescendo midsystolic murmur is present with a grade of 2/6 at the upper right sternal border radiating to the neck. Pulmonary/Chest: Effort normal and breath sounds normal. No respiratory distress.  He has no wheezes. He has no rales.  Abdominal: Soft. Bowel sounds are normal. He exhibits no distension. There is no tenderness. There is no rebound.  Unable to palpate HSM  Musculoskeletal: Normal range of motion. He exhibits edema (Trivial).  Neurological: He is alert and oriented to person, place, and time. No cranial nerve deficit.  Skin: Skin is warm and dry.  sweaty  Psychiatric: He has a normal mood and affect. Judgment and thought content normal.   He is quite verbose and tends to resort to curse words several times during the course of the clinic visit. Easily irritated.  Nursing note and vitals reviewed.    Adult ECG Report  Not checked   Other studies Reviewed: Additional studies/ records that were reviewed today include:  Recent Labs:   Lab Results  Component Value Date   CREATININE 0.67 (L) 03/17/2018   BUN 15 03/17/2018   NA 139 03/17/2018   K 5.2 03/17/2018   CL 99 03/17/2018   CO2 23 03/17/2018   Lab Results  Component Value Date   CHOL 182 02/02/2018   HDL 42 02/02/2018   LDLCALC 121 (H) 02/02/2018   TRIG 94 02/02/2018   CHOLHDL 4.3 02/02/2018    ASSESSMENT / PLAN: Problem List Items Addressed This Visit    Type 2 diabetes mellitus with complication, with long-term current use of insulin (HCC) (Chronic)   Relevant Medications   Pitavastatin Calcium (LIVALO) 2 MG TABS   Other Relevant Orders   Lipid panel   Comprehensive metabolic panel   Sleep apnea (Chronic)    Discussed the importance of consistently wearing CPAP.  Morbid obesity (HCC) (Chronic)    Has not really been able to exercise because of his neuropathic feet and poor balance.  I discussed considering using elliptical trainer or stationary bicycle.  Also discussed dietary modification.  Also discussed dietary habits.  He simply needs to lose weight in order to help control his blood pressure, diabetes and lipids.      Hyperlipidemia associated with type 2 diabetes mellitus (HCC)  (Chronic)    He does have hypertriglyceridemia as well as hyperlipidemia. LDL is 121, but his triglycerides are better at 94.  Apparently, he is now supposedly taking Livalo, which we will refill and provide with samples.  He should be due to have follow-up lipids checked in roughly August-September timeframe prior to being seen back in CV RRR.  With then see if he would potentially benefit from further titrating Livalo versus potentially considering  PCSK9 inhibitor.      Relevant Medications   Icosapent Ethyl (VASCEPA) 1 g CAPS   Pitavastatin Calcium (LIVALO) 2 MG TABS   Other Relevant Orders   Lipid panel   Comprehensive metabolic panel   Heart murmur (Chronic)    Previous echo and suggested aortic sclerosis without stenosis.  His current echo did not really mention aortic sclerosis, but suggested possible likely dynamic outflow tract obstruction due to hyperdynamic LV with septal thickening. No obvious valvular disease. Would probably benefit from continued blood pressure and rate control.      Essential hypertension (Chronic)    Hard to really know whether his blood pressure will be controlled or not since he is not taking medications that he was prescribed. I doubt very seriously that he had a significant drop in blood pressure with Benicar.  This is probably is just circumstantial issue.  Unfortunately now he is switched to amlodipine and verapamil which I just told him to start taking.  These blood pressures can be followed up in CV RRR with either or amlodipine and metoprolol being titrated further. Once these medications are titrated to peak dose, with his resting heart rate of 97 bpm he should be on a beta-blocker -   Would consider carvedilol or Bystolic, but bisoprolol would also be potentially an option to help avoid adverse side effects.      Relevant Medications   Icosapent Ethyl (VASCEPA) 1 g CAPS   Pitavastatin Calcium (LIVALO) 2 MG TABS   Coronary artery disease  involving native coronary artery without angina pectoris - Primary (Chronic)    Apparently he did not realize his heart catheterization showed much more than just single-vessel with moderate disease (likely discussing the AV groove circumflex lesion).  He did not know that there is other vessels involved.  Thankfully he has not had any anginal symptoms despite having poorly controlled blood pressure and lipids. He has a  diabetic former smoker with hypertension and hyperlipidemia who has had partial foot amputation from Charcot foot.  He has been on metabolic syndrome and needs aggressive risk factor modification.  I counseled him for about 20 out of 40 minutes, and this issue alone.  At least another 10 minutes was spent discussed the importance of acknowledging his health conditions and allowing his providers to treat.  Plan:  He is now on amlodipine as opposed to Benicar.  Not unreasonable for peripheral vasodilation as well as antianginal effect.  For now he is supposed to be taking 5 mg daily/I recommended that he start taking it and I would like to potentially titrate up to 10 if  possible. He is on ramipril which I would like to increase up to 5 mg daily (but he has not been started taking it.).  I have referred him to CV RRR for assistance and managing his lipids. On Invokana (same category medication as Jardiance which is shown cardiovascular benefit) No active anginal symptoms, however would like to probably restart some type of beta-blocker (despite the fact that there is a "allergy list")      Relevant Medications   Icosapent Ethyl (VASCEPA) 1 g CAPS   Pitavastatin Calcium (LIVALO) 2 MG TABS   Other Relevant Orders   Lipid panel   Comprehensive metabolic panel      I spent a total of 45 minutes with the patient and chart review. >  50% of the time was spent in direct patient consultation.   Current medicines are reviewed at length with the patient today.  (+/- concerns) - not taking  Benicar, refuses statin The following changes have been made:  see below  Patient Instructions  Medication  No changes    Take the - blood pressure  Medication -your primary  Prescribed   Labs in aug or sept  cmp Lipid   Your physician recommends that you schedule a follow-up appointment in SEPT 2019 WITH CV[facility]- CHOLESTEROL    Your physician recommends that you schedule a follow-up appointment in 4 months with DR Azyria Osmon.     Studies Ordered:   Orders Placed This Encounter  Procedures  . Lipid panel  . Comprehensive metabolic panel      Bryan Lemma, M.D., M.S. Interventional Cardiologist   Pager # [phone] Phone # [phone] [address] [place], [place] [address]   Thank you for choosing [facility] at [facility]!!

## 2018-03-18 NOTE — Patient Instructions (Addendum)
Medication  No changes    Take the - blood pressure  Medication -your primary  Prescribed   Labs in aug or sept  cmp Lipid   Your physician recommends that you schedule a follow-up appointment in SEPT 2019 WITH CV[facility]- CHOLESTEROL    Your physician recommends that you schedule a follow-up appointment in 4 months with DR HARDING.

## 2018-03-26 ENCOUNTER — Encounter: Payer: Self-pay | Admitting: Cardiology

## 2018-03-26 ENCOUNTER — Telehealth: Payer: Self-pay | Admitting: Pharmacist Clinician (PhC)/ Clinical Pharmacy Specialist

## 2018-03-26 NOTE — Assessment & Plan Note (Signed)
Hard to really know whether his blood pressure will be controlled or not since he is not taking medications that he was prescribed. I doubt very seriously that he had a significant drop in blood pressure with Benicar.  This is probably is just circumstantial issue.  Unfortunately now he is switched to amlodipine and verapamil which I just told him to start taking.  These blood pressures can be followed up in CV RRR with either or amlodipine and metoprolol being titrated further. Once these medications are titrated to peak dose, with his resting heart rate of 97 bpm he should be on a beta-blocker -   Would consider carvedilol or Bystolic, but bisoprolol would also be potentially an option to help avoid adverse side effects.

## 2018-03-26 NOTE — Assessment & Plan Note (Signed)
Discussed the importance of consistently wearing CPAP.

## 2018-03-26 NOTE — Assessment & Plan Note (Addendum)
Has not really been able to exercise because of his neuropathic feet and poor balance.  I discussed considering using elliptical trainer or stationary bicycle.  Also discussed dietary modification.  Also discussed dietary habits.  He simply needs to lose weight in order to help control his blood pressure, diabetes and lipids.

## 2018-03-26 NOTE — Assessment & Plan Note (Signed)
Apparently he did not realize his heart catheterization showed much more than just single-vessel with moderate disease (likely discussing the AV groove circumflex lesion).  He did not know that there is other vessels involved.  Thankfully he has not had any anginal symptoms despite having poorly controlled blood pressure and lipids. He has a  diabetic former smoker with hypertension and hyperlipidemia who has had partial foot amputation from Charcot foot.  He has been on metabolic syndrome and needs aggressive risk factor modification.  I counseled him for about 20 out of 40 minutes, and this issue alone.  At least another 10 minutes was spent discussed the importance of acknowledging his health conditions and allowing his providers to treat.  Plan:  He is now on amlodipine as opposed to Benicar.  Not unreasonable for peripheral vasodilation as well as antianginal effect.  For now he is supposed to be taking 5 mg daily/I recommended that he start taking it and I would like to potentially titrate up to 10 if possible. He is on ramipril which I would like to increase up to 5 mg daily (but he has not been started taking it.).  I have referred him to CV RRR for assistance and managing his lipids. On Invokana (same category medication as [place] PepperJardiance which is shown cardiovascular benefit) No active anginal symptoms, however would like to probably restart some type of beta-blocker (despite the fact that there is a "allergy list")

## 2018-03-26 NOTE — Assessment & Plan Note (Signed)
Previous echo and suggested aortic sclerosis without stenosis.  His current echo did not really mention aortic sclerosis, but suggested possible likely dynamic outflow tract obstruction due to hyperdynamic LV with septal thickening. No obvious valvular disease. Would probably benefit from continued blood pressure and rate control.

## 2018-03-26 NOTE — Telephone Encounter (Signed)
Spoke with patient - he is doing well with the Livalo 1 mg daily.  Had some muscle issues in about week 3, but then felt better after 4-5 days.  Continues on 1 mg without issue.  Advised him to continue with this dose until we get to 12 weeks and repeat lipid labs.   At that time will determine if the dose should be increase

## 2018-03-26 NOTE — Assessment & Plan Note (Signed)
He does have hypertriglyceridemia as well as hyperlipidemia. LDL is 121, but his triglycerides are better at 94.  Apparently, he is now supposedly taking Livalo, which we will refill and provide with samples.  He should be due to have follow-up lipids checked in roughly August-September timeframe prior to being seen back in CV RRR.  With then see if he would potentially benefit from further titrating Livalo versus potentially considering  PCSK9 inhibitor.

## 2018-04-20 ENCOUNTER — Ambulatory Visit: Payer: BLUE CROSS/BLUE SHIELD | Admitting: Family Medicine

## 2018-05-01 DIAGNOSIS — M1612 Unilateral primary osteoarthritis, left hip: Secondary | ICD-10-CM | POA: Diagnosis not present

## 2018-05-01 DIAGNOSIS — M879 Osteonecrosis, unspecified: Secondary | ICD-10-CM | POA: Diagnosis not present

## 2018-05-01 DIAGNOSIS — Z887 Allergy status to serum and vaccine status: Secondary | ICD-10-CM | POA: Diagnosis not present

## 2018-05-01 DIAGNOSIS — M25552 Pain in left hip: Secondary | ICD-10-CM | POA: Diagnosis not present

## 2018-05-01 DIAGNOSIS — Z9181 History of falling: Secondary | ICD-10-CM | POA: Diagnosis not present

## 2018-05-01 DIAGNOSIS — Z888 Allergy status to other drugs, medicaments and biological substances status: Secondary | ICD-10-CM | POA: Diagnosis not present

## 2018-05-01 DIAGNOSIS — Z01818 Encounter for other preprocedural examination: Secondary | ICD-10-CM | POA: Diagnosis not present

## 2018-05-05 DIAGNOSIS — I2510 Atherosclerotic heart disease of native coronary artery without angina pectoris: Secondary | ICD-10-CM | POA: Diagnosis not present

## 2018-05-05 DIAGNOSIS — E785 Hyperlipidemia, unspecified: Secondary | ICD-10-CM | POA: Diagnosis not present

## 2018-05-05 DIAGNOSIS — E118 Type 2 diabetes mellitus with unspecified complications: Secondary | ICD-10-CM | POA: Diagnosis not present

## 2018-05-05 DIAGNOSIS — E1169 Type 2 diabetes mellitus with other specified complication: Secondary | ICD-10-CM | POA: Diagnosis not present

## 2018-05-05 LAB — COMPREHENSIVE METABOLIC PANEL
ALK PHOS: 79 IU/L (ref 39–117)
ALT: 17 IU/L (ref 0–44)
AST: 17 IU/L (ref 0–40)
Albumin/Globulin Ratio: 1.8 (ref 1.2–2.2)
Albumin: 4.6 g/dL (ref 3.5–5.5)
BILIRUBIN TOTAL: 0.5 mg/dL (ref 0.0–1.2)
BUN / CREAT RATIO: 20 (ref 9–20)
BUN: 17 mg/dL (ref 6–24)
CHLORIDE: 91 mmol/L — AB (ref 96–106)
CO2: 17 mmol/L — AB (ref 20–29)
CREATININE: 0.83 mg/dL (ref 0.76–1.27)
Calcium: 9.7 mg/dL (ref 8.7–10.2)
GFR calc Af Amer: 111 mL/min/{1.73_m2} (ref >59)
GFR calc non Af Amer: 96 mL/min/{1.73_m2} (ref >59)
GLUCOSE: 468 mg/dL — AB (ref 65–99)
Globulin, Total: 2.5 g/dL (ref 1.5–4.5)
Potassium: 4.8 mmol/L (ref 3.5–5.2)
Sodium: 130 mmol/L — ABNORMAL LOW (ref 134–144)
Total Protein: 7.1 g/dL (ref 6.0–8.5)

## 2018-05-05 LAB — LIPID PANEL
CHOL/HDL RATIO: 5.2 ratio — AB (ref 0.0–5.0)
Cholesterol, Total: 178 mg/dL (ref 100–199)
HDL: 34 mg/dL — AB (ref >39)
LDL CALC: 94 mg/dL (ref 0–99)
Triglycerides: 251 mg/dL — ABNORMAL HIGH (ref 0–149)
VLDL CHOLESTEROL CAL: 50 mg/dL — AB (ref 5–40)

## 2018-05-06 NOTE — Progress Notes (Signed)
05/07/2018 TAHA DIMOND 03/15/1961 [id number]   HPI:  Alexander Pennington is a 57 y.o. male patient of Dr Herbie Baltimore, who presents today for a lipid clinic evaluation.  His CAD consists of a heavily calcified LAD, and > 50% calcifications in OM1, AV groove and PDA.   His other medical history includes hemorrhagic stroke, hypertension, testosterone deficiency, diabetes with nephropathy and neuropathy, obesity (post lap band in 2009), and sleep apnea.  In addition he suffers from Charcot foot and has lost 3 toes on one foot and has had wound problems on the other.  He wears tight work boots to help with neuropathy and stability.  He has lost 160 pounds since his surgery.  Patient complains of heightened sensitivities to many medications.  He had done well with Diovan, but because of the recall was switched to Benicar.  He reports that this "floored" him and has caused severe constipation.  His diabetes is poorly controlled, and at his last visit thought he was having a hypoglycemic spell.  I checked his sugar and it was 294.  I would suspect that he begins to feel hypoglycemic at a fairly high level.  He admits that he never checks his sugar when he feels it "crash", or even after having something to eat.    Today he has an improved LDL cholesterol on just 1 mg Livalo once weekly.  He is not having any side effects.    His PCP switched him off olmesartan due to questionable side effects.  Because of his continued elevated pressure he was given amlodipine 5 mg and ramipril 2.5 mg.  He never picked these up and refused to take.  Stated his home BP runs 120's systolic at night and only as high as 140's during the day.    We have not been able to validate his readings.    Current Medications:  None  Cholesterol Goals:   LDL < 70 Intolerant/previously tried: - back pain  pitavastatin 2 mg  Pravastatin 20 mg  Family history:   Father first MI at 12, had CABG, then again at 58; died recently at 63  Mother still  alive, 69, in good health for 51 (mental illness runs in her family)  One brother had heart valve replaced at 42  Another brother died from cancer at 43 (anal)   Diet:    Eats out only 1-2 times per week (Sushi every Saturday); fried chicken once monthly; eats a lot of salad, avocados; fish (pollock), some ground beef/turkey; veggies are fresh, occasionally frozen; grapes, bananas most days; protein shake and toast each morning.  Exercise:    Unable to do much d/t -3 toes on one foot; gait problems due to stroke; has recumbant bike, did about 1 km yesterday, does mow own yard;  Labs:   05/05/18:  TC 178, TG 251, HDL 34, LDL 94 (livalo 1 mg once weekly)  02/02/18: TC 182, TG 94, HDL 42, LDL 121   09/17/17:  TC 199, TG 410, HDL 34, LDL not calculated  Current Outpatient Medications  Medication Sig Dispense Refill  . canagliflozin (INVOKANA) 300 MG TABS tablet Take 1 tablet (300 mg total) by mouth daily. 90 tablet 1  . celecoxib (CELEBREX) 200 MG capsule Take 1 capsule (200 mg total) by mouth 2 (two) times daily. 180 capsule 1  . Coenzyme Q10 200 MG capsule Take 1 capsule (200 mg total) by mouth daily.    [name] gabapentin (NEURONTIN) 600 MG tablet TAKE 2 TABLETS THREE  TIMES A DAY 540 tablet 3  . [START ON 05/16/2018] HYDROcodone-acetaminophen (NORCO) 5-325 MG tablet Take 1 tablet by mouth 2 (two) times daily. 60 tablet 0  . HYDROcodone-acetaminophen (NORCO) 5-325 MG tablet Take 1 tablet by mouth every 6 (six) hours as needed for moderate pain. 60 tablet 0  . Icosapent Ethyl (VASCEPA) 1 g CAPS Take 1 capsule by mouth 2 (two) times daily.    . insulin aspart (NOVOLOG) 100 UNIT/ML injection Inject 3 units before lunch and supper daily 3 vial 3  . Insulin Glargine (LANTUS SOLOSTAR) 100 UNIT/ML Solostar Pen Inject 10 Units into the skin daily at 10 pm. Adjust as directed 5 pen 1  . Insulin Syringe-Needle U-100 28G X 5/16" 0.5 ML MISC Use to inject % units Novolog TID SubQ 270 each 1  . LORazepam (ATIVAN) 1  MG tablet Take 1 tablet (1 mg total) by mouth at bedtime as needed. 30 tablet 2  . metformin ([name]) 500 MG (OSM) 24 hr tablet Take 1 tablet (500 mg total) by mouth daily with breakfast. 90 tablet 1  . Multiple Vitamins tablet Take 1 tablet by mouth daily.    . Pitavastatin Calcium (LIVALO) 2 MG TABS Take 0.5 tablets by mouth daily.    . Testosterone (ANDROGEL PUMP) 20.25 MG/ACT (1.62%) GEL Place 4 Applicatorfuls onto the skin daily. 450 g 1  . traMADol (ULTRAM) 50 MG tablet Take 2 tablets (100 mg total) by mouth 3 (three) times daily. 180 tablet 2  . valsartan (DIOVAN) 160 MG tablet Take 1 tablet (160 mg total) by mouth daily. 30 tablet 5   No current facility-administered medications for this visit.     Allergies  Allergen Reactions  . Beta Adrenergic Blockers Nausea And Vomiting  . Tetanus Toxoids     "Sore arm all the way down, Painful, red"  . Ciprofloxacin Other (See Comments)    Altered mental status  . Eggs Or Egg-Derived Products Diarrhea    Constant flu after flu shot.  . Labetalol Hcl Nausea Only  . Lansoprazole Other (See Comments)    Agitation  . Nsaids Other (See Comments)    Cannot take nsaids-  etc due to heart   . Other     All betablockers make him nauseated ("vertical vomiting")  . Statins Other (See Comments)    York SpanielSaid it causes pain?    Past Medical History:  Diagnosis Date  . CAD (coronary artery disease) 06/2014   Cath: Heavily calcified LAD with mild  diffuse disease, Large d1 ~30&40%. LCx ok up to Large OM1 where there is ~70% focal disease in the AVG Cx, proxOM1 75%. Dominant RCA - mid 40% & 50% with moderate diffuse calcification, prior to PDA ~60%. EF 65-70% --> Recommended Med Cx (PCI if Sx warrant)  . Charcot foot due to diabetes mellitus (HCC)   . Complication of anesthesia    Pt. reports that he has done well the last several surgeries. hx deviated septum, sleep apnea- has had problems. Pt. reports that the tube was pulled out too soon, due to  sleep apnea , decreasing sats, & when they tried to put it back it went into his sinus, this event occured with Lap Band surgery when he had a lot more weight on him   . Diabetes mellitus (HCC)    type 2  . Diabetic Charcot foot (HCC)   . Family history of anesthesia complication    mom with n/v  . Hemorrhagic stroke (HCC)    a.  Dx 2010. 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 Unclear etiology. right eye damaged - has no central vision, has a little problem with balance  . Hyperlipidemia   . Hypertension   . Kidney stones   . Murmur, heart Fall 2014 - diagnosed  . Neuropathy   . Obesity    a. hx of lap band surgery.  . Sleep apnea    uses c-pap    Blood pressure (!) 194/98, pulse 84.   Essential hypertension Patient with essential hypertension poorly controlled.  He has refused to take ramipril and amlodipine.  Stated for years did well on Diovan.  Discussed with him the need for medication and he is willing to try generic Diovan.  Will start him at the 160 mg dose and have him keep track of home readings.  He should return in 4 weeks for follow up.    Hyperlipidemia associated with type 2 diabetes mellitus (HCC) Patient with improved LDL on Livalo 1 mg once weekly.  Will have him increase to 1 mg twice weekly and repeat lipid labs in 3 months.  Patient concerned about increase in triglycerides, however his A1c has increased to > 9.  Explained that as the A1c drops, we should see some drop in TG back towards normal.     Phillips HayKristin Alvstad PharmD CPP [facility] [address]

## 2018-05-07 ENCOUNTER — Ambulatory Visit (INDEPENDENT_AMBULATORY_CARE_PROVIDER_SITE_OTHER): Payer: BLUE CROSS/BLUE SHIELD | Admitting: Pharmacist Clinician (PhC)/ Clinical Pharmacy Specialist

## 2018-05-07 DIAGNOSIS — I10 Essential (primary) hypertension: Secondary | ICD-10-CM | POA: Diagnosis not present

## 2018-05-07 DIAGNOSIS — E785 Hyperlipidemia, unspecified: Secondary | ICD-10-CM

## 2018-05-07 DIAGNOSIS — E1169 Type 2 diabetes mellitus with other specified complication: Secondary | ICD-10-CM

## 2018-05-07 MED ORDER — VALSARTAN 160 MG PO TABS: 160.0000 mg | ORAL_TABLET | Freq: Every day | ORAL | 5 refills | Status: DC

## 2018-05-07 NOTE — Assessment & Plan Note (Signed)
Patient with essential hypertension poorly controlled.  He has refused to take ramipril and amlodipine.  Stated for years did well on Diovan.  Discussed with him the need for medication and he is willing to try generic Diovan.  Will start him at the 160 mg dose and have him keep track of home readings.  He should return in 4 weeks for follow up.

## 2018-05-07 NOTE — Patient Instructions (Addendum)
  Return for follow up in 4 weeks to check blood pressure  Go to the lab in 3 months for repeat lipids  Your blood pressure today is 194/98  Check your blood pressure at home daily and keep record of the readings.  Take your BP meds as follows:  Start valsartan 160 mg once daily for your blood pressure  Increase Livalo to 1 mg (1/2 of 2 mg tab) twice weekly   Bring all of your meds, your BP cuff and your record of home blood pressures to your next appointment.  Exercise as you're able, try to walk approximately 30 minutes per day.  Keep salt intake to a minimum, especially watch canned and prepared boxed foods.  Eat more fresh fruits and vegetables and fewer canned items.  Avoid eating in fast food restaurants.    HOW TO TAKE YOUR BLOOD PRESSURE: . Rest 5 minutes before taking your blood pressure. .  Don't smoke or drink caffeinated beverages for at least 30 minutes before. . Take your blood pressure before (not after) you eat. . Sit comfortably with your back supported and both feet on the floor (don't cross your legs). . Elevate your arm to heart level on a table or a desk. . Use the proper sized cuff. It should fit smoothly and snugly around your bare upper arm. There should be enough room to slip a fingertip under the cuff. The bottom edge of the cuff should be 1 inch above the crease of the elbow. . Ideally, take 3 measurements at one sitting and record the average.

## 2018-05-07 NOTE — Assessment & Plan Note (Signed)
Patient with improved LDL on Livalo 1 mg once weekly.  Will have him increase to 1 mg twice weekly and repeat lipid labs in 3 months.  Patient concerned about increase in triglycerides, however his A1c has increased to > 9.  Explained that as the A1c drops, we should see some drop in TG back towards normal.

## 2018-05-15 DIAGNOSIS — H02409 Unspecified ptosis of unspecified eyelid: Secondary | ICD-10-CM | POA: Diagnosis not present

## 2018-05-15 DIAGNOSIS — H02834 Dermatochalasis of left upper eyelid: Secondary | ICD-10-CM | POA: Diagnosis not present

## 2018-05-15 DIAGNOSIS — H02831 Dermatochalasis of right upper eyelid: Secondary | ICD-10-CM | POA: Diagnosis not present

## 2018-05-19 ENCOUNTER — Telehealth: Payer: Self-pay | Admitting: Family Medicine

## 2018-05-19 ENCOUNTER — Other Ambulatory Visit: Payer: Self-pay | Admitting: Family Medicine

## 2018-05-19 DIAGNOSIS — E1121 Type 2 diabetes mellitus with diabetic nephropathy: Secondary | ICD-10-CM

## 2018-05-19 NOTE — Telephone Encounter (Signed)
Patient aware.

## 2018-05-19 NOTE — Telephone Encounter (Signed)
Please contact the patient: I wrote A1c order as lab collect. Should be able to go to any [facility] for it.

## 2018-05-19 NOTE — Telephone Encounter (Signed)
Please advise 

## 2018-05-20 ENCOUNTER — Other Ambulatory Visit: Payer: Self-pay | Admitting: *Deleted

## 2018-05-20 DIAGNOSIS — E1121 Type 2 diabetes mellitus with diabetic nephropathy: Secondary | ICD-10-CM

## 2018-05-20 NOTE — Telephone Encounter (Signed)
Left message to call if still having problems getting lab work. Chart has current lab work and results.

## 2018-05-20 NOTE — Telephone Encounter (Signed)
Patient aware , lab order for A1C , per Willis Modena MD,was printed and faxed to [facility] lab [phone].  Their office number is [phone] choose option 7.

## 2018-05-28 DIAGNOSIS — L989 Disorder of the skin and subcutaneous tissue, unspecified: Secondary | ICD-10-CM | POA: Diagnosis not present

## 2018-06-04 ENCOUNTER — Other Ambulatory Visit: Payer: Self-pay | Admitting: Family Medicine

## 2018-06-08 ENCOUNTER — Telehealth: Payer: Self-pay | Admitting: Family Medicine

## 2018-06-08 MED ORDER — METFORMIN HCL ER (OSM) 500 MG PO TB24: 500.0000 mg | ORAL_TABLET | Freq: Every day | ORAL | 1 refills | Status: DC

## 2018-06-08 NOTE — Telephone Encounter (Signed)
Which pharm??  LM [id number]

## 2018-06-08 NOTE — Telephone Encounter (Signed)
Pt states that it is suppose to be sent to mail order

## 2018-06-09 ENCOUNTER — Encounter: Payer: Self-pay | Admitting: Pharmacist

## 2018-06-09 ENCOUNTER — Ambulatory Visit (INDEPENDENT_AMBULATORY_CARE_PROVIDER_SITE_OTHER): Payer: BLUE CROSS/BLUE SHIELD | Admitting: Pharmacist

## 2018-06-09 VITALS — BP 152/74 | HR 82

## 2018-06-09 DIAGNOSIS — I10 Essential (primary) hypertension: Secondary | ICD-10-CM

## 2018-06-09 MED ORDER — VALSARTAN 160 MG PO TABS: ORAL_TABLET | ORAL | 1 refills | Status: DC

## 2018-06-09 NOTE — Assessment & Plan Note (Signed)
Blood pressure improved but remains above desired goal of 130/80. Patient experienced dizzines while taking valsartan 320mg  but 160mg  not enough to control BP. He also report increased diuresis with Invokana and is against taking any  diuretic at this time. Will increase valsartan to 160mg  in AM and 80 mg in PM, repeat BMET in 2-3 weeks, and follow up with DR Herbie BaltimoreHarding in 5 weeks.

## 2018-06-09 NOTE — Patient Instructions (Addendum)
Return for a follow up appointment in 5 weeks (Dr Herbie BaltimoreHarding)  Go to the lab in 2 - 3weeks  Check your blood pressure at home daily (if able) and keep record of the readings.  Take your BP meds as follows: *INCREASE valsartan to 160mg  every morning and 80mg  every afternoon*  Bring all of your meds, your BP cuff and your record of home blood pressures to your next appointment.  Exercise as you're able, try to walk approximately 30 minutes per day.  Keep salt intake to a minimum, especially watch canned and prepared boxed foods.  Eat more fresh fruits and vegetables and fewer canned items.  Avoid eating in fast food restaurants.    HOW TO TAKE YOUR BLOOD PRESSURE: . Rest 5 minutes before taking your blood pressure. .  Don't smoke or drink caffeinated beverages for at least 30 minutes before. . Take your blood pressure before (not after) you eat. . Sit comfortably with your back supported and both feet on the floor (don't cross your legs). . Elevate your arm to heart level on a table or a desk. . Use the proper sized cuff. It should fit smoothly and snugly around your bare upper arm. There should be enough room to slip a fingertip under the cuff. The bottom edge of the cuff should be 1 inch above the crease of the elbow. . Ideally, take 3 measurements at one sitting and record the average.

## 2018-06-09 NOTE — Progress Notes (Signed)
HPI:  Alexander Pennington is a 57 y.o. male patient of Dr Herbie BaltimoreHarding, who presents today for HTN follow up.  PMH includes CAD, hemorrhagic stroke, hypertension, testosterone deficiency, diabetes with nephropathy and neuropathy, obesity (post lap band in 2009), and sleep apnea.  In addition he suffers from Charcot foot and has lost 3 toes on one foot and has had wound problems on the other.  He wears tight work boots to help with neuropathy and stability.  He has lost 160 pounds since his surgery.  Patient complains of heightened sensitivities to many medications.  He had done well with Diovan, but because of the recall was switched to Benicar.  He reports that this "floored" him and has caused severe constipation.  His diabetes is poorly controlled, and at his last visit thought he was having a hypoglycemic spell.  I checked his sugar and it was 294.  I would suspect that he begins to feel hypoglycemic at a fairly high level.  He admits that he never checks his sugar when he feels it "crash", or even after having something to eat.    His PCP switched him off olmesartan due to questionable side effects.  Because of his continued elevated pressure he was given amlodipine 5 mg and ramipril 2.5 mg.  He never picked these up and refused to take.  During most recent office visit we discontinued amlodipine and ramipril. Valsartan was resumed  160mg  daily for BP control.    Current Medications:  Valsartan 160mg  daily  Family history:   Father first MI at 2659, had CABG, then again at 6869; died recently at 3683  Mother still alive, 2592, in good health for 2292 (mental illness runs in her family)  One brother had heart valve replaced at 1750  Another brother died from cancer at 1052 (anal)   Diet:   Eats out only 1-2 times per week (Sushi every Saturday); fried chicken once monthly; eats a lot of salad, avocados; fish (pollock), some ground beef/turkey; veggies are fresh, occasionally frozen; grapes, bananas most days; protein  shake and toast each morning.  Exercise:    Unable to do much d/t -3 toes on one foot; gait problems due to stroke; has recumbant bike, did about 1 km yesterday, does mow own yard;   Home BP: Livongo arm cuff with Bluetooth connection (unfortunalety his device is reading > 10mmHg difference form manual reading) Average 135/62 (for 90 days) per software ; Last 5 days 135/62; 136/61; 148/67; 154/67  Current Outpatient Medications  Medication Sig Dispense Refill  . canagliflozin (INVOKANA) 300 MG TABS tablet Take 1 tablet (300 mg total) by mouth daily. 90 tablet 1  . celecoxib (CELEBREX) 200 MG capsule Take 1 capsule (200 mg total) by mouth 2 (two) times daily. 180 capsule 1  . Coenzyme Q10 200 MG capsule Take 1 capsule (200 mg total) by mouth daily.    [name]. gabapentin (NEURONTIN) 600 MG tablet TAKE 2 TABLETS THREE TIMES A DAY 540 tablet 3  . HYDROcodone-acetaminophen (NORCO) 5-325 MG tablet Take 1 tablet by mouth 2 (two) times daily. 60 tablet 0  . HYDROcodone-acetaminophen (NORCO) 5-325 MG tablet Take 1 tablet by mouth every 6 (six) hours as needed for moderate pain. 60 tablet 0  . Icosapent Ethyl (VASCEPA) 1 g CAPS Take 1 capsule by mouth 2 (two) times daily.    . insulin aspart (NOVOLOG) 100 UNIT/ML injection Inject 3 units before lunch and supper daily 3 vial 3  . Insulin Glargine (LANTUS  SOLOSTAR) 100 UNIT/ML Solostar Pen Inject 10 Units into the skin daily at 10 pm. Adjust as directed 5 pen 1  . Insulin Syringe-Needle U-100 28G X 5/16" 0.5 ML MISC Use to inject % units Novolog TID SubQ 270 each 1  . LORazepam (ATIVAN) 1 MG tablet Take 1 tablet (1 mg total) by mouth at bedtime as needed. 30 tablet 2  . metformin ([name]) 500 MG (OSM) 24 hr tablet Take 1 tablet (500 mg total) by mouth daily with breakfast. 90 tablet 1  . Multiple Vitamins tablet Take 1 tablet by mouth daily.    . Pitavastatin Calcium (LIVALO) 2 MG TABS Take 0.5 tablets by mouth daily.    . Testosterone (ANDROGEL PUMP) 20.25  MG/ACT (1.62%) GEL Place 4 Applicatorfuls onto the skin daily. 450 g 1  . traMADol (ULTRAM) 50 MG tablet Take 2 tablets (100 mg total) by mouth 3 (three) times daily. 180 tablet 2  . valsartan (DIOVAN) 160 MG tablet Take 160mg  (1 tablet) every morning and 80mg  (1/2 tablet) every afternoon. 135 tablet 1   No current facility-administered medications for this visit.     Allergies  Allergen Reactions  . Beta Adrenergic Blockers Nausea And Vomiting  . Tetanus Toxoids     "Sore arm all the way down, Painful, red"  . Ciprofloxacin Other (See Comments)    Altered mental status  . Eggs Or Egg-Derived Products Diarrhea    Constant flu after flu shot.  . Labetalol Hcl Nausea Only  . Lansoprazole Other (See Comments)    Agitation  . Nsaids Other (See Comments)    Cannot take nsaids-  etc due to heart   . Other     All betablockers make him nauseated ("vertical vomiting")  . Statins Other (See Comments)    York Spaniel it causes pain?    Past Medical History:  Diagnosis Date  . CAD (coronary artery disease) 06/2014   Cath: Heavily calcified LAD with mild  diffuse disease, Large d1 ~30&40%. LCx ok up to Large OM1 where there is ~70% focal disease in the AVG Cx, proxOM1 75%. Dominant RCA - mid 40% & 50% with moderate diffuse calcification, prior to PDA ~60%. EF 65-70% --> Recommended Med Cx (PCI if Sx warrant)  . Charcot foot due to diabetes mellitus (HCC)   . Complication of anesthesia    Pt. reports that he has done well the last several surgeries. hx deviated septum, sleep apnea- has had problems. Pt. reports that the tube was pulled out too soon, due to sleep apnea , decreasing sats, & when they tried to put it back it went into his sinus, this event occured with Lap Band surgery when he had a lot more weight on him   . Diabetes mellitus (HCC)    type 2  . Diabetic Charcot foot (HCC)   . Family history of anesthesia complication    mom with n/v  . Hemorrhagic stroke (HCC)    a. Dx 2010. Patient  reports it was venous related. Unclear etiology. right eye damaged - has no central vision, has a little problem with balance  . Hyperlipidemia   . Hypertension   . Kidney stones   . Murmur, heart Fall 2014 - diagnosed  . Neuropathy   . Obesity    a. hx of lap band surgery.  . Sleep apnea    uses c-pap    Blood pressure (!) 152/74, pulse 82.   Essential hypertension Blood pressure improved but remains above desired goal of  130/80. Patient experienced dizzines while taking valsartan 320mg  but 160mg  not enough to control BP. He also report increased diuresis with Invokana and is against taking any  diuretic at this time. Will increase valsartan to 160mg  in AM and 80 mg in PM, repeat BMET in 2-3 weeks, and follow up with DR Herbie Baltimore in 5 weeks.   Alexander Pennington PharmD, BCPS, CPP [facility] [address] [place] [address] 06/09/2018 10:26 AM

## 2018-06-24 ENCOUNTER — Telehealth: Payer: Self-pay

## 2018-06-24 NOTE — Telephone Encounter (Signed)
   [facility] Pre-operative Risk Assessment    Request for surgical clearance:  1. What type of surgery is being performed? Bilateral blepharoplasty  2. When is this surgery scheduled? 07/01/18  3. What type of clearance is required (medical clearance vs. Pharmacy clearance to hold med vs. Both)? Both  4. Are there any medications that need to be held prior to surgery and how long? Aspirin  5. Practice name and name of physician performing surgery? [facility]  6. What is your office phone number [phone]   7.   What is your office fax number [phone]  8.   Anesthesia type (None, local, MAC, general) ? Not listed   Kathyrn Lass 06/24/2018, 1:47 PM  _________________________________________________________________   (provider comments below)

## 2018-06-24 NOTE — Telephone Encounter (Signed)
   Primary Cardiologist: Bryan Lemma, MD  Chart reviewed as part of pre-operative protocol coverage. Left voice mail to call back between pre-op hours.   [place], [place] 06/24/2018, 3:38 PM

## 2018-06-26 ENCOUNTER — Encounter: Payer: Self-pay | Admitting: Family Medicine

## 2018-06-26 ENCOUNTER — Other Ambulatory Visit: Payer: Self-pay | Admitting: Family Medicine

## 2018-06-26 ENCOUNTER — Ambulatory Visit (INDEPENDENT_AMBULATORY_CARE_PROVIDER_SITE_OTHER): Payer: BLUE CROSS/BLUE SHIELD | Admitting: Family Medicine

## 2018-06-26 VITALS — BP 151/87 | HR 97 | Temp 97.6°F | Ht 68.0 in | Wt 265.0 lb

## 2018-06-26 DIAGNOSIS — Z794 Long term (current) use of insulin: Secondary | ICD-10-CM

## 2018-06-26 DIAGNOSIS — E785 Hyperlipidemia, unspecified: Secondary | ICD-10-CM

## 2018-06-26 DIAGNOSIS — E118 Type 2 diabetes mellitus with unspecified complications: Secondary | ICD-10-CM

## 2018-06-26 DIAGNOSIS — E1142 Type 2 diabetes mellitus with diabetic polyneuropathy: Secondary | ICD-10-CM

## 2018-06-26 DIAGNOSIS — E1169 Type 2 diabetes mellitus with other specified complication: Secondary | ICD-10-CM

## 2018-06-26 DIAGNOSIS — I10 Essential (primary) hypertension: Secondary | ICD-10-CM | POA: Diagnosis not present

## 2018-06-26 DIAGNOSIS — E1121 Type 2 diabetes mellitus with diabetic nephropathy: Secondary | ICD-10-CM

## 2018-06-26 LAB — CMP14+EGFR
ALBUMIN: 4.9 g/dL (ref 3.5–5.5)
ALT: 27 IU/L (ref 0–44)
AST: 22 IU/L (ref 0–40)
Albumin/Globulin Ratio: 2 (ref 1.2–2.2)
Alkaline Phosphatase: 96 IU/L (ref 39–117)
BUN/Creatinine Ratio: 29 — ABNORMAL HIGH (ref 9–20)
BUN: 21 mg/dL (ref 6–24)
Bilirubin Total: 0.5 mg/dL (ref 0.0–1.2)
CALCIUM: 10.6 mg/dL — AB (ref 8.7–10.2)
CO2: 21 mmol/L (ref 20–29)
CREATININE: 0.73 mg/dL — AB (ref 0.76–1.27)
Chloride: 94 mmol/L — ABNORMAL LOW (ref 96–106)
GFR calc Af Amer: 117 mL/min/{1.73_m2} (ref >59)
GFR calc non Af Amer: 102 mL/min/{1.73_m2} (ref >59)
GLOBULIN, TOTAL: 2.5 g/dL (ref 1.5–4.5)
Glucose: 303 mg/dL — ABNORMAL HIGH (ref 65–99)
Potassium: 5.1 mmol/L (ref 3.5–5.2)
SODIUM: 135 mmol/L (ref 134–144)
Total Protein: 7.4 g/dL (ref 6.0–8.5)

## 2018-06-26 LAB — BAYER DCA HB A1C WAIVED: HB A1C: 8.7 % — AB (ref <7.0)

## 2018-06-26 MED ORDER — HYDROCODONE-ACETAMINOPHEN 5-325 MG PO TABS: 1.0000 | ORAL_TABLET | Freq: Two times a day (BID) | ORAL | 0 refills | Status: DC

## 2018-06-26 MED ORDER — TRAMADOL HCL 50 MG PO TABS: 100.0000 mg | ORAL_TABLET | Freq: Three times a day (TID) | ORAL | 2 refills | Status: DC

## 2018-06-26 MED ORDER — METFORMIN HCL ER (MOD) 1000 MG PO TB24: 1000.0000 mg | ORAL_TABLET | Freq: Two times a day (BID) | ORAL | 1 refills | Status: DC

## 2018-06-26 NOTE — Progress Notes (Signed)
Subjective:  Patient ID: Alexander Pennington,  male    DOB: 08-26-1961  Age: 57 y.o.    CC: Diabetes; Hypertension; and Hyperlipidemia   HPI ANACLETO BATTERMAN presents for  follow-up of hypertension. Patient has no history of headache chest pain or shortness of breath or recent cough. Patient also denies symptoms of TIA such as numbness weakness lateralizing. Patient denies side effects from medication. States taking it regularly.  Patient also  in for follow-up of elevated cholesterol. Doing well without complaints on Livalo. States it works well for him since it isn't a statin. It was prescribed by Dr. Allison Quarry pharm doc. Pt. Says she told him it wasn't a statin. Denies side effects  including myalgia and arthralgia and nausea. Also in today for liver function testing. Currently no chest pain, shortness of breath or other cardiovascular related symptoms noted.  Follow-up of diabetes. Patient denies symptoms such as excessive hunger or urinary frequency, excessive hunger, nausea No significant hypoglycemic spells noted. Says he feels bad if glucose drops below 150s.  Medications reviewed. Pt reports taking them regularly. Pt. denies complication/adverse reaction today.   Planning right hip replacement. Has to get A1c below 8 and BMI below 40. Now on diet started by the Pharm doc. Also increased his Metformin to 1000 mg ER BID. Stopped the HCTZ and urinary frequency resolved. Denies prostate enlargement.   History Harvey has a past medical history of CAD (coronary artery disease) (06/2014), Charcot foot due to diabetes mellitus ([place]), Complication of anesthesia, Diabetes mellitus ([place]), Diabetic Charcot foot ([place]), Family history of anesthesia complication, Hemorrhagic stroke ([place]), Hyperlipidemia, Hypertension, Kidney stones, Murmur, heart (Fall 2014 - diagnosed), Neuropathy, Obesity, and Sleep apnea.   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   His family history includes Heart attack in his father.He reports that he quit smoking about 30 years ago. His smoking use included cigarettes. He has never used smokeless tobacco. He reports that he drinks alcohol. He reports that he does not use drugs.  Current Outpatient Medications on File Prior to Visit  Medication Sig Dispense Refill  . canagliflozin (INVOKANA) 300 MG TABS tablet Take 1 tablet (300 mg total) by mouth daily. 90 tablet 1  . celecoxib (CELEBREX) 200 MG capsule Take 1 capsule (200 mg total) by mouth 2 (two) times daily. 180 capsule 1  . Coenzyme Q10 200 MG capsule Take 1 capsule (200 mg total) by mouth daily.    [name] gabapentin (NEURONTIN) 600 MG tablet TAKE 2 TABLETS THREE TIMES A DAY 540 tablet 3  . Icosapent Ethyl (VASCEPA) 1 g CAPS Take 1 capsule by mouth 2 (two) times daily.    . insulin aspart (NOVOLOG) 100 UNIT/ML injection Inject 3 units before lunch and supper daily 3 vial 3  . Insulin Glargine (LANTUS SOLOSTAR) 100 UNIT/ML Solostar Pen Inject 10 Units into the skin daily at 10 pm. Adjust as directed 5 pen 1  . Insulin Syringe-Needle U-100 28G X 5/16" 0.5 ML MISC Use to inject % units Novolog TID SubQ 270 each 1  . LORazepam (ATIVAN) 1 MG tablet Take 1 tablet (1 mg total) by mouth at bedtime as needed. 30 tablet 2  . Multiple Vitamins tablet Take  1 tablet by mouth daily.    . Pitavastatin Calcium (LIVALO) 2 MG TABS Take 0.5 tablets by mouth daily.    . Testosterone (ANDROGEL PUMP) 20.25 MG/ACT (1.62%) GEL Place 4 Applicatorfuls onto the skin daily. 450 g 1  . valsartan (DIOVAN) 160 MG tablet Take  175m (1 tablet) every morning and 868m(1/2 tablet) every afternoon. 135 tablet 1   No current facility-administered medications on file prior to visit.     ROS Review of Systems  Constitutional: Negative.   HENT: Negative.   Eyes: Negative for visual disturbance.  Respiratory: Negative for cough and shortness of breath.   Cardiovascular: Negative for chest pain and leg swelling.  Gastrointestinal: Negative for abdominal pain, diarrhea, nausea and vomiting.  Genitourinary: Negative for difficulty urinating.  Musculoskeletal: Positive for arthralgias (both hips, R>L). Negative for myalgias.  Skin: Negative for rash.  Neurological: Positive for numbness (and pain from neuropathy, BLE). Negative for headaches.  Psychiatric/Behavioral: Negative for sleep disturbance.    Objective:  BP (!) 151/87   Pulse 97   Temp 97.6 F (36.4 C)   Ht '5\' 8"'  (1.727 m)   Wt 265 lb (120.2 kg)   BMI 40.29 kg/m   BP Readings from Last 3 Encounters:  06/26/18 (!) 151/87  06/09/18 (!) 152/74  05/07/18 (!) 194/98    Wt Readings from Last 3 Encounters:  06/26/18 265 lb (120.2 kg)  03/18/18 271 lb (122.9 kg)  03/17/18 270 lb 4 oz (122.6 kg)     Physical Exam  Constitutional: He is oriented to person, place, and time. He appears well-developed and well-nourished. No distress.  HENT:  Head: Normocephalic and atraumatic.  Right Ear: External ear normal.  Left Ear: External ear normal.  Nose: Nose normal.  Mouth/Throat: Oropharynx is clear and moist.  Eyes: Pupils are equal, round, and reactive to light. Conjunctivae and EOM are normal.  Neck: Normal range of motion. Neck supple.  Cardiovascular: Normal rate, regular rhythm and normal heart sounds.  No murmur heard. Pulmonary/Chest: Effort normal and breath sounds normal. No respiratory distress. He has no wheezes. He has no rales.  Abdominal: Soft. Guarding: hips.  Musculoskeletal: Normal range of motion. He exhibits tenderness (hips).   Neurological: He is alert and oriented to person, place, and time. He has normal reflexes. Sensory deficit: Neuropathy, BLE.  Skin: Skin is warm and dry.  Psychiatric: He has a normal mood and affect. His behavior is normal. Judgment and thought content normal.    Diabetic Foot Exam - Simple   No data filed        Assessment & Plan:   DaThedfordas seen today for diabetes, hypertension and hyperlipidemia.  Diagnoses and all orders for this visit:  Type 2 diabetes mellitus with complication, with long-term current use of insulin (HCC) -     CMP14+EGFR -     POCT glycosylated hemoglobin (Hb A1C) -     metformin (GLUMETZA) 1000 MG (MOD) 24 hr tablet; Take 1 tablet (1,000 mg total) by mouth 2 (two) times daily with a meal.  Diabetic nephropathy associated with type 2 diabetes mellitus (HCC) -     metformin (GLUMETZA) 1000 MG (MOD) 24 hr tablet; Take 1 tablet (1,000 mg total) by mouth 2 (two) times daily with a meal.  Diabetic peripheral neuropathy associated with type 2 diabetes mellitus (HCC) -     metformin (GLUMETZA) 1000 MG (MOD) 24 hr tablet; Take 1 tablet (1,000 mg total) by mouth 2 (two) times daily with a  meal. -     traMADol (ULTRAM) 50 MG tablet; Take 2 tablets (100 mg total) by mouth 3 (three) times daily.  Essential hypertension  Hyperlipidemia associated with type 2 diabetes mellitus ([place])  Other orders -     HYDROcodone-acetaminophen (NORCO) 5-325 MG tablet; Take 1 tablet by mouth 2 (two) times daily. -     HYDROcodone-acetaminophen (NORCO) 5-325 MG tablet; Take 1 tablet by mouth 2 (two) times daily. -     HYDROcodone-acetaminophen (NORCO) 5-325 MG tablet; Take 1 tablet by mouth 2 (two) times daily after a meal. Take for moderate to severe pain   I have changed Mack Guise. Hayse's metFORMIN and HYDROcodone-acetaminophen. I am also having him start on HYDROcodone-acetaminophen. Additionally, I am having him maintain his Multiple Vitamins, Coenzyme Q10, Insulin  Syringe-Needle U-100, Testosterone, gabapentin, canagliflozin, celecoxib, Insulin Glargine, insulin aspart, LORazepam, Icosapent Ethyl, Pitavastatin Calcium, valsartan, HYDROcodone-acetaminophen, and traMADol.  Meds ordered this encounter  Medications  . metformin (GLUMETZA) 1000 MG (MOD) 24 hr tablet    Sig: Take 1 tablet (1,000 mg total) by mouth 2 (two) times daily with a meal.    Dispense:  180 tablet    Refill:  1  . HYDROcodone-acetaminophen (NORCO) 5-325 MG tablet    Sig: Take 1 tablet by mouth 2 (two) times daily.    Dispense:  60 tablet    Refill:  0  . HYDROcodone-acetaminophen (NORCO) 5-325 MG tablet    Sig: Take 1 tablet by mouth 2 (two) times daily.    Dispense:  60 tablet    Refill:  0  . HYDROcodone-acetaminophen (NORCO) 5-325 MG tablet    Sig: Take 1 tablet by mouth 2 (two) times daily after a meal. Take for moderate to severe pain    Dispense:  60 tablet    Refill:  0  . traMADol (ULTRAM) 50 MG tablet    Sig: Take 2 tablets (100 mg total) by mouth 3 (three) times daily.    Dispense:  180 tablet    Refill:  2   Pt. Working with Cardiology pharm doc to manage HTN and cholesterol. Some intervention for DM as well. Follow-up: Return in about 3 months (around 09/26/2018).  Claretta Fraise, M.D.

## 2018-06-26 NOTE — Telephone Encounter (Signed)
Dr. Herbie Baltimore can pt hold asprin for eye surgery for 7 days?   He will see you most likely before any surgery.  But just so we have the info.  He has no chest pain or SOB.  Has trouble with activity due to hip pain and will also need hip surgery.  But just let us know about asprin.

## 2018-06-29 ENCOUNTER — Other Ambulatory Visit: Payer: BLUE CROSS/BLUE SHIELD

## 2018-06-29 DIAGNOSIS — E1121 Type 2 diabetes mellitus with diabetic nephropathy: Secondary | ICD-10-CM

## 2018-06-29 LAB — BAYER DCA HB A1C WAIVED: HB A1C: 8.1 % — AB (ref <7.0)

## 2018-07-01 DIAGNOSIS — E119 Type 2 diabetes mellitus without complications: Secondary | ICD-10-CM | POA: Diagnosis not present

## 2018-07-02 NOTE — Telephone Encounter (Signed)
Please clarify if still need clearance.   2. When is this surgery scheduled? 07/01/18

## 2018-07-02 NOTE — Telephone Encounter (Signed)
Called and spoke with Rosey Bath from [facility].  She confirms that pt has not had his surgery yet, that it has been put on hold waiting for sx clearance.  Will send it back to preop pool.

## 2018-07-02 NOTE — Telephone Encounter (Signed)
Will review pre-op clearance during office visit with Dr. Herbie Baltimore 07/23/18.

## 2018-07-06 NOTE — Telephone Encounter (Signed)
OK to hold ASA x 5-7 days. Does not need any additional pre-op workup for Eye Sgx.  Bryan Lemma, MD

## 2018-07-06 NOTE — Telephone Encounter (Signed)
   Primary Cardiologist:Darren Herbie Baltimore, MD  Chart reviewed as part of pre-operative protocol coverage. Pre-op clearance already addressed by colleagues in earlier phone notes. To summarize recommendations:  - Nada Boozer, NP reviewed with patient and found he has had no recent CP or SOB. - Per Dr. Herbie Baltimore, "OK to hold ASA x 5-7 days. Does not need any additional pre-op workup for Eye Sgx."  Will route this bundled recommendation to requesting provider via Epic fax function. Please call with questions.  Laurann Montana, PA-C 07/06/2018, 3:33 PM

## 2018-07-07 ENCOUNTER — Ambulatory Visit: Payer: BLUE CROSS/BLUE SHIELD

## 2018-07-23 ENCOUNTER — Ambulatory Visit (INDEPENDENT_AMBULATORY_CARE_PROVIDER_SITE_OTHER): Payer: BLUE CROSS/BLUE SHIELD | Admitting: Cardiology

## 2018-07-23 ENCOUNTER — Encounter: Payer: Self-pay | Admitting: Cardiology

## 2018-07-23 VITALS — BP 160/78 | HR 96 | Ht 68.0 in | Wt 265.0 lb

## 2018-07-23 DIAGNOSIS — E1169 Type 2 diabetes mellitus with other specified complication: Secondary | ICD-10-CM

## 2018-07-23 DIAGNOSIS — R0989 Other specified symptoms and signs involving the circulatory and respiratory systems: Secondary | ICD-10-CM

## 2018-07-23 DIAGNOSIS — R011 Cardiac murmur, unspecified: Secondary | ICD-10-CM

## 2018-07-23 DIAGNOSIS — I10 Essential (primary) hypertension: Secondary | ICD-10-CM

## 2018-07-23 DIAGNOSIS — E785 Hyperlipidemia, unspecified: Secondary | ICD-10-CM

## 2018-07-23 DIAGNOSIS — G4733 Obstructive sleep apnea (adult) (pediatric): Secondary | ICD-10-CM

## 2018-07-23 DIAGNOSIS — Z01810 Encounter for preprocedural cardiovascular examination: Secondary | ICD-10-CM

## 2018-07-23 DIAGNOSIS — I2510 Atherosclerotic heart disease of native coronary artery without angina pectoris: Secondary | ICD-10-CM | POA: Diagnosis not present

## 2018-07-23 MED ORDER — VALSARTAN 160 MG PO TABS: 160.0000 mg | ORAL_TABLET | Freq: Two times a day (BID) | ORAL | 3 refills | Status: DC

## 2018-07-23 NOTE — Patient Instructions (Addendum)
Medication Instructions:  INCREASE VALSARTAN 160 MG  TWICE A DAY- OKAY  TO STOP ASPIRIN FOR SURGERIES THAT ARE COMING UP  If you need a refill on your cardiac medications before your next appointment, please call your pharmacy.   Lab work: LIPID CMP  If you have labs (blood work) drawn today and your tests are completely normal, you will receive your results only by: [name] MyChart Message (if you have MyChart) OR . A paper copy in the mail If you have any lab test that is abnormal or we need to change your treatment, we will call you to review the results.  Testing/Procedures: SCHEDULE AT [address] Your physician has requested that you have a carotid duplex. This test is an ultrasound of the carotid arteries in your neck. It looks at blood flow through these arteries that supply the brain with blood. Allow one hour for this exam. There are no restrictions or special instructions.    Follow-Up: At [facility], you and your health needs are our priority.  As part of our continuing mission to provide you with exceptional heart care, we have created designated Provider Care Teams.  These Care Teams include your primary Cardiologist (physician) and Advanced Practice Providers (APPs -  Physician Assistants and Nurse Practitioners) who all work together to provide you with the care you need, when you need it.  Your physician recommends that you schedule a follow-up appointment in 2 WEEKS WITH CVRR- BLOOD PRESSURE, LIPIDS  You will need a follow up appointment in 6 months.  Please call our office 2 months in advance to schedule this appointment.  You may see Bryan Lemma, MD or one of the following Advanced Practice Providers on your designated Care Team:   Theodore Demark, PA-C . Joni Reining, DNP, ANP  Any Other Special Instructions Will Be Listed Below (If Applicable).   YOU CAN HAVE EYE AND HIP SURGERIES- OKAY TO HOLD ASPIRIN FOR EACH. YOU WOULD BE  INTERMEDIATE RISK FOR THE SURGERIES.

## 2018-07-23 NOTE — Progress Notes (Signed)
PCP: Mechele Claude, MD  Clinic Note: Chief Complaint  Patient presents with  . Follow-up    No cardiac complaints  . Pre-op Exam    Blepharoplasty and hip surgery  . Coronary Artery Disease    No angina    HPI: Alexander Pennington is a 57 y.o. 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   Alexander Pennington was initially seen by cardiology (Dr. Mayford Knife) in consultation (Sept 2015) -this was in the setting of diabetic foot ulcer in the left foot leading to osteomyelitis.  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 --> as part of pre-pop evaluation, he had an abnormal Myoview & Cath: Myoview: Intermediate risk.  EF 54%.  Medium sized, mild density reversible defect or wall.  Cannot exclude infarct versus diaphragmatic attenuation. --> Cath revealed moderate-severe OM1/ AVG Cx & dRCA moderate disease -Med Rx recommended , especially in order not to delay his amputation surgery.     He was seen by Dr. Allyson Sabal on August 16, 2014 lost to follow-up since - until I saw him in May 2019 & again in July. -- July visit was b/c SEM heard on exam -- > Echo ordered and referred to CVRR lipid clinic (not yet started on PCSK9 inhibitor because he was "thinking about it ".  Recent Hospitalizations: none  Studies Personally Reviewed - (if available, images/films reviewed: From Epic Chart or Care Everywhere) - PSH/PMH updated.  No new study  Interval History: Patient returns for cardiology follow-up.  He really is doing okay from a cardiac standpoint.  He has not yet started on PCSK9 inhibitor fearful of hypersensitivity.  He does not really do a lot of activity because he is limited now because his left hip has been hurting him so much  that he can barely walk.  He has not complained of any Anginal Symptoms despite Having He Does Have Some Exertional Dyspnea but That Is Been Chronic and Has Not Changed.  He Is Compliant with His CPAP and Bleeds and Nighttime Oxygen with His Nasal Pillows.  With his CPAP he denies any PND orthopnea.  Without it, he is would certainly get short of breath laying down flat but not necessarily related to orthopnea as much is OSA related shortness of breath.  He denies any rapid irregular heartbeats or palpitations beats here and there.  No syncope/near syncope or TIA/amaurosis fugax. He is also limited by his Charcot foot in addition to his hip.  He therefore does not do a lot of walking probably not getting accurately on claudication symptoms.  When he was following up with CV RR, he was started on Livalo which he just recently stopped because of "tearing a muscle in his left shoulder ".  He now has limited range of motion on the left shoulder.  He thinks that this has to do with taking Livalo and therefore stopped taking it.  He has pending eye surgery which sounds like blepharoplasty but has been told that he has to hold aspirin for 10 days for that (not a usual time to hold aspirin).  He also is A1c below 7 to get hip surgery done.  He would like to before the end of the year.  He reminded  me again about how his Charcot foot and neuropathy pains of notably improved as a result of him rubbing testosterone gel on his legs etc.  ROS: A comprehensive was performed. Review of Systems  Constitutional: Positive for malaise/fatigue. Negative for chills, diaphoresis and fever.  HENT: Negative for congestion and nosebleeds.   Respiratory: Negative for cough, shortness of breath and wheezing.   Gastrointestinal: Negative for abdominal pain, blood in stool, constipation, heartburn and melena.  Genitourinary: Negative for hematuria.  Musculoskeletal: Positive for back pain, joint pain and myalgias (Left  arm/shoulder).       Foot pain; Unsteady gait  Neurological: Positive for tingling (Peripheral neuropathy). Negative for dizziness, focal weakness and headaches.  Psychiatric/Behavioral: Negative for depression and memory loss. The patient is not nervous/anxious and does not have insomnia.   All other systems reviewed and are negative.   I have reviewed and (if needed) personally updated the patient's problem list, medications, allergies, past medical and surgical history, social and family history.   Past Medical History:  Diagnosis Date  . CAD (coronary artery disease) 06/2014   Cath: Heavily calcified LAD with mild  diffuse disease, Large d1 ~30&40%. LCx ok up to Large OM1 where there is ~70% focal disease in the AVG Cx, proxOM1 75%. Dominant RCA - mid 40% & 50% with moderate diffuse calcification, prior to PDA ~60%. EF 65-70% --> Recommended Med Cx (PCI if Sx warrant)  . Charcot foot due to diabetes mellitus (HCC)   . Complication of anesthesia    Pt. reports that he has done well the last several surgeries. hx deviated septum, sleep apnea- has had problems. Pt. reports that the tube was pulled out too soon, due to sleep apnea , decreasing sats, & when they tried to put it back it went into his sinus, this event occured with Lap Band surgery when he had a lot more weight on him   . Diabetes mellitus (HCC)    type 2  . Diabetic Charcot foot (HCC)   . Family history of anesthesia complication    mom with n/v  . Hemorrhagic stroke (HCC)    a. Dx 2010. 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 Unclear etiology. right eye damaged - has no central vision, has a little problem with balance  . Hyperlipidemia   . Hypertension   . Kidney stones   . Murmur, heart Fall 2014 - diagnosed  . Neuropathy   . Obesity    a. hx of lap band surgery.  . Sleep apnea    uses c-pap   -- Bunyan himself seems to have relatively poor insight when he comes to understanding his health with diabetes (complicated  by PAD, amputation and Charcot foot), hypertension and documented coronary disease.  He does not seem to be all that interested in taking medications, and is pretty much refusing to take a statin.  Never had ARB dose filled. Activity limited by complications of Charcot foot.  Seen in CVRR in July, August and Sept.  Was "going to consider" PCSK9-I.   Past Surgical History:  Procedure Laterality Date  . AMPUTATION Left 07/06/2014   Procedure: Left 2nd and Possible 3rd Ray Amputation;  Surgeon: Nadara Mustard, MD;  Location: [facility] OR;  Service: Orthopedics;  Laterality: Left;  . AMPUTATION Left 09/23/2014   Procedure: Foot 5th Ray Amputation;  Surgeon: Nadara Mustard, MD;  [facility];  Service: Orthopedics;  Laterality: Left;  . CARPAL TUNNEL RELEASE Left    bone removed also, nerve  also cut  . COLONOSCOPY    . FOOT SURGERY     foot surgery x 2  . HARDWARE REMOVAL Left 07/06/2014   Procedure: Removal Deep Hardware Left Foot;  Surgeon: Nadara Mustard, MD;  Location: [facility] OR;  Service: Orthopedics;  Laterality: Left;  . I&D EXTREMITY Bilateral 06/17/2014   Procedure: IRRIGATION AND DEBRIDEMENT BILATERAL FOOT WOUNDS;  Surgeon: Kathryne Hitch, MD;  Location: [facility] ORS;  Service: Orthopedics;  Laterality: Bilateral;  . I&D EXTREMITY Right 11/04/2014   Procedure: Excision Charcot Collapse Right Foot;  Surgeon: Nadara Mustard, MD;  Location: [facility] OR;  Service: Orthopedics;  Laterality: Right;  . lap band surgery     2009  . LEFT HEART CATHETERIZATION WITH CORONARY ANGIOGRAM N/A 06/16/2014   Procedure: LEFT HEART CATHETERIZATION WITH CORONARY ANGIOGRAM;  Surgeon: Micheline Chapman, MD;  Location: [facility] CATH LAB;;* Heavily calcified LAD w/ mild-mod diffuse disease, Large D1 ~30&40%. LCx ok to Large OM1 w/ ~70% focal disease in the AVG Cx, pOM1 75%. Dominant RCA - mid 40% & 50% with mod diffuse calcification, prior to PDA ~60%. EF 65-70% - Plan Med Rx unless Sx warrant PCI  . NM MYOVIEW LTD  05/2014   Intermediate  risk.  EF 54%.  Medium sized, mild density reversible defect or wall.  Cannot exclude infarct versus diaphragmatic attenuation. --> Cath revealed OM1/ AVG Cx & dRCA moderate disease -Med Rx recommended  . TRANSTHORACIC ECHOCARDIOGRAM  05/2014    Mild LVH.  EF 45 to 50%.  Cannot exclude regional wall motion abnormality.  GR 1 DD.  Aortic sclerosis but no stenosis  . TRANSTHORACIC ECHOCARDIOGRAM  02/05/2018   Mild LVH.  EF 65-70%.  Hyperdynamic/vigorous.  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  No valvular lesion.  Mild pulmonary hypertension  . VASECTOMY      Current Meds  Medication Sig  . canagliflozin (INVOKANA) 300 MG TABS tablet Take 1 tablet (300 mg total) by mouth daily.  . celecoxib (CELEBREX) 200 MG capsule Take 1 capsule (200 mg total) by mouth 2 (two) times daily.  . Coenzyme Q10 200 MG capsule Take 1 capsule (200 mg total) by mouth daily.  [name] gabapentin (NEURONTIN) 600 MG tablet TAKE 2 TABLETS THREE TIMES A DAY  . [START ON 08/25/2018] HYDROcodone-acetaminophen (NORCO) 5-325 MG tablet Take 1 tablet by mouth 2 (two) times daily after a meal. Take for moderate to severe pain  . Icosapent Ethyl (VASCEPA) 1 g CAPS Take 1 capsule by mouth 2 (two) times daily.  . insulin aspart (NOVOLOG) 100 UNIT/ML injection Inject 3 units before lunch and supper daily  . Insulin Syringe-Needle U-100 28G X 5/16" 0.5 ML MISC Use to inject % units Novolog TID SubQ  . LORazepam (ATIVAN) 1 MG tablet Take 1 tablet (1 mg total) by mouth at bedtime as needed.  . metformin (GLUMETZA) 1000 MG (MOD) 24 hr tablet Take 1 tablet (1,000 mg total) by mouth 2 (two) times daily with a meal.  . Multiple Vitamins tablet Take 1 tablet by mouth daily.  . Testosterone (ANDROGEL PUMP) 20.25 MG/ACT (1.62%) GEL Place 4 Applicatorfuls onto the skin daily.  . traMADol (ULTRAM) 50 MG tablet Take 2 tablets (100 mg total) by mouth 3 (three) times daily.  . [DISCONTINUED] valsartan (DIOVAN) 160 MG tablet Take 160mg  (1 tablet) every  morning and 80mg  (1/2 tablet) every afternoon.  --He is also taking 81 mg aspirin daily.  After being switched to Benicar/olmesartan from Diovan/valsartan, he was converted to amlodipine 5 mg  and ramipril 2.5 mg.  He never filled those medications.  He was then restarted on valsartan 160 mg once availability was better.  Has been taking 160 mg in the morning and 80 mg in the evening of valsartan.   Allergies  Allergen Reactions  . Beta Adrenergic Blockers Nausea And Vomiting  . Tetanus Toxoids     "Sore arm all the way down, Painful, red"  . Ciprofloxacin Other (See Comments)    Altered mental status  . Eggs Or Egg-Derived Products Diarrhea    Constant flu after flu shot.  . Labetalol Hcl Nausea Only  . Lansoprazole Other (See Comments)    Agitation  . Nsaids Other (See Comments)    Cannot take nsaids-  etc due to heart   . Other     All betablockers make him nauseated ("vertical vomiting")  . Statins Other (See Comments)    York Spaniel it causes pain?   "complains of heightened sensitivities to many medications.  He had done well with Diovan, but because of the recall was switched to Benicar.  He reports that this "floored" him and has caused severe constipation.  His diabetes is poorly controlled, and at his last visit thought he was having a hypoglycemic spell.  I checked his sugar and it was 294.  I would suspect that he begins to feel hypoglycemic at a fairly high level.  He admits that he never checks his sugar when he feels it "crash", or even after having something to eat.    Social History   Tobacco Use  . Smoking status: Former Smoker    Types: Cigarettes    Last attempt to quit: 06/13/1988    Years since quitting: 30.1  . Smokeless tobacco: Never Used  . Tobacco comment: Smoked for ~8 yrs.  Substance Use Topics  . Alcohol use: Yes    Comment: three times a week, one 12-oz cider  . Drug use: No   Social History   Social History Narrative   Diet: Eats out only 1-2 times  per week (Sushi every Saturday); fried chicken once monthly; eats a lot of salad, avocados; fish (pollock), some ground beef/turkey; veggies are fresh, occasionally frozen; grapes, bananas most days; protein shake and toast each morning.   Family History family history includes CAD (age of onset: 75) in his father; Cancer - Colon in his brother; Healthy in his mother; Heart attack (age of onset: 36) in his father; Valvular heart disease (age of onset: 47) in his brother.    Wt Readings from Last 3 Encounters:  07/23/18 265 lb (120.2 kg)  06/26/18 265 lb (120.2 kg)  03/18/18 271 lb (122.9 kg)    PHYSICAL EXAM BP (!) 160/78   Pulse 96   Ht 5\' 8"  (1.727 m)   Wt 265 lb (120.2 kg)   SpO2 96%   BMI 40.29 kg/m  Physical Exam  Constitutional: He is oriented to person, place, and time. He appears well-developed and well-nourished. No distress.  Morbidly obese; very garulous - rambling  HENT:  Head: Normocephalic and atraumatic.  Eyes: EOM are normal.  Irregular pupils  Neck: Normal range of motion. Neck supple. No hepatojugular reflux and no JVD present. Carotid bruit is present (radiated murmur versus bilateral bruits).  Cardiovascular: Normal rate, regular rhythm, S1 normal and S2 normal.  Occasional extrasystoles are present. PMI is not displaced. Exam reveals distant heart sounds and decreased pulses (pedal). Exam reveals no gallop and no friction rub.  Murmur heard.  Medium-pitched harsh  crescendo-decrescendo midsystolic murmur is present with a grade of 3/6 at the upper right sternal border radiating to the neck. Pulmonary/Chest: Effort normal and breath sounds normal. No respiratory distress. He has no wheezes. He has no rales.  Abdominal: Soft. Bowel sounds are normal. He exhibits no distension. There is no tenderness. There is no rebound.  Unable to palpate HSM  Musculoskeletal: He exhibits edema (Trivial with mild stasis changes.).  Reduced range of motion of left hip and left  arm/shoulder.  Neurological: He is alert and oriented to person, place, and time.  Skin: Skin is warm and dry.  Mild venous stasis changes  Psychiatric: He has a normal mood and affect. His behavior is normal. Judgment and thought content normal.  Vitals reviewed.    Adult ECG Report Not checked  Other studies Reviewed: Additional studies/ records that were reviewed today include:  Recent Labs:   Lab Results  Component Value Date   CREATININE 0.73 (L) 06/26/2018   BUN 21 06/26/2018   NA 135 06/26/2018   K 5.1 06/26/2018   CL 94 (L) 06/26/2018   CO2 21 06/26/2018   Lab Results  Component Value Date   CHOL 178 05/05/2018   HDL 34 (L) 05/05/2018   LDLCALC 94 05/05/2018   TRIG 251 (H) 05/05/2018   CHOLHDL 5.2 (H) 05/05/2018  -- Unable to calculate LDL 2/2 high TG  ASSESSMENT / PLAN: Problem List Items Addressed This Visit    Carotid bruit present    Cannot tell if I am hearing carotid bruits or radiated murmur.  Also with reduced radial pulses we will check carotid artery Dopplers which will also allow [place] to evaluate subclavian arteries.      Relevant Orders   VAS US CAROTID   Coronary artery disease involving native coronary artery without angina pectoris - Primary (Chronic)    Moderate to severe coronary disease noted on cath 4 years ago with no active anginal symptoms to speak of.  Hyperdynamic, preserved LVEF on echo would suggest that he has not had any effect of chronic ischemic disease.  At this point I do not think any additional testing is required.  Stress test would probably be abnormal which would then need to another heart catheterization and therefore another discussion about what to do with his coronary disease that has been stable.  He is on aspirin, but it could be held for surgery is (I do not think 10 days is necessary based on platelet lifespan.  7 days would be adequate --but this is irrelevant and would be fine for 7 to 10 days.).  As for medical  management he is on co-every 10 and we will get him back to CV RR to discuss PCSK9 inhibitor since he is not taking statin. Not on beta-blocker partially because of his hypoglycemic concerns but also intolerance.  He is back on ARB which we will titrate up. No requirement for nitrate in the absence of any anginal symptoms.      Relevant Medications   valsartan (DIOVAN) 160 MG tablet   aspirin EC 81 MG tablet   Other Relevant Orders   Lipid panel   Comprehensive metabolic panel   Decreased radial pulse   Relevant Orders   VAS US CAROTID   Essential hypertension (Chronic)    Blood pressure is high today.  Will increase to 160 mg twice daily valsartan.  Apparently he did not do well with the high dose bolus having hypotension episodes.  We therefore split the dose  of 320 mg in half and do it twice daily. He claimed intolerance to amlodipine which would be another option going forward.  We could also potentially consider trying Bystolic given his CAD.  Probably would not need diuretic as he is now on Invokana and not really noting any edema now.      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  This brings the importance of blood pressure control and avoiding dehydration.      Hyperlipidemia associated with type 2 diabetes mellitus (HCC) (Chronic)    I think he just has it in his mind that he would not be a take any cholesterol medicines.  He is claiming that his left arm injury is related to Livalo.  At this point I do not think he will take any statins.  We will recheck lipid panel and have him return to CV RR to discuss starting PCSK9 inhibitor with our clinical pharmacist team.  He seems to be resolved that he will try PCSK9-I.       Relevant Medications   valsartan (DIOVAN) 160 MG tablet   aspirin EC 81 MG tablet   Other Relevant Orders   Lipid panel   Comprehensive  metabolic panel   Preoperative cardiovascular examination    Alexander Pennington has insulin-dependent diabetes mellitus, type II and history of stroke.  He has normal renal function, and no evidence of heart failure.  He does have coronary disease but no evidence of angina.  Blepharoplasty is a low risk surgery which he should be able do fine without any changes to his medical regimen.  Hip replacement surgery is at least an intermediate release risk surgery, and with his existing risk factors he would be at least an intermediate risk patient for pretty much any surgery.  He does not tolerate beta-blockers so we could not use this for protection nor can we use calcium channel blockers at this point.  May consider diltiazem in the future.  As for doing any further cardiac evaluation, I do not think there is any need to because he is not having symptoms.  We are likely not can see anything improved, if not possibly even worse from a stress test or heart catheterization standpoint.  However in the absence of symptoms I would not advocate further testing.  His echocardiogram shows normal function and therefore I have no reason to believe that he had chronic ischemia.  Would be preferable to proceed with his operation and then if necessary address his coronary disease as opposed to doing at the opposite direction.  Daily which show that in the absence of symptoms, no additional testing is needed, as it would not change my current plan of treatment.  He did fine in 2015 having surgery with no adverse cardiac events.    Delaying surgery would not help because he will continue to be delayed as far as physical recovery.  This only serves to worsen his overall outcome.  Recommendation: Okay to hold aspirin 7 to 10 days preop.  Okay to go to surgery without further cardiac evaluation.  Intermediate risk patient for whatever surgery.      Sleep apnea (Chronic)    He does well with his CPAP now that he has nasal pillows.   Bleeding and nighttime oxygen.  Just need to make sure that he has follow-up for parts and equipment etc.  I spent a total of 50+ minutes with the patient and chart review. >  50% of the time was spent in direct patient consultation.  A good portion of the visit with him is simply dealing with his tendency to ramble about different issues.  Very difficult to keep on target.  He also just has very poor insight making it very difficult to explain treatment options.  Current medicines are reviewed at length with the patient today.  (+/- concerns) - not taking Livalo now.,  He is agreeable to discuss PCSK9 better. The following changes have been made:  see below  Patient Instructions  Medication Instructions:  INCREASE VALSARTAN 160 MG  TWICE A DAY- OKAY  TO STOP ASPIRIN FOR SURGERIES THAT ARE COMING UP  If you need a refill on your cardiac medications before your next appointment, please call your pharmacy.   Lab work: LIPID CMP  If you have labs (blood work) drawn today and your tests are completely normal, you will receive your results only by: [name] MyChart Message (if you have MyChart) OR . A paper copy in the mail If you have any lab test that is abnormal or we need to change your treatment, we will call you to review the results.  Testing/Procedures: SCHEDULE AT [address] Your physician has requested that you have a carotid duplex. This test is an ultrasound of the carotid arteries in your neck. It looks at blood flow through these arteries that supply the brain with blood. Allow one hour for this exam. There are no restrictions or special instructions.    Follow-Up: At [facility], you and your health needs are our priority.  As part of our continuing mission to provide you with exceptional heart care, we have created designated Provider Care Teams.  These Care Teams include your primary Cardiologist (physician) and Advanced Practice Providers  (APPs -  Physician Assistants and Nurse Practitioners) who all work together to provide you with the care you need, when you need it.  Your physician recommends that you schedule a follow-up appointment in 2 WEEKS WITH CVRR- BLOOD PRESSURE, LIPIDS  You will need a follow up appointment in 6 months.  Please call our office 2 months in advance to schedule this appointment.  You may see Bryan Lemma, MD or one of the following Advanced Practice Providers on your designated Care Team:   Theodore Demark, PA-C . Joni Reining, DNP, ANP  Any Other Special Instructions Will Be Listed Below (If Applicable).   YOU CAN HAVE EYE AND HIP SURGERIES- OKAY TO HOLD ASPIRIN FOR EACH. YOU WOULD BE INTERMEDIATE RISK FOR THE SURGERIES.    Studies Ordered:   Orders Placed This Encounter  Procedures  . Lipid panel  . Comprehensive metabolic panel      Bryan Lemma, M.D., M.S. Interventional Cardiologist   Pager # [phone] Phone # [phone] [address] [place], [place] [address]   Thank you for choosing Heartcare at [facility]!!

## 2018-07-25 ENCOUNTER — Encounter: Payer: Self-pay | Admitting: Cardiology

## 2018-07-25 MED ORDER — ASPIRIN EC 81 MG PO TBEC: 81.0000 mg | DELAYED_RELEASE_TABLET | Freq: Every day | ORAL | 3 refills | Status: AC

## 2018-07-25 NOTE — Assessment & Plan Note (Signed)
Alexander Pennington has insulin-dependent diabetes mellitus, type II and history of stroke.  He has normal renal function, and no evidence of heart failure.  He does have coronary disease but no evidence of angina.  Blepharoplasty is a low risk surgery which he should be able do fine without any changes to his medical regimen.  Hip replacement surgery is at least an intermediate release risk surgery, and with his existing risk factors he would be at least an intermediate risk patient for pretty much any surgery.  He does not tolerate beta-blockers so we could not use this for protection nor can we use calcium channel blockers at this point.  May consider diltiazem in the future.  As for doing any further cardiac evaluation, I do not think there is any need to because he is not having symptoms.  We are likely not can see anything improved, if not possibly even worse from a stress test or heart catheterization standpoint.  However in the absence of symptoms I would not advocate further testing.  His echocardiogram shows normal function and therefore I have no reason to believe that he had chronic ischemia.  Would be preferable to proceed with his operation and then if necessary address his coronary disease as opposed to doing at the opposite direction.  Daily which show that in the absence of symptoms, no additional testing is needed, as it would not change my current plan of treatment.  He did fine in 2015 having surgery with no adverse cardiac events.    Delaying surgery would not help because he will continue to be delayed as far as physical recovery.  This only serves to worsen his overall outcome.  Recommendation: Okay to hold aspirin 7 to 10 days preop.  Okay to go to surgery without further cardiac evaluation.  Intermediate risk patient for whatever surgery.

## 2018-07-25 NOTE — Assessment & Plan Note (Signed)
Cannot tell if I am hearing carotid bruits or radiated murmur.  Also with reduced radial pulses we will check carotid artery Dopplers which will also allow [place] to evaluate subclavian arteries.

## 2018-07-25 NOTE — Assessment & Plan Note (Signed)
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  This brings the importance of blood pressure control and avoiding dehydration.

## 2018-07-25 NOTE — Assessment & Plan Note (Signed)
I think he just has it in his mind that he would not be a take any cholesterol medicines.  He is claiming that his left arm injury is related to Livalo.  At this point I do not think he will take any statins.  We will recheck lipid panel and have him return to [facility] to discuss starting PCSK9 inhibitor with our clinical pharmacist team.  He seems to be resolved that he will try PCSK9-I.

## 2018-07-25 NOTE — Assessment & Plan Note (Signed)
Blood pressure is high today.  Will increase to 160 mg twice daily valsartan.  Apparently he did not do well with the high dose bolus having hypotension episodes.  We therefore split the dose of 320 mg in half and do it twice daily. He claimed intolerance to amlodipine which would be another option going forward.  We could also potentially consider trying Bystolic given his CAD.  Probably would not need diuretic as he is now on Invokana and not really noting any edema now.

## 2018-07-25 NOTE — Assessment & Plan Note (Signed)
Moderate to severe coronary disease noted on cath 4 years ago with no active anginal symptoms to speak of.  Hyperdynamic, preserved LVEF on echo would suggest that he has not had any effect of chronic ischemic disease.  At this point I do not think any additional testing is required.  Stress test would probably be abnormal which would then need to another heart catheterization and therefore another discussion about what to do with his coronary disease that has been stable.  He is on aspirin, but it could be held for surgery is (I do not think 10 days is necessary based on platelet lifespan.  7 days would be adequate --but this is irrelevant and would be fine for 7 to 10 days.).  As for medical management he is on co-every 10 and we will get him back to [facility] to discuss PCSK9 inhibitor since he is not taking statin. Not on beta-blocker partially because of his hypoglycemic concerns but also intolerance.  He is back on ARB which we will titrate up. No requirement for nitrate in the absence of any anginal symptoms.

## 2018-07-25 NOTE — Assessment & Plan Note (Signed)
He does well with his CPAP now that he has nasal pillows.  Bleeding and nighttime oxygen.  Just need to make sure that he has follow-up for parts and equipment etc.

## 2018-07-29 ENCOUNTER — Encounter (HOSPITAL_COMMUNITY): Payer: BLUE CROSS/BLUE SHIELD

## 2018-07-30 ENCOUNTER — Telehealth: Payer: Self-pay | Admitting: *Deleted

## 2018-07-30 ENCOUNTER — Ambulatory Visit (HOSPITAL_COMMUNITY)
Admission: RE | Admit: 2018-07-30 | Discharge: 2018-07-30 | Disposition: A | Discharge status: Home / Self Care | Payer: BLUE CROSS/BLUE SHIELD | Source: Ambulatory Visit | Attending: Cardiology | Admitting: Cardiology

## 2018-07-30 DIAGNOSIS — I2510 Atherosclerotic heart disease of native coronary artery without angina pectoris: Secondary | ICD-10-CM | POA: Diagnosis not present

## 2018-07-30 DIAGNOSIS — E1169 Type 2 diabetes mellitus with other specified complication: Secondary | ICD-10-CM | POA: Diagnosis not present

## 2018-07-30 DIAGNOSIS — R0989 Other specified symptoms and signs involving the circulatory and respiratory systems: Secondary | ICD-10-CM

## 2018-07-30 DIAGNOSIS — E785 Hyperlipidemia, unspecified: Secondary | ICD-10-CM | POA: Diagnosis not present

## 2018-07-30 LAB — COMPREHENSIVE METABOLIC PANEL
ALBUMIN: 4.9 g/dL (ref 3.5–5.5)
ALT: 20 IU/L (ref 0–44)
AST: 20 IU/L (ref 0–40)
Albumin/Globulin Ratio: 2.2 (ref 1.2–2.2)
Alkaline Phosphatase: 78 IU/L (ref 39–117)
BUN/Creatinine Ratio: 28 — ABNORMAL HIGH (ref 9–20)
BUN: 18 mg/dL (ref 6–24)
Bilirubin Total: 0.5 mg/dL (ref 0.0–1.2)
CALCIUM: 10.2 mg/dL (ref 8.7–10.2)
CO2: 22 mmol/L (ref 20–29)
CREATININE: 0.64 mg/dL — AB (ref 0.76–1.27)
Chloride: 98 mmol/L (ref 96–106)
GFR calc Af Amer: 124 mL/min/{1.73_m2} (ref >59)
GFR, EST NON AFRICAN AMERICAN: 107 mL/min/{1.73_m2} (ref >59)
GLOBULIN, TOTAL: 2.2 g/dL (ref 1.5–4.5)
GLUCOSE: 208 mg/dL — AB (ref 65–99)
Potassium: 4.8 mmol/L (ref 3.5–5.2)
SODIUM: 136 mmol/L (ref 134–144)
Total Protein: 7.1 g/dL (ref 6.0–8.5)

## 2018-07-30 LAB — LIPID PANEL
CHOLESTEROL TOTAL: 205 mg/dL — AB (ref 100–199)
Chol/HDL Ratio: 4.8 ratio (ref 0.0–5.0)
HDL: 43 mg/dL (ref >39)
LDL CALC: 130 mg/dL — AB (ref 0–99)
Triglycerides: 160 mg/dL — ABNORMAL HIGH (ref 0–149)
VLDL Cholesterol Cal: 32 mg/dL (ref 5–40)

## 2018-07-30 NOTE — Telephone Encounter (Signed)
[facility]ative from [facility] left a msg on the refill vm requesting a return call at [phone] in regards to the valsartan. The day after his last refill he listed that he has an allergy to this medication so they need to speak with someone from the office to verify okay to refill. Please provide reference number [id number] when calling. Thanks, MI

## 2018-07-30 NOTE — Telephone Encounter (Signed)
Returned call to [facility]-advised patient has been on valsartan, recently increased.     Advised ok to dispense.

## 2018-08-05 ENCOUNTER — Ambulatory Visit: Payer: BLUE CROSS/BLUE SHIELD

## 2018-08-11 ENCOUNTER — Other Ambulatory Visit: Payer: Self-pay | Admitting: *Deleted

## 2018-08-11 ENCOUNTER — Ambulatory Visit: Payer: BLUE CROSS/BLUE SHIELD

## 2018-08-11 NOTE — Telephone Encounter (Signed)
Per VM from patient deny refill on Tadalafil for now

## 2018-08-11 NOTE — Telephone Encounter (Signed)
Fax from [facility] - request RF on Tadalafil LMOVM to find out if pt is requesting RF, med is not on current med list. It was D/C'd on 12/16/17 w/ reason as Patient Preference.

## 2018-08-11 NOTE — Addendum Note (Signed)
Addended by: Julious PayerHOLT, Lorrain Rivers D on: 08/11/2018 03:58 PM   Modules accepted: Orders

## 2018-08-17 ENCOUNTER — Encounter: Payer: Self-pay | Admitting: Family Medicine

## 2018-08-18 ENCOUNTER — Ambulatory Visit (INDEPENDENT_AMBULATORY_CARE_PROVIDER_SITE_OTHER): Payer: BLUE CROSS/BLUE SHIELD | Admitting: Pharmacist Clinician (PhC)/ Clinical Pharmacy Specialist

## 2018-08-18 DIAGNOSIS — E1169 Type 2 diabetes mellitus with other specified complication: Secondary | ICD-10-CM

## 2018-08-18 DIAGNOSIS — E785 Hyperlipidemia, unspecified: Secondary | ICD-10-CM

## 2018-08-18 NOTE — Progress Notes (Signed)
08/20/2018 Alexander Pennington [birth year] [id number]   HPI:  Alexander Pennington is a 57 y.o. male patient of Dr Alexander Pennington, who presents today for a lipid clinic evaluation.  In addition to hyperlipidemia, his medical history is significant for CAD (heavily calcified LAD, > 50% calcification in OM1, AV groove and PDA), hemorrhagic stroke, hypertension, testosterone deficiency, DM (with nephropathy and neuropathy), obesity (lap band procedure in 2009) and sleep apnea.   In addition he suffers from Charcot foot and has had 3 toes amputated.    We had discussed the possibility of using PCSK-9 inhibitors in the past, however patient had no record of failure on two different statin drugs.   He notes today that his range of motion in his arms is much better now that he has been off Livalo for several months.  He is hopeful that he can have hip replacement surgery this month, if he can get his A1c around 7.0.    Current Medications:  none  Cholesterol Goals:  LDL < 70  Intolerant/previously tried:  Pravastatin and pitavastatin both caused myalgias, was unable to lift arms above shoulder  Family history: father had first MI at 63, CABG, one brother with valvular heart disase  Diet: eats out 1-2 times per week, fried chicken once monthly; lots of salad, fish, fresh vegetables.  Has protein shake and toast most mornings  Exercise: unable to do much due to toe amputations, gait problem after stroke   Labs:  07/30/18:  TC 205, TG 160, HDL 43, LDL 130  Current Outpatient Medications  Medication Sig Dispense Refill  . aspirin EC 81 MG tablet Take 1 tablet (81 mg total) by mouth daily. 90 tablet 3  . canagliflozin (INVOKANA) 300 MG TABS tablet Take 1 tablet (300 mg total) by mouth daily. 90 tablet 1  . celecoxib (CELEBREX) 200 MG capsule Take 1 capsule (200 mg total) by mouth 2 (two) times daily. 180 capsule 1  . Coenzyme Q10 200 MG capsule Take 1 capsule (200 mg total) by mouth daily.    [name] gabapentin (NEURONTIN) 600 MG  tablet TAKE 2 TABLETS THREE TIMES A DAY 540 tablet 3  . [START ON 08/25/2018] HYDROcodone-acetaminophen (NORCO) 5-325 MG tablet Take 1 tablet by mouth 2 (two) times daily after a meal. Take for moderate to severe pain 60 tablet 0  . insulin aspart (NOVOLOG) 100 UNIT/ML injection Inject 3 units before lunch and supper daily 3 vial 3  . Insulin Syringe-Needle U-100 28G X 5/16" 0.5 ML MISC Use to inject % units Novolog TID SubQ 270 each 1  . LORazepam (ATIVAN) 1 MG tablet Take 1 tablet (1 mg total) by mouth at bedtime as needed. 30 tablet 2  . metformin (GLUMETZA) 1000 MG (MOD) 24 hr tablet Take 1 tablet (1,000 mg total) by mouth 2 (two) times daily with a meal. 180 tablet 1  . Multiple Vitamins tablet Take 1 tablet by mouth daily.    . Testosterone (ANDROGEL PUMP) 20.25 MG/ACT (1.62%) GEL Place 4 Applicatorfuls onto the skin daily. 450 g 1  . traMADol (ULTRAM) 50 MG tablet Take 2 tablets (100 mg total) by mouth 3 (three) times daily. 180 tablet 2  . valsartan (DIOVAN) 160 MG tablet Take 1 tablet (160 mg total) by mouth 2 (two) times daily. 180 tablet 3  . Icosapent Ethyl (VASCEPA) 1 g CAPS Take 1 capsule by mouth 2 (two) times daily.     No current facility-administered medications for this visit.     Allergies  Allergen Reactions  . Beta Adrenergic Blockers Nausea And Vomiting  . Tetanus Toxoids     "Sore arm all the way down, Painful, red"  . Ciprofloxacin Other (See Comments)    Altered mental status  . Eggs Or Egg-Derived Products Diarrhea    Constant flu after flu shot.  . Labetalol Hcl Nausea Only  . Lansoprazole Other (See Comments)    Agitation  . Nsaids Other (See Comments)    Cannot take nsaids-  etc due to heart   . Other     All betablockers make him nauseated ("vertical vomiting")  . Statins Other (See Comments)    [place] SpanielSaid it causes pain?    Past Medical History:  Diagnosis Date  . CAD (coronary artery disease) 06/2014   Cath: Heavily calcified LAD with mild  diffuse  disease, Large d1 ~30&40%. LCx ok up to Large OM1 where there is ~70% focal disease in the AVG Cx, proxOM1 75%. Dominant RCA - mid 40% & 50% with moderate diffuse calcification, prior to PDA ~60%. EF 65-70% --> Recommended Med Cx (PCI if Sx warrant)  . Charcot foot due to diabetes mellitus (HCC)   . Complication of anesthesia    Pt. reports that he has done well the last several surgeries. hx deviated septum, sleep apnea- has had problems. Pt. reports that the tube was pulled out too soon, due to sleep apnea , decreasing sats, & when they tried to put it back it went into his sinus, this event occured with Lap Band surgery when he had a lot more weight on him   . Diabetes mellitus (HCC)    type 2  . Diabetic Charcot foot (HCC)   . Family history of anesthesia complication    mom with n/v  . Hemorrhagic stroke (HCC)    a. Dx 2010. 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 Unclear etiology. right eye damaged - has no central vision, has a little problem with balance  . Hyperlipidemia   . Hypertension   . Kidney stones   . Murmur, heart Fall 2014 - diagnosed  . Neuropathy   . Obesity    a. hx of lap band surgery.  . Sleep apnea    uses c-pap    Blood pressure (!) 158/86, pulse 92, resp. rate 16.   Hyperlipidemia associated with type 2 diabetes mellitus (HCC) Patient with CAD unable to tolerate two different statin drugs due to myalgias.  We reviewed the option of starting PCSK-9 inhibitor and patient is comfortable with trying this type of medication.  However, he wished to wait until after he gets a hip replacement.  His surgeon will not do this with his A1c > 7 so patient is hopeful that it will be low enough in about 2 weeks and he can get the surgery before the end of the year.  He will reach out to us in January and let us know when he is willing to start.     Phillips HayKristin Alvstad PharmD CPP [facility]

## 2018-08-20 ENCOUNTER — Encounter: Payer: Self-pay | Admitting: Pharmacist Clinician (PhC)/ Clinical Pharmacy Specialist

## 2018-08-20 NOTE — Assessment & Plan Note (Signed)
Patient with CAD unable to tolerate two different statin drugs due to myalgias.  We reviewed the option of starting PCSK-9 inhibitor and patient is comfortable with trying this type of medication.  However, he wished to wait until after he gets a hip replacement.  His surgeon will not do this with his A1c > 7 so patient is hopeful that it will be low enough in about 2 weeks and he can get the surgery before the end of the year.  He will reach out to us in January and let us know when he is willing to start.

## 2018-08-31 ENCOUNTER — Other Ambulatory Visit: Payer: Self-pay | Admitting: Family Medicine

## 2018-09-01 ENCOUNTER — Other Ambulatory Visit: Payer: Self-pay | Admitting: *Deleted

## 2018-09-01 DIAGNOSIS — E349 Endocrine disorder, unspecified: Secondary | ICD-10-CM

## 2018-09-01 DIAGNOSIS — M1612 Unilateral primary osteoarthritis, left hip: Secondary | ICD-10-CM | POA: Diagnosis not present

## 2018-09-01 MED ORDER — TESTOSTERONE 20.25 MG/ACT (1.62%) TD GEL: 4.0000 | Freq: Every day | TRANSDERMAL | 1 refills | Status: DC

## 2018-09-02 DIAGNOSIS — M87052 Idiopathic aseptic necrosis of left femur: Secondary | ICD-10-CM | POA: Diagnosis not present

## 2018-09-02 DIAGNOSIS — Z887 Allergy status to serum and vaccine status: Secondary | ICD-10-CM | POA: Diagnosis not present

## 2018-09-02 DIAGNOSIS — M1611 Unilateral primary osteoarthritis, right hip: Secondary | ICD-10-CM | POA: Diagnosis not present

## 2018-09-02 DIAGNOSIS — M25552 Pain in left hip: Secondary | ICD-10-CM | POA: Diagnosis not present

## 2018-09-02 DIAGNOSIS — Z888 Allergy status to other drugs, medicaments and biological substances status: Secondary | ICD-10-CM | POA: Diagnosis not present

## 2018-09-23 DIAGNOSIS — G8929 Other chronic pain: Secondary | ICD-10-CM | POA: Diagnosis not present

## 2018-09-23 DIAGNOSIS — Z7409 Other reduced mobility: Secondary | ICD-10-CM | POA: Diagnosis not present

## 2018-09-23 DIAGNOSIS — M25552 Pain in left hip: Secondary | ICD-10-CM | POA: Diagnosis not present

## 2018-09-23 DIAGNOSIS — M87052 Idiopathic aseptic necrosis of left femur: Secondary | ICD-10-CM | POA: Diagnosis not present

## 2018-09-24 DIAGNOSIS — M87052 Idiopathic aseptic necrosis of left femur: Secondary | ICD-10-CM | POA: Diagnosis not present

## 2018-09-24 DIAGNOSIS — I10 Essential (primary) hypertension: Secondary | ICD-10-CM | POA: Diagnosis not present

## 2018-09-26 ENCOUNTER — Other Ambulatory Visit: Payer: Self-pay | Admitting: Family Medicine

## 2018-09-26 DIAGNOSIS — E1142 Type 2 diabetes mellitus with diabetic polyneuropathy: Secondary | ICD-10-CM

## 2018-09-30 ENCOUNTER — Telehealth: Payer: Self-pay | Admitting: Family Medicine

## 2018-09-30 ENCOUNTER — Ambulatory Visit (INDEPENDENT_AMBULATORY_CARE_PROVIDER_SITE_OTHER): Payer: BLUE CROSS/BLUE SHIELD | Admitting: Family Medicine

## 2018-09-30 ENCOUNTER — Encounter: Payer: Self-pay | Admitting: Family Medicine

## 2018-09-30 ENCOUNTER — Ambulatory Visit (INDEPENDENT_AMBULATORY_CARE_PROVIDER_SITE_OTHER): Payer: BLUE CROSS/BLUE SHIELD

## 2018-09-30 VITALS — BP 139/70 | HR 80 | Temp 98.0°F | Ht 68.0 in | Wt 270.0 lb

## 2018-09-30 DIAGNOSIS — E118 Type 2 diabetes mellitus with unspecified complications: Secondary | ICD-10-CM | POA: Diagnosis not present

## 2018-09-30 DIAGNOSIS — Z01818 Encounter for other preprocedural examination: Secondary | ICD-10-CM | POA: Diagnosis not present

## 2018-09-30 DIAGNOSIS — Z794 Long term (current) use of insulin: Secondary | ICD-10-CM

## 2018-09-30 DIAGNOSIS — Z113 Encounter for screening for infections with a predominantly sexual mode of transmission: Secondary | ICD-10-CM

## 2018-09-30 DIAGNOSIS — R609 Edema, unspecified: Secondary | ICD-10-CM | POA: Diagnosis not present

## 2018-09-30 DIAGNOSIS — F1120 Opioid dependence, uncomplicated: Secondary | ICD-10-CM | POA: Diagnosis not present

## 2018-09-30 DIAGNOSIS — E1142 Type 2 diabetes mellitus with diabetic polyneuropathy: Secondary | ICD-10-CM

## 2018-09-30 DIAGNOSIS — E349 Endocrine disorder, unspecified: Secondary | ICD-10-CM

## 2018-09-30 DIAGNOSIS — E1121 Type 2 diabetes mellitus with diabetic nephropathy: Secondary | ICD-10-CM

## 2018-09-30 MED ORDER — METFORMIN HCL ER (MOD) 1000 MG PO TB24: 1000.0000 mg | ORAL_TABLET | Freq: Two times a day (BID) | ORAL | 1 refills | Status: DC

## 2018-09-30 MED ORDER — CANAGLIFLOZIN 300 MG PO TABS: 300.0000 mg | ORAL_TABLET | Freq: Every day | ORAL | 1 refills | Status: DC

## 2018-09-30 MED ORDER — INSULIN LISPRO 100 UNIT/ML ~~LOC~~ SOLN: SUBCUTANEOUS | 3 refills | Status: DC

## 2018-09-30 MED ORDER — TRAMADOL HCL 50 MG PO TABS: 100.0000 mg | ORAL_TABLET | Freq: Three times a day (TID) | ORAL | 0 refills | Status: DC

## 2018-09-30 MED ORDER — CELECOXIB 200 MG PO CAPS: 200.0000 mg | ORAL_CAPSULE | Freq: Two times a day (BID) | ORAL | 1 refills | Status: DC

## 2018-09-30 MED ORDER — INSULIN ASPART 100 UNIT/ML ~~LOC~~ SOLN: SUBCUTANEOUS | 3 refills | Status: DC

## 2018-09-30 MED ORDER — HYDROCODONE-ACETAMINOPHEN 5-325 MG PO TABS: 1.0000 | ORAL_TABLET | Freq: Two times a day (BID) | ORAL | 0 refills | Status: DC

## 2018-09-30 MED ORDER — LORAZEPAM 1 MG PO TABS: 1.0000 mg | ORAL_TABLET | Freq: Every evening | ORAL | 2 refills | Status: DC | PRN

## 2018-09-30 NOTE — Progress Notes (Signed)
Subjective:  Patient ID: Alexander Pennington,  male    DOB: May 09, 1961  Age: 58 y.o.    CC: Medical Management of Chronic Issues   HPI Alexander Pennington presents for  follow-up of hypertension. Patient has no history of headache chest pain or shortness of breath or recent cough. Patient also denies symptoms of TIA such as numbness weakness lateralizing. Patient denies side effects from medication. States taking it regularly.  Patient also  in for follow-up of elevated cholesterol. Tried pravastatin with Cardiology recently. Had severe muscle spasms within two week. Now being considered for Alexander Pennington or Alexander Pennington through Cardiology's PharmD. Follow-up of diabetes. Patient does check blood sugar at home. Readings run between 28 and 100 recently. Has tightened diet to get A1c down low enough for left hip replacement surgery. Ortho did A1c last week and it was 7.2.  Patient denies symptoms such as excessive hunger or urinary frequency, excessive hunger, nausea No significant hypoglycemic spells noted. Medications reviewed. Pt reports taking them regularly. Pt. denies complication/adverse reaction today.    History Alexander Pennington has a past medical history of CAD (coronary artery disease) (06/2014), Charcot foot due to diabetes mellitus ([place]), Complication of anesthesia, Diabetes mellitus ([place]), Diabetic Charcot foot ([place]), Family history of anesthesia complication, Hemorrhagic stroke ([place]), Hyperlipidemia, Hypertension, Kidney stones, Murmur, heart (Fall 2014 - diagnosed), Neuropathy, Obesity, and Sleep apnea.   He has a past surgical history that includes lap band surgery; Foot surgery; Carpal tunnel release (Left); I&D extremity (Bilateral, 06/17/2014); Vasectomy; Colonoscopy; Amputation (Left, 07/06/2014); Hardware Removal (Left, 07/06/2014); left heart catheterization with coronary angiogram (N/A, 06/16/2014); Amputation (Left, 09/23/2014); I&D extremity (Right, 11/04/2014); NM MYOVIEW LTD (05/2014); transthoracic  echocardiogram (05/2014); and transthoracic echocardiogram (02/05/2018).   His family history includes CAD (age of onset: 45) in his father; Cancer - Colon in his brother; Healthy in his mother; Heart attack (age of onset: 49) in his father; Valvular heart disease (age of onset: 60) in his brother.He reports that he quit smoking about 30 years ago. His smoking use included cigarettes. He has never used smokeless tobacco. He reports current alcohol use. He reports that he does not use drugs.  Current Outpatient Medications on File Prior to Visit  Medication Sig Dispense Refill  . aspirin EC 81 MG tablet Take 1 tablet (81 mg total) by mouth daily. 90 tablet 3  . Coenzyme Q10 200 MG capsule Take 1 capsule (200 mg total) by mouth daily.    [name] gabapentin (NEURONTIN) 600 MG tablet TAKE 2 TABLETS THREE TIMES A DAY 540 tablet 3  . Insulin Syringe-Needle U-100 (B-D INSULIN SYRINGE) 31G X 5/16" 0.3 ML MISC USE TO INJECT NOVOLOG UNDER THE SKIN THREE TIMES A DAY AS DIRECTED BY YOUR DOCTOR 270 each 4  . Multiple Vitamins tablet Take 1 tablet by mouth daily.    . Testosterone (ANDROGEL PUMP) 20.25 MG/ACT (1.62%) GEL Place 4 Applicatorfuls onto the skin daily. 450 g 1  . valsartan (DIOVAN) 160 MG tablet Take 1 tablet (160 mg total) by mouth 2 (two) times daily. 180 tablet 3   No current facility-administered medications on file prior to visit.     ROS Review of Systems  Constitutional: Negative.   HENT: Negative.   Eyes: Negative for visual disturbance.  Respiratory: Negative for cough and shortness of breath.   Cardiovascular: Negative for chest pain and leg swelling.  Gastrointestinal: Negative for abdominal pain, diarrhea, nausea and vomiting.  Genitourinary: Negative for difficulty urinating.  Musculoskeletal: Positive for arthralgias and myalgias.  Skin: Negative for rash.  Neurological: Negative for headaches.       Peripheral neuropathy pain stable  Psychiatric/Behavioral: Negative for sleep  disturbance.    Objective:  BP 139/70   Pulse 80   Temp 98 F (36.7 C) (Oral)   Ht '5\' 8"'  (1.727 m)   Wt 270 lb (122.5 kg)   BMI 41.05 kg/m   BP Readings from Last 3 Encounters:  09/30/18 139/70  08/18/18 (!) 158/86  07/23/18 (!) 160/78    Wt Readings from Last 3 Encounters:  09/30/18 270 lb (122.5 kg)  07/23/18 265 lb (120.2 kg)  06/26/18 265 lb (120.2 kg)     Physical Exam Constitutional:      General: He is not in acute distress.    Appearance: He is well-developed.  HENT:     Head: Normocephalic and atraumatic.     Right Ear: External ear normal.     Left Ear: External ear normal.     Nose: Nose normal.  Eyes:     Conjunctiva/sclera: Conjunctivae normal.     Pupils: Pupils are equal, round, and reactive to light.  Neck:     Musculoskeletal: Normal range of motion and neck supple.  Cardiovascular:     Rate and Rhythm: Normal rate and regular rhythm.     Heart sounds: Normal heart sounds. No murmur.  Pulmonary:     Effort: Pulmonary effort is normal. No respiratory distress.     Breath sounds: Normal breath sounds. No wheezing or rales.  Abdominal:     Palpations: Abdomen is soft.     Tenderness: There is no abdominal tenderness.  Musculoskeletal: Normal range of motion.  Skin:    General: Skin is warm and dry.  Neurological:     Mental Status: He is alert and oriented to person, place, and time.     Deep Tendon Reflexes: Reflexes are normal and symmetric.  Psychiatric:        Behavior: Behavior normal.        Thought Content: Thought content normal.        Judgment: Judgment normal.         Assessment & Plan:   Alexander Pennington was seen today for medical management of chronic issues.  Diagnoses and all orders for this visit:  Type 2 diabetes mellitus with complication, with long-term current use of insulin (HCC) -     CBC with Differential/Platelet -     CMP14+EGFR -     metFORMIN (GLUMETZA) 1000 MG (MOD) 24 hr tablet; Take 1 tablet (1,000 mg total)  by mouth 2 (two) times daily with a meal.  Testosterone deficiency -     Testosterone,Free and Total  Uncomplicated opioid dependence ([place]) -     ToxASSURE Select 13 (MW), Urine  Edema, unspecified type -     DG Chest 2 View; Future  Screening for STD (sexually transmitted disease) -     Hepatitis C antibody -     HIV Antibody (routine testing w rflx) -     RPR -     HSV 1 AND 2 IGM ABS, INDIRECT -     GC/Chlamydia Probe Amp  Diabetic nephropathy associated with type 2 diabetes mellitus (HCC) -     metFORMIN (GLUMETZA) 1000 MG (MOD) 24 hr tablet; Take 1 tablet (1,000 mg total) by mouth 2 (two) times daily with a meal.  Diabetic peripheral neuropathy associated with type 2 diabetes mellitus (HCC) -     metFORMIN (GLUMETZA) 1000 MG (MOD) 24  hr tablet; Take 1 tablet (1,000 mg total) by mouth 2 (two) times daily with a meal. -     canagliflozin (INVOKANA) 300 MG TABS tablet; Take 1 tablet (300 mg total) by mouth daily. -     traMADol (ULTRAM) 50 MG tablet; Take 2 tablets (100 mg total) by mouth 3 (three) times daily.  Other orders -     celecoxib (CELEBREX) 200 MG capsule; Take 1 capsule (200 mg total) by mouth 2 (two) times daily. -     insulin lispro (HUMALOG) 100 UNIT/ML injection; INJECT 5 UNITS UNDER THE SKIN THREE TIMES A DAY BEFORE MEALS -     insulin aspart (NOVOLOG) 100 UNIT/ML injection; Inject 3 units before lunch and supper daily -     HYDROcodone-acetaminophen (NORCO) 5-325 MG tablet; Take 1 tablet by mouth 2 (two) times daily after a meal for 30 days. Take for moderate to severe pain -     traMADol (ULTRAM) 50 MG tablet; Take 2 tablets (100 mg total) by mouth 3 (three) times daily for 30 days. -     LORazepam (ATIVAN) 1 MG tablet; Take 1 tablet (1 mg total) by mouth at bedtime as needed.   I have discontinued Alexander Pennington. Schurman's Icosapent Ethyl. I have changed his HUMALOG to insulin lispro. I have also changed his HYDROcodone-acetaminophen. Additionally, I am having him start  on traMADol. Lastly, I am having him maintain his Multiple Vitamins, Coenzyme Q10, gabapentin, valsartan, aspirin EC, Insulin Syringe-Needle U-100, Testosterone, metFORMIN, canagliflozin, celecoxib, insulin aspart, traMADol, and LORazepam.  Meds ordered this encounter  Medications  . metFORMIN (GLUMETZA) 1000 MG (MOD) 24 hr tablet    Sig: Take 1 tablet (1,000 mg total) by mouth 2 (two) times daily with a meal.    Dispense:  180 tablet    Refill:  1  . canagliflozin (INVOKANA) 300 MG TABS tablet    Sig: Take 1 tablet (300 mg total) by mouth daily.    Dispense:  90 tablet    Refill:  1  . celecoxib (CELEBREX) 200 MG capsule    Sig: Take 1 capsule (200 mg total) by mouth 2 (two) times daily.    Dispense:  180 capsule    Refill:  1  . insulin lispro (HUMALOG) 100 UNIT/ML injection    Sig: INJECT 5 UNITS UNDER THE SKIN THREE TIMES A DAY BEFORE MEALS    Dispense:  30 mL    Refill:  3  . insulin aspart (NOVOLOG) 100 UNIT/ML injection    Sig: Inject 3 units before lunch and supper daily    Dispense:  3 vial    Refill:  3  . HYDROcodone-acetaminophen (NORCO) 5-325 MG tablet    Sig: Take 1 tablet by mouth 2 (two) times daily after a meal for 30 days. Take for moderate to severe pain    Dispense:  60 tablet    Refill:  0  . traMADol (ULTRAM) 50 MG tablet    Sig: Take 2 tablets (100 mg total) by mouth 3 (three) times daily.    Dispense:  540 tablet    Refill:  0  . traMADol (ULTRAM) 50 MG tablet    Sig: Take 2 tablets (100 mg total) by mouth 3 (three) times daily for 30 days.    Dispense:  180 tablet    Refill:  0  . LORazepam (ATIVAN) 1 MG tablet    Sig: Take 1 tablet (1 mg total) by mouth at bedtime as needed.    Dispense:  30 tablet    Refill:  2     Follow-up: Return in about 3 months (around 12/30/2018).  Claretta Fraise, M.D.

## 2018-09-30 NOTE — Telephone Encounter (Signed)
Talked to [place]

## 2018-10-01 DIAGNOSIS — Z794 Long term (current) use of insulin: Secondary | ICD-10-CM | POA: Diagnosis not present

## 2018-10-01 DIAGNOSIS — E118 Type 2 diabetes mellitus with unspecified complications: Secondary | ICD-10-CM | POA: Diagnosis not present

## 2018-10-01 DIAGNOSIS — E349 Endocrine disorder, unspecified: Secondary | ICD-10-CM | POA: Diagnosis not present

## 2018-10-01 DIAGNOSIS — Z113 Encounter for screening for infections with a predominantly sexual mode of transmission: Secondary | ICD-10-CM | POA: Diagnosis not present

## 2018-10-01 LAB — GC/CHLAMYDIA PROBE AMP
Chlamydia trachomatis, NAA: NEGATIVE
Neisseria gonorrhoeae by PCR: NEGATIVE

## 2018-10-05 LAB — CMP14+EGFR
A/G RATIO: 1.8 (ref 1.2–2.2)
ALT: 17 IU/L (ref 0–44)
AST: 16 IU/L (ref 0–40)
Albumin: 4.4 g/dL (ref 3.5–5.5)
Alkaline Phosphatase: 74 IU/L (ref 39–117)
BUN/Creatinine Ratio: 31 — ABNORMAL HIGH (ref 9–20)
BUN: 21 mg/dL (ref 6–24)
Bilirubin Total: 0.5 mg/dL (ref 0.0–1.2)
CALCIUM: 9.4 mg/dL (ref 8.7–10.2)
CO2: 22 mmol/L (ref 20–29)
Chloride: 94 mmol/L — ABNORMAL LOW (ref 96–106)
Creatinine, Ser: 0.68 mg/dL — ABNORMAL LOW (ref 0.76–1.27)
GFR calc Af Amer: 121 mL/min/{1.73_m2} (ref >59)
GFR, EST NON AFRICAN AMERICAN: 105 mL/min/{1.73_m2} (ref >59)
Globulin, Total: 2.5 g/dL (ref 1.5–4.5)
Glucose: 329 mg/dL — ABNORMAL HIGH (ref 65–99)
POTASSIUM: 5 mmol/L (ref 3.5–5.2)
Sodium: 130 mmol/L — ABNORMAL LOW (ref 134–144)
Total Protein: 6.9 g/dL (ref 6.0–8.5)

## 2018-10-05 LAB — CBC WITH DIFFERENTIAL/PLATELET
Basophils Absolute: 0.1 10*3/uL (ref 0.0–0.2)
Basos: 1 %
EOS (ABSOLUTE): 0.2 10*3/uL (ref 0.0–0.4)
Eos: 3 %
Hematocrit: 43.4 % (ref 37.5–51.0)
Hemoglobin: 15.3 g/dL (ref 13.0–17.7)
Immature Grans (Abs): 0 10*3/uL (ref 0.0–0.1)
Immature Granulocytes: 0 %
LYMPHS: 18 %
Lymphocytes Absolute: 1.1 10*3/uL (ref 0.7–3.1)
MCH: 31.9 pg (ref 26.6–33.0)
MCHC: 35.3 g/dL (ref 31.5–35.7)
MCV: 90 fL (ref 79–97)
Monocytes Absolute: 0.5 10*3/uL (ref 0.1–0.9)
Monocytes: 8 %
Neutrophils Absolute: 4.4 10*3/uL (ref 1.4–7.0)
Neutrophils: 70 %
Platelets: 203 10*3/uL (ref 150–450)
RBC: 4.8 x10E6/uL (ref 4.14–5.80)
RDW: 11.8 % (ref 11.6–15.4)
WBC: 6.3 10*3/uL (ref 3.4–10.8)

## 2018-10-05 LAB — TESTOSTERONE,FREE AND TOTAL
Testosterone, Free: 10.3 pg/mL (ref 7.2–24.0)
Testosterone: 442 ng/dL (ref 264–916)

## 2018-10-05 LAB — HEPATITIS C ANTIBODY: Hep C Virus Ab: <0.1 s/co ratio (ref 0.0–0.9)

## 2018-10-05 LAB — HSV 1 AND 2 IGM ABS, INDIRECT
HSV 1 IgM: <1:10 {titer}
HSV 2 IgM: <1:10 {titer}

## 2018-10-05 LAB — RPR: RPR Ser Ql: NONREACTIVE

## 2018-10-05 LAB — HIV ANTIBODY (ROUTINE TESTING W REFLEX): HIV Screen 4th Generation wRfx: NONREACTIVE

## 2018-10-06 ENCOUNTER — Encounter: Payer: Self-pay | Admitting: Family Medicine

## 2018-10-06 LAB — OSMOLALITY: Osmolality Meas: 320 mOsmol/kg — ABNORMAL HIGH (ref 275–295)

## 2018-10-06 LAB — TOXASSURE SELECT 13 (MW), URINE

## 2018-10-06 NOTE — Telephone Encounter (Signed)
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

## 2018-10-07 ENCOUNTER — Encounter: Payer: Self-pay | Admitting: Family Medicine

## 2018-10-13 DIAGNOSIS — Z79891 Long term (current) use of opiate analgesic: Secondary | ICD-10-CM | POA: Diagnosis not present

## 2018-10-13 DIAGNOSIS — Z6841 Body Mass Index (BMI) 40.0 and over, adult: Secondary | ICD-10-CM | POA: Diagnosis not present

## 2018-10-13 DIAGNOSIS — E119 Type 2 diabetes mellitus without complications: Secondary | ICD-10-CM | POA: Diagnosis not present

## 2018-10-13 DIAGNOSIS — Z471 Aftercare following joint replacement surgery: Secondary | ICD-10-CM | POA: Diagnosis not present

## 2018-10-13 DIAGNOSIS — G8918 Other acute postprocedural pain: Secondary | ICD-10-CM | POA: Diagnosis not present

## 2018-10-13 DIAGNOSIS — Z8673 Personal history of transient ischemic attack (TIA), and cerebral infarction without residual deficits: Secondary | ICD-10-CM | POA: Diagnosis not present

## 2018-10-13 DIAGNOSIS — Z96642 Presence of left artificial hip joint: Secondary | ICD-10-CM | POA: Diagnosis not present

## 2018-10-13 DIAGNOSIS — I2510 Atherosclerotic heart disease of native coronary artery without angina pectoris: Secondary | ICD-10-CM | POA: Diagnosis not present

## 2018-10-13 DIAGNOSIS — K219 Gastro-esophageal reflux disease without esophagitis: Secondary | ICD-10-CM | POA: Diagnosis not present

## 2018-10-13 DIAGNOSIS — G4733 Obstructive sleep apnea (adult) (pediatric): Secondary | ICD-10-CM | POA: Diagnosis not present

## 2018-10-13 DIAGNOSIS — M1612 Unilateral primary osteoarthritis, left hip: Secondary | ICD-10-CM | POA: Diagnosis not present

## 2018-10-13 DIAGNOSIS — Z79899 Other long term (current) drug therapy: Secondary | ICD-10-CM | POA: Diagnosis not present

## 2018-10-13 DIAGNOSIS — I110 Hypertensive heart disease with heart failure: Secondary | ICD-10-CM | POA: Diagnosis not present

## 2018-10-13 DIAGNOSIS — G4700 Insomnia, unspecified: Secondary | ICD-10-CM | POA: Diagnosis not present

## 2018-10-13 DIAGNOSIS — D62 Acute posthemorrhagic anemia: Secondary | ICD-10-CM | POA: Diagnosis not present

## 2018-10-13 DIAGNOSIS — Z7982 Long term (current) use of aspirin: Secondary | ICD-10-CM | POA: Diagnosis not present

## 2018-10-13 DIAGNOSIS — M87052 Idiopathic aseptic necrosis of left femur: Secondary | ICD-10-CM | POA: Diagnosis not present

## 2018-10-13 DIAGNOSIS — I272 Pulmonary hypertension, unspecified: Secondary | ICD-10-CM | POA: Diagnosis not present

## 2018-10-13 DIAGNOSIS — Z87891 Personal history of nicotine dependence: Secondary | ICD-10-CM | POA: Diagnosis not present

## 2018-10-14 DIAGNOSIS — Z79891 Long term (current) use of opiate analgesic: Secondary | ICD-10-CM | POA: Diagnosis not present

## 2018-10-14 DIAGNOSIS — M87052 Idiopathic aseptic necrosis of left femur: Secondary | ICD-10-CM | POA: Diagnosis not present

## 2018-10-14 DIAGNOSIS — Z79899 Other long term (current) drug therapy: Secondary | ICD-10-CM | POA: Diagnosis not present

## 2018-10-20 DIAGNOSIS — Z7409 Other reduced mobility: Secondary | ICD-10-CM | POA: Diagnosis not present

## 2018-10-20 DIAGNOSIS — M87052 Idiopathic aseptic necrosis of left femur: Secondary | ICD-10-CM | POA: Diagnosis not present

## 2018-10-20 DIAGNOSIS — G8929 Other chronic pain: Secondary | ICD-10-CM | POA: Diagnosis not present

## 2018-10-20 DIAGNOSIS — M25552 Pain in left hip: Secondary | ICD-10-CM | POA: Diagnosis not present

## 2018-10-27 DIAGNOSIS — M87052 Idiopathic aseptic necrosis of left femur: Secondary | ICD-10-CM | POA: Diagnosis not present

## 2018-10-27 DIAGNOSIS — Z7409 Other reduced mobility: Secondary | ICD-10-CM | POA: Diagnosis not present

## 2018-10-27 DIAGNOSIS — M25552 Pain in left hip: Secondary | ICD-10-CM | POA: Diagnosis not present

## 2018-10-27 DIAGNOSIS — G8929 Other chronic pain: Secondary | ICD-10-CM | POA: Diagnosis not present

## 2018-10-28 ENCOUNTER — Encounter: Payer: Self-pay | Admitting: Family Medicine

## 2018-10-30 ENCOUNTER — Encounter: Payer: Self-pay | Admitting: Family Medicine

## 2018-10-30 ENCOUNTER — Other Ambulatory Visit: Payer: Self-pay | Admitting: Family Medicine

## 2018-10-30 DIAGNOSIS — M87052 Idiopathic aseptic necrosis of left femur: Secondary | ICD-10-CM | POA: Diagnosis not present

## 2018-10-30 DIAGNOSIS — G8929 Other chronic pain: Secondary | ICD-10-CM | POA: Diagnosis not present

## 2018-10-30 DIAGNOSIS — M25552 Pain in left hip: Secondary | ICD-10-CM | POA: Diagnosis not present

## 2018-10-30 DIAGNOSIS — Z7409 Other reduced mobility: Secondary | ICD-10-CM | POA: Diagnosis not present

## 2018-10-31 ENCOUNTER — Other Ambulatory Visit: Payer: Self-pay | Admitting: Family Medicine

## 2018-10-31 MED ORDER — TADALAFIL 20 MG PO TABS: 20.0000 mg | ORAL_TABLET | ORAL | 0 refills | Status: DC | PRN

## 2018-11-02 ENCOUNTER — Other Ambulatory Visit: Payer: Self-pay | Admitting: Family Medicine

## 2018-11-02 ENCOUNTER — Telehealth: Payer: Self-pay | Admitting: Family Medicine

## 2018-11-02 DIAGNOSIS — G8929 Other chronic pain: Secondary | ICD-10-CM | POA: Diagnosis not present

## 2018-11-02 DIAGNOSIS — Z794 Long term (current) use of insulin: Secondary | ICD-10-CM

## 2018-11-02 DIAGNOSIS — Z7409 Other reduced mobility: Secondary | ICD-10-CM | POA: Diagnosis not present

## 2018-11-02 DIAGNOSIS — E349 Endocrine disorder, unspecified: Secondary | ICD-10-CM

## 2018-11-02 DIAGNOSIS — E1121 Type 2 diabetes mellitus with diabetic nephropathy: Secondary | ICD-10-CM

## 2018-11-02 DIAGNOSIS — M87052 Idiopathic aseptic necrosis of left femur: Secondary | ICD-10-CM | POA: Diagnosis not present

## 2018-11-02 DIAGNOSIS — M25552 Pain in left hip: Secondary | ICD-10-CM | POA: Diagnosis not present

## 2018-11-02 DIAGNOSIS — E118 Type 2 diabetes mellitus with unspecified complications: Secondary | ICD-10-CM

## 2018-11-02 DIAGNOSIS — E1142 Type 2 diabetes mellitus with diabetic polyneuropathy: Secondary | ICD-10-CM

## 2018-11-02 MED ORDER — METFORMIN HCL ER (MOD) 1000 MG PO TB24: 1000.0000 mg | ORAL_TABLET | Freq: Two times a day (BID) | ORAL | 1 refills | Status: DC

## 2018-11-02 MED ORDER — CANAGLIFLOZIN 300 MG PO TABS: 300.0000 mg | ORAL_TABLET | Freq: Every day | ORAL | 1 refills | Status: DC

## 2018-11-02 MED ORDER — VALSARTAN 160 MG PO TABS: 160.0000 mg | ORAL_TABLET | Freq: Two times a day (BID) | ORAL | 3 refills | Status: DC

## 2018-11-02 MED ORDER — INSULIN LISPRO 100 UNIT/ML ~~LOC~~ SOLN: SUBCUTANEOUS | 3 refills | Status: DC

## 2018-11-02 MED ORDER — CELECOXIB 200 MG PO CAPS: 200.0000 mg | ORAL_CAPSULE | Freq: Two times a day (BID) | ORAL | 1 refills | Status: DC

## 2018-11-02 MED ORDER — INSULIN ASPART 100 UNIT/ML ~~LOC~~ SOLN: SUBCUTANEOUS | 3 refills | Status: DC

## 2018-11-02 MED ORDER — "INSULIN SYRINGE-NEEDLE U-100 31G X 5/16"" 0.3 ML MISC": 4 refills | Status: DC

## 2018-11-02 NOTE — Telephone Encounter (Signed)
Please advise when back in the office on Tuesday 11/03/2018

## 2018-11-03 ENCOUNTER — Other Ambulatory Visit: Payer: Self-pay | Admitting: Family Medicine

## 2018-11-03 MED ORDER — SILDENAFIL CITRATE 20 MG PO TABS: ORAL_TABLET | ORAL | 1 refills | Status: DC

## 2018-11-03 MED ORDER — TADALAFIL 20 MG PO TABS: 20.0000 mg | ORAL_TABLET | ORAL | 0 refills | Status: DC | PRN

## 2018-11-03 NOTE — Telephone Encounter (Signed)
Patient aware.  Please send

## 2018-11-03 NOTE — Telephone Encounter (Signed)
HE alternates them

## 2018-11-03 NOTE — Telephone Encounter (Signed)
Sildenafil and tadalafil are listed .  Does he take both ?  Please send in if appropriate to [facility],  [employer] , [place].

## 2018-11-03 NOTE — Telephone Encounter (Signed)
Please change all except the controlled drugs. That can only be done at an office visit.

## 2018-11-05 ENCOUNTER — Other Ambulatory Visit: Payer: Self-pay | Admitting: Family Medicine

## 2018-11-05 DIAGNOSIS — E1142 Type 2 diabetes mellitus with diabetic polyneuropathy: Secondary | ICD-10-CM

## 2018-11-06 DIAGNOSIS — Z7409 Other reduced mobility: Secondary | ICD-10-CM | POA: Diagnosis not present

## 2018-11-06 DIAGNOSIS — M25552 Pain in left hip: Secondary | ICD-10-CM | POA: Diagnosis not present

## 2018-11-06 DIAGNOSIS — G8929 Other chronic pain: Secondary | ICD-10-CM | POA: Diagnosis not present

## 2018-11-06 DIAGNOSIS — M87052 Idiopathic aseptic necrosis of left femur: Secondary | ICD-10-CM | POA: Diagnosis not present

## 2018-11-09 ENCOUNTER — Other Ambulatory Visit: Payer: Self-pay | Admitting: Family Medicine

## 2018-11-09 DIAGNOSIS — Z7409 Other reduced mobility: Secondary | ICD-10-CM | POA: Diagnosis not present

## 2018-11-09 DIAGNOSIS — G8929 Other chronic pain: Secondary | ICD-10-CM | POA: Diagnosis not present

## 2018-11-09 DIAGNOSIS — M87052 Idiopathic aseptic necrosis of left femur: Secondary | ICD-10-CM | POA: Diagnosis not present

## 2018-11-09 DIAGNOSIS — E1142 Type 2 diabetes mellitus with diabetic polyneuropathy: Secondary | ICD-10-CM

## 2018-11-09 DIAGNOSIS — M25552 Pain in left hip: Secondary | ICD-10-CM | POA: Diagnosis not present

## 2018-11-11 ENCOUNTER — Other Ambulatory Visit: Payer: Self-pay | Admitting: Family Medicine

## 2018-11-11 DIAGNOSIS — E1142 Type 2 diabetes mellitus with diabetic polyneuropathy: Secondary | ICD-10-CM

## 2018-11-12 ENCOUNTER — Telehealth: Payer: Self-pay | Admitting: Family Medicine

## 2018-11-13 DIAGNOSIS — M4328 Fusion of spine, sacral and sacrococcygeal region: Secondary | ICD-10-CM | POA: Diagnosis not present

## 2018-11-13 DIAGNOSIS — M1612 Unilateral primary osteoarthritis, left hip: Secondary | ICD-10-CM | POA: Diagnosis not present

## 2018-11-13 DIAGNOSIS — Z96642 Presence of left artificial hip joint: Secondary | ICD-10-CM | POA: Diagnosis not present

## 2018-11-13 DIAGNOSIS — M25552 Pain in left hip: Secondary | ICD-10-CM | POA: Diagnosis not present

## 2018-11-13 NOTE — Telephone Encounter (Signed)
Pt aware he needs to call [facility] to get his 90 day supply of Tramadol shipped out. Pt voiced understanding.

## 2018-11-17 DIAGNOSIS — T8189XA Other complications of procedures, not elsewhere classified, initial encounter: Secondary | ICD-10-CM | POA: Diagnosis not present

## 2018-11-17 DIAGNOSIS — Y838 Other surgical procedures as the cause of abnormal reaction of the patient, or of later complication, without mention of misadventure at the time of the procedure: Secondary | ICD-10-CM | POA: Diagnosis not present

## 2018-11-17 DIAGNOSIS — T8131XA Disruption of external operation (surgical) wound, not elsewhere classified, initial encounter: Secondary | ICD-10-CM | POA: Diagnosis not present

## 2018-11-17 DIAGNOSIS — E119 Type 2 diabetes mellitus without complications: Secondary | ICD-10-CM | POA: Diagnosis not present

## 2018-11-17 DIAGNOSIS — Z794 Long term (current) use of insulin: Secondary | ICD-10-CM | POA: Diagnosis not present

## 2018-11-17 DIAGNOSIS — S31104A Unspecified open wound of abdominal wall, left lower quadrant without penetration into peritoneal cavity, initial encounter: Secondary | ICD-10-CM | POA: Diagnosis not present

## 2018-11-18 DIAGNOSIS — L97509 Non-pressure chronic ulcer of other part of unspecified foot with unspecified severity: Secondary | ICD-10-CM | POA: Diagnosis not present

## 2018-11-19 DIAGNOSIS — M87052 Idiopathic aseptic necrosis of left femur: Secondary | ICD-10-CM | POA: Diagnosis not present

## 2018-11-19 DIAGNOSIS — Z7409 Other reduced mobility: Secondary | ICD-10-CM | POA: Diagnosis not present

## 2018-11-19 DIAGNOSIS — M25552 Pain in left hip: Secondary | ICD-10-CM | POA: Diagnosis not present

## 2018-11-24 DIAGNOSIS — Z7409 Other reduced mobility: Secondary | ICD-10-CM | POA: Diagnosis not present

## 2018-11-24 DIAGNOSIS — S71002D Unspecified open wound, left hip, subsequent encounter: Secondary | ICD-10-CM | POA: Diagnosis not present

## 2018-11-24 DIAGNOSIS — M25552 Pain in left hip: Secondary | ICD-10-CM | POA: Diagnosis not present

## 2018-11-24 DIAGNOSIS — M87052 Idiopathic aseptic necrosis of left femur: Secondary | ICD-10-CM | POA: Diagnosis not present

## 2018-11-24 DIAGNOSIS — Z96642 Presence of left artificial hip joint: Secondary | ICD-10-CM | POA: Diagnosis not present

## 2018-11-26 DIAGNOSIS — M25552 Pain in left hip: Secondary | ICD-10-CM | POA: Diagnosis not present

## 2018-11-26 DIAGNOSIS — Z7409 Other reduced mobility: Secondary | ICD-10-CM | POA: Diagnosis not present

## 2018-11-26 DIAGNOSIS — M87052 Idiopathic aseptic necrosis of left femur: Secondary | ICD-10-CM | POA: Diagnosis not present

## 2018-11-30 DIAGNOSIS — M87052 Idiopathic aseptic necrosis of left femur: Secondary | ICD-10-CM | POA: Diagnosis not present

## 2018-11-30 DIAGNOSIS — Z7409 Other reduced mobility: Secondary | ICD-10-CM | POA: Diagnosis not present

## 2018-11-30 DIAGNOSIS — M25552 Pain in left hip: Secondary | ICD-10-CM | POA: Diagnosis not present

## 2018-12-01 DIAGNOSIS — T8189XA Other complications of procedures, not elsewhere classified, initial encounter: Secondary | ICD-10-CM | POA: Diagnosis not present

## 2018-12-04 ENCOUNTER — Encounter: Payer: Self-pay | Admitting: Family Medicine

## 2018-12-05 ENCOUNTER — Encounter: Payer: Self-pay | Admitting: Family Medicine

## 2018-12-07 MED ORDER — INSULIN GLARGINE 100 UNIT/ML SOLOSTAR PEN: 35.0000 [IU] | PEN_INJECTOR | Freq: Every day | SUBCUTANEOUS | 99 refills | Status: DC

## 2018-12-07 MED ORDER — TADALAFIL 20 MG PO TABS: 20.0000 mg | ORAL_TABLET | ORAL | 1 refills | Status: DC | PRN

## 2018-12-07 MED ORDER — "INSULIN SYRINGE-NEEDLE U-100 31G X 5/16"" 0.3 ML MISC": 3 refills | Status: DC

## 2018-12-08 DIAGNOSIS — Z7409 Other reduced mobility: Secondary | ICD-10-CM | POA: Diagnosis not present

## 2018-12-08 DIAGNOSIS — M87052 Idiopathic aseptic necrosis of left femur: Secondary | ICD-10-CM | POA: Diagnosis not present

## 2018-12-08 DIAGNOSIS — M25552 Pain in left hip: Secondary | ICD-10-CM | POA: Diagnosis not present

## 2018-12-15 DIAGNOSIS — L905 Scar conditions and fibrosis of skin: Secondary | ICD-10-CM | POA: Diagnosis not present

## 2018-12-29 ENCOUNTER — Ambulatory Visit: Payer: BLUE CROSS/BLUE SHIELD | Admitting: Family Medicine

## 2019-01-04 ENCOUNTER — Telehealth: Payer: Self-pay | Admitting: Family Medicine

## 2019-01-04 NOTE — Telephone Encounter (Signed)
Will you order lab work?

## 2019-01-04 NOTE — Telephone Encounter (Signed)
Pt is calling to see about his lab orders being sent to [facility] in [place] so that he can go there and get it drawn, pt cancelled his 6 month recheck here. He said that dr stacks to order what he normally orders and if he wants to add Cholesterol he can, please call pt once it has been ordered

## 2019-01-04 NOTE — Telephone Encounter (Signed)
Please contact the patient . I can not order blood work if he isn't going to have office or phone visit. WS

## 2019-01-05 ENCOUNTER — Ambulatory Visit: Payer: BLUE CROSS/BLUE SHIELD | Admitting: Family Medicine

## 2019-01-05 NOTE — Telephone Encounter (Signed)
Left message to please call our office to schedule an appointment with his pcp. Provider will not order blood work unless patient has an appointment.

## 2019-01-12 ENCOUNTER — Other Ambulatory Visit: Payer: Self-pay | Admitting: *Deleted

## 2019-01-12 DIAGNOSIS — E785 Hyperlipidemia, unspecified: Principal | ICD-10-CM

## 2019-01-12 DIAGNOSIS — E1169 Type 2 diabetes mellitus with other specified complication: Secondary | ICD-10-CM

## 2019-01-12 DIAGNOSIS — I2510 Atherosclerotic heart disease of native coronary artery without angina pectoris: Secondary | ICD-10-CM

## 2019-01-12 NOTE — Progress Notes (Signed)
PATIENT IS SCHEDULE  FOR  APPT5/15/20 WITH DR HARDING--  LIPID , CMP ORDER

## 2019-01-18 ENCOUNTER — Encounter: Payer: Self-pay | Admitting: Family Medicine

## 2019-01-18 NOTE — Telephone Encounter (Signed)
Sent this note to -- Alexander Pennington , please have your primary office -to  place an order for [facility]  . you can then do both orders at the sametime , you would have to let the  Lab tech.know that  You have to sets of labs to draw.

## 2019-01-23 ENCOUNTER — Other Ambulatory Visit: Payer: Self-pay | Admitting: *Deleted

## 2019-01-23 DIAGNOSIS — E1142 Type 2 diabetes mellitus with diabetic polyneuropathy: Secondary | ICD-10-CM

## 2019-01-26 ENCOUNTER — Telehealth: Payer: Self-pay | Admitting: *Deleted

## 2019-01-26 NOTE — Telephone Encounter (Signed)
   TELEPHONE CALL NOTE  This patient has been deemed a candidate for follow-up tele-health visit to limit community exposure during the Covid-19 pandemic. I spoke with the patient via phone to discuss instructions.. The patient will receive a phone call 2-3 days prior to their E-Visit at which time consent will be verbally confirmed.   A Virtual Office Visit appointment type has been scheduled for 5/18 with [facility], with "VIDEO"/TEXT    I have  confirmed the patient is active in MyChart  Tobin Chad, [place] 01/26/2019 5:54 PM

## 2019-01-27 DIAGNOSIS — E1169 Type 2 diabetes mellitus with other specified complication: Secondary | ICD-10-CM | POA: Diagnosis not present

## 2019-01-27 DIAGNOSIS — E785 Hyperlipidemia, unspecified: Secondary | ICD-10-CM | POA: Diagnosis not present

## 2019-01-27 DIAGNOSIS — I2510 Atherosclerotic heart disease of native coronary artery without angina pectoris: Secondary | ICD-10-CM | POA: Diagnosis not present

## 2019-01-28 ENCOUNTER — Telehealth: Payer: Self-pay | Admitting: Cardiology

## 2019-01-28 LAB — COMPREHENSIVE METABOLIC PANEL
ALT: 17 IU/L (ref 0–44)
AST: 14 IU/L (ref 0–40)
Albumin/Globulin Ratio: 2 (ref 1.2–2.2)
Albumin: 4.5 g/dL (ref 3.8–4.9)
Alkaline Phosphatase: 79 IU/L (ref 39–117)
BUN/Creatinine Ratio: 29 — ABNORMAL HIGH (ref 9–20)
BUN: 18 mg/dL (ref 6–24)
Bilirubin Total: 0.4 mg/dL (ref 0.0–1.2)
CO2: 25 mmol/L (ref 20–29)
Calcium: 9.4 mg/dL (ref 8.7–10.2)
Chloride: 100 mmol/L (ref 96–106)
Creatinine, Ser: 0.62 mg/dL — ABNORMAL LOW (ref 0.76–1.27)
GFR calc Af Amer: 126 mL/min/{1.73_m2} (ref >59)
GFR calc non Af Amer: 109 mL/min/{1.73_m2} (ref >59)
Globulin, Total: 2.3 g/dL (ref 1.5–4.5)
Glucose: 145 mg/dL — ABNORMAL HIGH (ref 65–99)
Potassium: 4.7 mmol/L (ref 3.5–5.2)
Sodium: 139 mmol/L (ref 134–144)
Total Protein: 6.8 g/dL (ref 6.0–8.5)

## 2019-01-28 LAB — LIPID PANEL
Chol/HDL Ratio: 4.7 ratio (ref 0.0–5.0)
Cholesterol, Total: 199 mg/dL (ref 100–199)
HDL: 42 mg/dL (ref >39)
LDL Calculated: 128 mg/dL — ABNORMAL HIGH (ref 0–99)
Triglycerides: 144 mg/dL (ref 0–149)
VLDL Cholesterol Cal: 29 mg/dL (ref 5–40)

## 2019-01-28 NOTE — Telephone Encounter (Signed)
Smartphone/ consent/ my chart active/ pre reg completed °

## 2019-01-29 ENCOUNTER — Telehealth (INDEPENDENT_AMBULATORY_CARE_PROVIDER_SITE_OTHER): Payer: BLUE CROSS/BLUE SHIELD | Admitting: Cardiology

## 2019-01-29 ENCOUNTER — Telehealth: Payer: Self-pay | Admitting: *Deleted

## 2019-01-29 ENCOUNTER — Encounter: Payer: Self-pay | Admitting: Cardiology

## 2019-01-29 VITALS — BP 150/70 | HR 73 | Ht 68.0 in | Wt 265.0 lb

## 2019-01-29 DIAGNOSIS — I10 Essential (primary) hypertension: Secondary | ICD-10-CM

## 2019-01-29 DIAGNOSIS — E1169 Type 2 diabetes mellitus with other specified complication: Secondary | ICD-10-CM | POA: Diagnosis not present

## 2019-01-29 DIAGNOSIS — E785 Hyperlipidemia, unspecified: Secondary | ICD-10-CM | POA: Diagnosis not present

## 2019-01-29 DIAGNOSIS — I2510 Atherosclerotic heart disease of native coronary artery without angina pectoris: Secondary | ICD-10-CM | POA: Diagnosis not present

## 2019-01-29 DIAGNOSIS — E118 Type 2 diabetes mellitus with unspecified complications: Secondary | ICD-10-CM

## 2019-01-29 DIAGNOSIS — Z794 Long term (current) use of insulin: Secondary | ICD-10-CM

## 2019-01-29 MED ORDER — VALSARTAN 160 MG PO TABS: 160.0000 mg | ORAL_TABLET | Freq: Every day | ORAL | 11 refills | Status: DC

## 2019-01-29 NOTE — Patient Instructions (Addendum)
Medication Instructions:    Restart Valsartan 160 mg PO daily -- Anticipate starting Repatha after [facility] discussion.  If you need a refill on your cardiac medications before your next appointment, please call your pharmacy.   Lab work: RECHECK IN 3 MONTHS AFTER STARTING REPATHA - [facility] WILL COORDINATE SENDING YOU THE  LABSLIP  If you have labs (blood work) drawn today and your tests are completely normal, you will receive your results only by: [name] MyChart Message (if you have MyChart) OR . A paper copy in the mail If you have any lab test that is abnormal or we need to change your treatment, we will call you to review the results.  Testing/Procedures:   NOT NEEDED Follow-Up: At [facility], you and your health needs are our priority.  As part of our continuing mission to provide you with exceptional heart care, we have created designated Provider Care Teams.  These Care Teams include your primary Cardiologist (physician) and Advanced Practice Providers (APPs -  Physician Assistants and Nurse Practitioners) who all work together to provide you with the care you need, when you need it. . You will need a follow up appointment in 8 months JAN 2021.  Please call our office 2 months in advance to schedule this appointment.  You may see Bryan Lemma, MD or one of the following Advanced Practice Providers on your designated Care Team:   . Theodore Demark, PA-C . Joni Reining, DNP, ANP Your physician recommends that you schedule a follow-up appointment [facility] - TO DISCUSS STARTING REPATHA- OFFICE WILL CONTACT YOU ABOUT APPOINTMENT.   Any Other Special Instructions Will Be Listed Below (If Applicable).

## 2019-01-29 NOTE — Telephone Encounter (Signed)
Spoke to patient - instruction given from tele-visit 5/15 ,  Patient request [facility] [address].  avs summary will be sent  Via mychart.  [facility] WILL CONTACT PATIENT TO F/U APPT @ REPATHA - PATIENT STATES HE IS OKAY WITH VIRTUAL VISIT -  VERBALIZED UNDERSTANDING.

## 2019-01-29 NOTE — Progress Notes (Signed)
Virtual Visit via Video Note   This visit type was conducted due to national recommendations for restrictions regarding the COVID-19 Pandemic (e.g. social distancing) in an effort to limit this patient's exposure and mitigate transmission in our community.  Due to his co-morbid illnesses, this patient is at least at moderate risk for complications without adequate follow up.  This format is felt to be most appropriate for this patient at this time.  All issues noted in this document were discussed and addressed.  A limited physical exam was performed with this format.  Please refer to the patient's chart for his consent to telehealth for [facility].   Patient has given verbal permission to conduct this visit via virtual appointment and to bill insurance 01/29/2019 12:09 PM     Evaluation Performed:  Follow-up visit  Date:  01/29/2019   ID:  [name], DOB [birth year], MRN [id number]  Patient Location: Home Provider Location: Home  PCP:  Mechele Claude, MD -- searching for new GP  Cardiologist:  Bryan Lemma, MD  Electrophysiologist:  None   Chief Complaint: 45-month follow-up  History of Present Illness:    Alexander Pennington is a 58 y.o. 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  Alexander Pennington was initially seen by cardiology (Dr. Mayford Knife) in consultation (Sept 2015) -this was in the setting of diabetic foot ulcer in the left foot leading to osteomyelitis.  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 --> as part of pre-pop evaluation, he had an abnormal Myoview & Cath 06/2014:  Myoview: Intermediate risk.  EF 54%.   Medium sized, mild density reversible defect or wall.  Cannot exclude infarct versus diaphragmatic attenuation.   Cath revealed moderate-severe OM1/ AVG Cx & dRCA moderate disease -Med Rx recommended , especially in order not to delay his amputation surgery.      RAFE GOVONI was last seen on July 23, 2018 doing quite well from a cardiac standpoint.  Limited by left hip pain (barely able to walk) Noted mild exertional dyspnea essentially chronic.  Compliant with CPAP (nasal pillows) and nighttime oxygen. In addition hip pain, limited by Charcot foot. --Had not yet started PCSK9 inhibitor for fear of hypersensitivity.  Was unable to tolerate Livalo (noted tearing a muscle in his left shoulder as a reason for stopping) -- had been followed by our [facility] ([facility]) Lipid Clinic -- Was pending blepharoplasty surgery and was hoping to get an A1c below 7 diabetes hip surgery done. --> Carotid bruit versus radiated murmur.  Carotid Dopplers were.  Does have a grade 3 out of 6 C-D midsystolic SEM --> We increased Valsartan to 160 mg twice daily -- but he self stopped after Hip Sgx in January. Now not on ARB or any lipid Rx.   Interval History:  I am seeing Alexander Pennington today via telehealth visit.  He is recovering now from his hip surgery that he had done in January at [facility].  Apparently his blood pressure was low postop, so he stopped taking his valsartan.  Has not restarted.  He indicated that his erectile dysfunction is much better since being off of the medicine.  He  values erectile function over blood pressure control.  He never did get started on PCSK9 inhibitor, but is interested in starting now that he is recovered from his surgery.  He just wants to get the surgery done before he tried anything new.  Despite having PAD and CAD as well as peripheral neuropathy, he seems to be doing relatively well.  He is in somewhat denial about the extent of his comorbidities, but  despite this is trying to stay active.  He did well with rehab after his hip surgery.  He says his energy level is notably improved along with his "breath "now that he is able to actually be physically active with no longer having hip pain.  He denies any anginal symptoms with rest or exertion.  No exertional dyspnea.  No PND, orthopnea with only minimal edema. He is able to walk up a couple flights of steps without really getting dyspneic.  He does have some mild issues with balance because of his no amputations.  No real dizziness except for occasionally if he stands up quickly or wakes up quickly.  Cardiovascular ROS: no chest pain or dyspnea on exertion negative for - edema, irregular heartbeat, loss of consciousness, orthopnea, palpitations, paroxysmal nocturnal dyspnea, rapid heart rate, shortness of breath or Near syncope, TIA system versus fugax, claudication.  Melena, hematochezia, hematuria.  Epistaxis.  The patient does not have symptoms concerning for COVID-19 infection (fever, chills, cough, or new shortness of breath).  The patient is practicing social distancing.  ROS:  Please see the history of present illness.    Review of Systems  Constitutional: Negative for malaise/fatigue (E getting better post-op).  HENT: Negative for congestion and nosebleeds.   Respiratory: Negative for cough and shortness of breath (breathing better now that post-op Hip).   Cardiovascular: Negative for claudication.  Gastrointestinal: Negative for blood in stool and melena.  Genitourinary: Negative for hematuria.  Musculoskeletal: Positive for back pain, joint pain (left arm and shoulder, foot) and myalgias (mild).        getting over L Hip surgery - did well with Rehab -- now notes energy getting better.  Neurological: Positive for dizziness (off & on).  Psychiatric/Behavioral: Negative.  The patient is not nervous/anxious.   All other systems reviewed and are negative.    Past Medical History:   Diagnosis Date  . CAD (coronary artery disease) 06/2014   Cath: Heavily calcified LAD with mild  diffuse disease, Large d1 ~30&40%. LCx ok up to Large OM1 where there is ~70% focal disease in the AVG Cx, proxOM1 75%. Dominant RCA - mid 40% & 50% with moderate diffuse calcification, prior to PDA ~60%. EF 65-70% --> Recommended Med Cx (PCI if Sx warrant)  . Charcot foot due to diabetes mellitus (HCC)   . Complication of anesthesia    Pt. reports that he has done well the last several surgeries. hx deviated septum, sleep apnea- has had problems. Pt. reports that the tube was pulled out too soon, due to sleep apnea , decreasing sats, & when they tried to put it back it went into his sinus, this event occured with Lap Band surgery when he had a lot more weight on him   . Diabetes mellitus (HCC)    type 2  . Diabetic Charcot foot (HCC)   . Family history of anesthesia complication    mom with n/v  . Hemorrhagic stroke (HCC)    a. Dx 2010. 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 Unclear etiology. right eye  damaged - has no central vision, has a little problem with balance  . Hyperlipidemia   . Hypertension   . Kidney stones   . Murmur, heart Fall 2014 - diagnosed  . Neuropathy   . Obesity    a. hx of lap band surgery.  . Sleep apnea    uses c-pap   Past Surgical History:  Procedure Laterality Date  . AMPUTATION Left 07/06/2014   Procedure: Left 2nd and Possible 3rd Ray Amputation;  Surgeon: Nadara MustardMarcus Duda V, MD;  [facility];  Service: Orthopedics;  Laterality: Left;  . AMPUTATION Left 09/23/2014   Procedure: Foot 5th Ray Amputation;  Surgeon: Nadara MustardMarcus Duda V, MD;  [facility];  Service: Orthopedics;  Laterality: Left;  . CARPAL TUNNEL RELEASE Left    bone removed also, nerve also cut  . COLONOSCOPY    . FOOT SURGERY     foot surgery x 2  . HARDWARE REMOVAL Left 07/06/2014   Procedure: Removal Deep Hardware Left Foot;  Surgeon: Nadara MustardMarcus Duda V, MD;  [facility];  Service: Orthopedics;   Laterality: Left;  . I&D EXTREMITY Bilateral 06/17/2014   Procedure: IRRIGATION AND DEBRIDEMENT BILATERAL FOOT WOUNDS;  Surgeon: Kathryne Hitchhristopher Y Blackman, MD;  Location: [facility] ORS;  Service: Orthopedics;  Laterality: Bilateral;  . I&D EXTREMITY Right 11/04/2014   Procedure: Excision Charcot Collapse Right Foot;  Surgeon: Nadara MustardMarcus Duda V, MD;  Location: [facility] OR;  Service: Orthopedics;  Laterality: Right;  . lap band surgery     2009  . LEFT HEART CATHETERIZATION WITH CORONARY ANGIOGRAM N/A 06/16/2014   Procedure: LEFT HEART CATHETERIZATION WITH CORONARY ANGIOGRAM;  Surgeon: Micheline ChapmanMichael D Cooper, MD;  [facility];;* Heavily calcified LAD w/ mild-mod diffuse disease, Large D1 ~30&40%. LCx ok to Large OM1 w/ ~70% focal disease in the AVG Cx, pOM1 75%. Dominant RCA - mid 40% & 50% with mod diffuse calcification, prior to PDA ~60%. EF 65-70% - Plan Med Rx unless Sx warrant PCI  . NM MYOVIEW LTD  05/2014   Intermediate risk.  EF 54%.  Medium sized, mild density reversible defect or wall.  Cannot exclude infarct versus diaphragmatic attenuation. --> Cath revealed OM1/ AVG Cx & dRCA moderate disease -Med Rx recommended  . TRANSTHORACIC ECHOCARDIOGRAM  05/2014    Mild LVH.  EF 45 to 50%.  Cannot exclude regional wall motion abnormality.  GR 1 DD.  Aortic sclerosis but no stenosis  . TRANSTHORACIC ECHOCARDIOGRAM  02/05/2018   Mild LVH.  EF 65-70%.  Hyperdynamic/vigorous.  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  No valvular lesion.  Mild pulmonary hypertension  . VASECTOMY       Current Meds  Medication Sig  . aspirin EC 81 MG tablet Take 1 tablet (81 mg total) by mouth daily.  . canagliflozin (INVOKANA) 300 MG TABS tablet Take 1 tablet (300 mg total) by mouth daily.  . celecoxib (CELEBREX) 200 MG capsule Take 1 capsule (200 mg total) by mouth 2 (two) times daily.  . Coenzyme Q10 200 MG capsule Take 1 capsule (200 mg total) by mouth daily.  [name]. gabapentin (NEURONTIN) 600 MG tablet TAKE 2 TABLETS THREE TIMES  A DAY  . HYDROcodone-acetaminophen (NORCO) 5-325 MG tablet Take 1 tablet by mouth 2 (two) times daily after a meal for 30 days. Take for moderate to severe pain  . insulin aspart (NOVOLOG) 100 UNIT/ML injection Inject 3 units before lunch and supper daily  . Insulin Glargine (LANTUS SOLOSTAR) 100 UNIT/ML Solostar Pen Inject 35 Units into  the skin daily. (Patient taking differently: Inject 36 Units into the skin daily. )  . insulin lispro (HUMALOG) 100 UNIT/ML injection INJECT 5 UNITS UNDER THE SKIN THREE TIMES A DAY BEFORE MEALS  . Insulin Syringe-Needle U-100 (B-D INSULIN SYRINGE) 31G X 5/16" 0.3 ML MISC USE TO INJECT insulin UNDER THE SKIN  AS DIRECTED BY YOUR DOCTOR  . LORazepam (ATIVAN) 1 MG tablet Take 1 tablet (1 mg total) by mouth at bedtime as needed.  . metFORMIN (GLUMETZA) 1000 MG (MOD) 24 hr tablet Take 1 tablet (1,000 mg total) by mouth 2 (two) times daily with a meal.  . Multiple Vitamins tablet Take 1 tablet by mouth daily.  . Testosterone (ANDROGEL PUMP) 20.25 MG/ACT (1.62%) GEL Place 4 Applicatorfuls onto the skin daily.  . traMADol (ULTRAM) 50 MG tablet Take 2 tablets (100 mg total) by mouth 3 (three) times daily.  . [DISCONTINUED] tadalafil (ADCIRCA/CIALIS) 20 MG tablet Take 1 tablet (20 mg total) by mouth every other day as needed for erectile dysfunction.     Allergies:   Beta adrenergic blockers; Tetanus toxoids; Ciprofloxacin; Eggs or egg-derived products; Labetalol hcl; Lansoprazole; Nsaids; Other; and Statins  "complains of heightened sensitivities to many medications. He had done well with Diovan, but because of the recall wasswitched to Benicar. He reports that this "floored" him and has caused severe constipation. His diabetes is poorly controlled, and at his last visit thought he was having a hypoglycemic spell. I checked his sugar and it was 294. I would suspect that he begins to feel hypoglycemic at a fairly high level. He admits that he never checks his sugar when  he feels it "crash", or even after having something to eat.   Social History   Tobacco Use  . Smoking status: Former Smoker    Types: Cigarettes    Last attempt to quit: 06/13/1988    Years since quitting: 30.6  . Smokeless tobacco: Never Used  . Tobacco comment: Smoked for ~8 yrs.  Substance Use Topics  . Alcohol use: Yes    Comment: three times a week, one 12-oz cider  . Drug use: No     Family Hx: The patient's family history includes CAD (age of onset: 54) in his father; Cancer - Colon in his brother; Healthy in his mother; Heart attack (age of onset: 48) in his father; Valvular heart disease (age of onset: 68) in his brother.   Prior CV studies:   The following studies were reviewed today: . Carotid Dopplers July 30, 2018:: Bilateral ICA 1-39%.  Right CCA<50%.  Bilateral vertebral arteries normal antegrade flow.  Normal bilateral subclavian artery hemodynamics.  Labs/Other Tests and Data Reviewed:    EKG:  No ECG reviewed.  Recent Labs: 10/01/2018: Hemoglobin 15.3; Platelets 203 01/27/2019: ALT 17; BUN 18; Creatinine, Ser 0.62; Potassium 4.7; Sodium 139 ;  Lab Results  Component Value Date   HGBA1C 8.1 (H) 06/29/2018    Recent Lipid Panel Lab Results  Component Value Date   CHOL 199 01/27/2019   HDL 42 01/27/2019   LDLCALC 128 (H) 01/27/2019   TRIG 144 01/27/2019   CHOLHDL 4.7 01/27/2019    Wt Readings from Last 3 Encounters:  01/29/19 265 lb (120.2 kg)  09/30/18 270 lb (122.5 kg)  07/23/18 265 lb (120.2 kg)     Objective:    Vital Signs:  BP (!) 150/70   Pulse 73   Ht  (1.727 m)   Wt 265 lb (120.2 kg)   BMI 40.29  kg/m  (150/72 mmHg, 70 bpm) VITAL SIGNS:  reviewed GEN:  Well nourished, well developed male in no acute distress.  Mildly disheveled; Very garulous RESPIRATORY:  normal respiratory effort, symmetric expansion CARDIOVASCULAR:  no peripheral edema MUSCULOSKELETAL:  no obvious deformities. NEURO:  alert and oriented x 3, no obvious  focal deficit PSYCH:  normal affect   ASSESSMENT & PLAN:    Problem List Items Addressed This Visit    Coronary artery disease involving native coronary artery without angina pectoris - Primary (Chronic)   Relevant Medications   valsartan (DIOVAN) 160 MG tablet   Other Relevant Orders   Lipid panel   Comprehensive metabolic panel   Essential hypertension (Chronic)   Relevant Medications   valsartan (DIOVAN) 160 MG tablet   Hyperlipidemia associated with type 2 diabetes mellitus (HCC) (Chronic)   Relevant Medications   valsartan (DIOVAN) 160 MG tablet   Other Relevant Orders   Lipid panel   Comprehensive metabolic panel   Morbid obesity (HCC) (Chronic)   Relevant Orders   Lipid panel   Comprehensive metabolic panel   Type 2 diabetes mellitus with complication, with long-term current use of insulin (HCC) (Chronic)   Relevant Medications   valsartan (DIOVAN) 160 MG tablet   Other Relevant Orders   Lipid panel   Comprehensive metabolic panel      Interestingly, despite having significant CAD (albeit nonobstructive) and PAD with peripheral neuropathy, diabetes etc., Hawken is doing relatively well and is relatively asymptomatic.  I am not sure how much of this is just simply denial, but I am reluctant to be overly aggressive with treatment since he is hypersensitive to most treatments.  In the absence of symptoms, also would not recommend any further ischemic evaluation.  He tolerated his surgery well with no complications.  Blood pressure is quite high, and I think is not unreasonable to restart at least 160 mg valsartan.  He agrees.  His diabetic control has been borderline, but he said his A1c was down to 7.1 prior to his hip surgery although the last recorded number I have is 8.1.  Needs to have follow-up with his PCP, but he is on Invokana (which does have cardiac benefit) along with Lantus and sliding scale insulin plus metformin  Lipids are still not adequately controlled  given the extent of CAD and diabetes.  He seems to be agreeable to starting Repatha.  So I will have him touch base back with our  [facility] clinical pharmacists team that run our lipid clinic.  Hopefully we can get him in to discuss appropriate use of Repatha and then follow-up labs after.  Now that his hip is repaired, he is able to be more active.  I talk about the importance of staying active with try to get exercise and working on losing weight.  Plan: Restart valsartan 100 mg daily and refer back to [facility] Lipid Clinic with hopes to potentially start Repatha  COVID-19 Education: The signs and symptoms of COVID-19 were discussed with the patient and how to seek care for testing (follow up with PCP or arrange E-visit).   The importance of social distancing was discussed today.  Time:   Today, I have spent 24 minutes with the patient with telehealth technology discussing the above problems.     Medication Adjustments/Labs and Tests Ordered: Current medicines are reviewed at length with the patient today.  Concerns regarding medicines are outlined above.  Medication Instructions:   Restart Valsartan 160 mg PO daily  Will contact  our Pharmacist Team about starting Repatha   Tests Ordered: Orders Placed This Encounter  Procedures  . Lipid panel  . Comprehensive metabolic panel  - Recheck Lipid Panel in ~ 3 months (after starting Repatha)  Medication Changes: Meds ordered this encounter  Medications  . valsartan (DIOVAN) 160 MG tablet    Sig: Take 1 tablet (160 mg total) by mouth daily.    Dispense:  30 tablet    Refill:  11   Restart Valsartan 160 mg PO daily -- Anticipate starting Repatha after [facility] discussion.   Disposition:  Follow up - with [facility] to discuss Repatha.  8 months with MD)    Signed, Bryan Lemma, MD  01/29/2019 12:09 PM    [facility]

## 2019-02-03 ENCOUNTER — Encounter: Payer: Self-pay | Admitting: Family Medicine

## 2019-02-03 ENCOUNTER — Other Ambulatory Visit: Payer: Self-pay

## 2019-02-03 ENCOUNTER — Ambulatory Visit (INDEPENDENT_AMBULATORY_CARE_PROVIDER_SITE_OTHER): Payer: BLUE CROSS/BLUE SHIELD | Admitting: Family Medicine

## 2019-02-03 DIAGNOSIS — F1120 Opioid dependence, uncomplicated: Secondary | ICD-10-CM

## 2019-02-03 DIAGNOSIS — E349 Endocrine disorder, unspecified: Secondary | ICD-10-CM | POA: Diagnosis not present

## 2019-02-03 DIAGNOSIS — E1142 Type 2 diabetes mellitus with diabetic polyneuropathy: Secondary | ICD-10-CM | POA: Diagnosis not present

## 2019-02-03 DIAGNOSIS — I10 Essential (primary) hypertension: Secondary | ICD-10-CM

## 2019-02-03 MED ORDER — HYDROCODONE-ACETAMINOPHEN 5-325 MG PO TABS: 1.0000 | ORAL_TABLET | Freq: Two times a day (BID) | ORAL | 0 refills | Status: DC

## 2019-02-03 MED ORDER — INSULIN GLARGINE 100 UNIT/ML SOLOSTAR PEN: 36.0000 [IU] | PEN_INJECTOR | Freq: Every day | SUBCUTANEOUS | 3 refills | Status: DC

## 2019-02-03 MED ORDER — "INSULIN SYRINGE-NEEDLE U-100 31G X 5/16"" 0.5 ML MISC": 5 refills | Status: DC

## 2019-02-03 MED ORDER — TRAMADOL HCL 50 MG PO TABS: 100.0000 mg | ORAL_TABLET | Freq: Three times a day (TID) | ORAL | 0 refills | Status: DC

## 2019-02-03 MED ORDER — GABAPENTIN 600 MG PO TABS: ORAL_TABLET | ORAL | 3 refills | Status: DC

## 2019-02-03 NOTE — Progress Notes (Signed)
Subjective:    Patient ID: Alexander Pennington, male    DOB: 10/01/1960, 58 y.o.   MRN: [id number]   HPI: Alexander Pennington is a 58 y.o. male presenting for   Hip surgery went well. Did have a small area that didn't close at the incision. Now healed, but took three months.  Taking Lantus 36 units every evening.  Fasting 120- 136 glucose. Had A1c of 7.2 in January. Denies hypoglycemic reactions. Post prandial 130.  Taking metformin and invokana as ordered.  Pain level stable at using celebrex, gabapentin & tramadol TID. Caused by neuropathy. Well controlled unless he is late taking medication. 7-8/10 if he is 3+ hours late taking tramadol. Using hydrocodone rarely when he tries to due a strenuous project.  PDMP aware was reviewed.  It shows that he is filling only medicines given by me with the exception of postoperative oxycodone at the end of January.  He had a hip replacement surgery at that time.  It does show that he is filling his Lorazepam less frequently than once a month.  This is consistent with the history he gives below.  His last fill of hydrocodone for 60 pills was 4 months ago. Pt using lorazepam only occasionally. Takes two however at times. Has plenty left currently declines refill.      Follow up for testosterone deficiency: Pt. Using medication as directed. Denies any sx referrable to DVT such as edema or erythema of legs. No dyspnea or chest pain. Energy level reported as being good. Libido is normal and denies E.D. Feels strength is adequate and improved from baseline. Using 4 pumps daily.   [facility] 2/9 09/30/2018 03/17/2018 12/16/2017 09/17/2017 05/20/2017  Decreased Interest 0 0 0 0 0  Down, Depressed, Hopeless 0 0 0 0 0  PHQ - 2 Score 0 0 0 0 0     Relevant past medical, surgical, family and social history reviewed and updated as indicated.  Interim medical history since our last visit reviewed. Allergies and medications reviewed and updated.  ROS:  Review of  Systems  Constitutional: Negative.   HENT: Negative.   Eyes: Negative for visual disturbance.  Respiratory: Negative for cough and shortness of breath.   Cardiovascular: Negative for chest pain and leg swelling.  Gastrointestinal: Negative for abdominal pain, diarrhea, nausea and vomiting.  Genitourinary: Negative for difficulty urinating.  Musculoskeletal: Negative for arthralgias and myalgias.  Skin: Negative for rash.  Neurological: Negative for headaches.       Neuropathy  Psychiatric/Behavioral: Negative for sleep disturbance.     Social History   Tobacco Use  Smoking Status Former Smoker  . Types: Cigarettes  . Last attempt to quit: 06/13/1988  . Years since quitting: 30.6  Smokeless Tobacco Never Used  Tobacco Comment   Smoked for ~8 yrs.       Objective:     Wt Readings from Last 3 Encounters:  01/29/19 265 lb (120.2 kg)  09/30/18 270 lb (122.5 kg)  07/23/18 265 lb (120.2 kg)     Exam deferred. Pt. Harboring due to COVID 19. Phone visit performed.   Assessment & Plan:   1. Testosterone deficiency   2. Diabetic peripheral neuropathy associated with type 2 diabetes mellitus (HCC)   3. Essential hypertension   4. Uncomplicated opioid dependence (HCC)     Meds ordered this encounter  Medications  . Insulin Syringe-Needle U-100 31G X 5/16" 0.5 ML MISC    Sig: Use for daily insulin  Dispense:  100 each    Refill:  5  . traMADol (ULTRAM) 50 MG tablet    Sig: Take 2 tablets (100 mg total) by mouth 3 (three) times daily.    Dispense:  540 tablet    Refill:  0  . HYDROcodone-acetaminophen (NORCO) 5-325 MG tablet    Sig: Take 1 tablet by mouth 2 (two) times daily after a meal for 30 days. Take for moderate to severe pain    Dispense:  60 tablet    Refill:  0  . gabapentin (NEURONTIN) 600 MG tablet    Sig: TAKE 2 TABLETS THREE TIMES A DAY    Dispense:  540 tablet    Refill:  3  . Insulin Glargine (LANTUS SOLOSTAR) 100 UNIT/ML Solostar Pen    Sig: Inject  36 Units into the skin daily.    Dispense:  11 pen    Refill:  3    No orders of the defined types were placed in this encounter.     Diagnoses and all orders for this visit:  Testosterone deficiency  Diabetic peripheral neuropathy associated with type 2 diabetes mellitus (HCC) -     traMADol (ULTRAM) 50 MG tablet; Take 2 tablets (100 mg total) by mouth 3 (three) times daily. -     gabapentin (NEURONTIN) 600 MG tablet; TAKE 2 TABLETS THREE TIMES A DAY  Essential hypertension  Uncomplicated opioid dependence (HCC)  Other orders -     Insulin Syringe-Needle U-100 31G X 5/16" 0.5 ML MISC; Use for daily insulin -     HYDROcodone-acetaminophen (NORCO) 5-325 MG tablet; Take 1 tablet by mouth 2 (two) times daily after a meal for 30 days. Take for moderate to severe pain -     Insulin Glargine (LANTUS SOLOSTAR) 100 UNIT/ML Solostar Pen; Inject 36 Units into the skin daily.    Virtual Visit via telephone Note  I discussed the limitations, risks, security and privacy concerns of performing an evaluation and management service by telephone and the availability of in person appointments. The patient was identified with two identifiers. Pt.expressed understanding and agreed to proceed. Pt. Is at home. Dr. Darlyn Read is in his office.  Follow Up Instructions:   I discussed the assessment and treatment plan with the patient. The patient was provided an opportunity to ask questions and all were answered. The patient agreed with the plan and demonstrated an understanding of the instructions.   The patient was advised to call back or seek an in-person evaluation if the symptoms worsen or if the condition fails to improve as anticipated.   Total minutes including chart review and phone contact time: 45   Follow up plan: Return in about 3 months (around 05/06/2019).  Mechele Claude, MD [facility]

## 2019-02-04 ENCOUNTER — Telehealth: Payer: Self-pay | Admitting: Pharmacist

## 2019-02-04 NOTE — Telephone Encounter (Signed)
*  Telephone follow up - Lipid*  Alexander Pennington is a 58 y.o. male patient with PMH relevant to  hyperlipidemia,  CAD (heavily calcified LAD, > 50% calcification in OM1, AV groove and PDA), hemorrhagic stroke, hypertension, testosterone deficiency, DM (with nephropathy and neuropathy), obesity (lap band procedure in 2009) and sleep apnea.    Current Medications: none  Intolerances:  Pravastatin 20mg  Pituvastatin 2mg   Ezetimibe 10mg   LDL goal: <70 mg/dL  Labs: 0/06/9322: CHO 557; TG 144; HDL 42; LDL-c 128  Assessment and Plan:   Repatha 140mg  indication, administration, common side effects, and PA needed for medication payment. Patient agrees with Repatha 140mg , plan to try for 30 days prior to getting 90 day supply.    Alexander Pennington PharmD, BCPS, CPP [facility] [address] [place] [address] 02/04/2019 3:28 PM

## 2019-02-05 ENCOUNTER — Other Ambulatory Visit: Payer: Self-pay | Admitting: *Deleted

## 2019-02-05 ENCOUNTER — Encounter: Payer: Self-pay | Admitting: Family Medicine

## 2019-02-05 DIAGNOSIS — E118 Type 2 diabetes mellitus with unspecified complications: Secondary | ICD-10-CM

## 2019-02-11 ENCOUNTER — Telehealth: Payer: Self-pay | Admitting: Family Medicine

## 2019-02-12 ENCOUNTER — Other Ambulatory Visit: Payer: Self-pay | Admitting: Family Medicine

## 2019-02-12 MED ORDER — HYDROCODONE-ACETAMINOPHEN 10-325 MG PO TABS: 1.0000 | ORAL_TABLET | Freq: Two times a day (BID) | ORAL | 0 refills | Status: DC

## 2019-02-12 MED ORDER — PEN NEEDLES 30G X 8 MM MISC: 1.0000 [IU] | Freq: Every day | 3 refills | Status: DC

## 2019-02-12 MED ORDER — HYDROCODONE-ACETAMINOPHEN 5-325 MG PO TABS: 1.0000 | ORAL_TABLET | Freq: Two times a day (BID) | ORAL | 0 refills | Status: DC

## 2019-02-12 NOTE — Telephone Encounter (Signed)
I sent in the requested prescription 

## 2019-02-12 NOTE — Addendum Note (Signed)
Addended by: Margorie John on: 02/12/2019 08:52 AM   Modules accepted: Orders

## 2019-02-12 NOTE — Telephone Encounter (Signed)
Pt aware by email - mychart

## 2019-02-18 MED ORDER — EVOLOCUMAB 140 MG/ML ~~LOC~~ SOAJ: 140.0000 mg | SUBCUTANEOUS | 11 refills | Status: DC

## 2019-02-18 NOTE — Addendum Note (Signed)
Addended by: Pearletha Furl on: 02/18/2019 10:14 AM   Modules accepted: Orders

## 2019-02-18 NOTE — Telephone Encounter (Signed)
Patient okay to send Rx to local [facility]. He will call back if unable to tolerate or unable to afford.

## 2019-03-10 ENCOUNTER — Other Ambulatory Visit: Payer: Self-pay | Admitting: Family Medicine

## 2019-03-10 ENCOUNTER — Telehealth: Payer: Self-pay | Admitting: Family Medicine

## 2019-03-10 DIAGNOSIS — E349 Endocrine disorder, unspecified: Secondary | ICD-10-CM

## 2019-03-10 MED ORDER — TESTOSTERONE 20.25 MG/ACT (1.62%) TD GEL: 4.0000 | Freq: Every day | TRANSDERMAL | 1 refills | Status: DC

## 2019-03-10 NOTE — Telephone Encounter (Signed)
Last office visit 02-03-19 with testosterone lab work on 10-01-18.   Please advise on refill.

## 2019-03-22 DIAGNOSIS — Z96642 Presence of left artificial hip joint: Secondary | ICD-10-CM | POA: Diagnosis not present

## 2019-03-22 DIAGNOSIS — Z87891 Personal history of nicotine dependence: Secondary | ICD-10-CM | POA: Diagnosis not present

## 2019-03-22 DIAGNOSIS — Z471 Aftercare following joint replacement surgery: Secondary | ICD-10-CM | POA: Diagnosis not present

## 2019-03-29 ENCOUNTER — Other Ambulatory Visit: Payer: Self-pay | Admitting: Family Medicine

## 2019-03-29 DIAGNOSIS — E1142 Type 2 diabetes mellitus with diabetic polyneuropathy: Secondary | ICD-10-CM

## 2019-04-27 ENCOUNTER — Other Ambulatory Visit: Payer: Self-pay | Admitting: *Deleted

## 2019-04-27 DIAGNOSIS — H02831 Dermatochalasis of right upper eyelid: Secondary | ICD-10-CM | POA: Diagnosis not present

## 2019-04-27 DIAGNOSIS — H02834 Dermatochalasis of left upper eyelid: Secondary | ICD-10-CM | POA: Diagnosis not present

## 2019-04-27 DIAGNOSIS — E1142 Type 2 diabetes mellitus with diabetic polyneuropathy: Secondary | ICD-10-CM

## 2019-04-28 DIAGNOSIS — Z471 Aftercare following joint replacement surgery: Secondary | ICD-10-CM | POA: Diagnosis not present

## 2019-04-28 DIAGNOSIS — Z96642 Presence of left artificial hip joint: Secondary | ICD-10-CM | POA: Diagnosis not present

## 2019-05-10 ENCOUNTER — Other Ambulatory Visit: Payer: Self-pay | Admitting: Family Medicine

## 2019-05-10 DIAGNOSIS — E118 Type 2 diabetes mellitus with unspecified complications: Secondary | ICD-10-CM

## 2019-05-10 DIAGNOSIS — E1142 Type 2 diabetes mellitus with diabetic polyneuropathy: Secondary | ICD-10-CM

## 2019-05-10 DIAGNOSIS — E1121 Type 2 diabetes mellitus with diabetic nephropathy: Secondary | ICD-10-CM

## 2019-05-11 DIAGNOSIS — M2142 Flat foot [pes planus] (acquired), left foot: Secondary | ICD-10-CM | POA: Diagnosis not present

## 2019-05-11 DIAGNOSIS — T85848A Pain due to other internal prosthetic devices, implants and grafts, initial encounter: Secondary | ICD-10-CM | POA: Diagnosis not present

## 2019-05-11 DIAGNOSIS — Z981 Arthrodesis status: Secondary | ICD-10-CM | POA: Diagnosis not present

## 2019-05-11 DIAGNOSIS — E1142 Type 2 diabetes mellitus with diabetic polyneuropathy: Secondary | ICD-10-CM | POA: Diagnosis not present

## 2019-05-11 DIAGNOSIS — E0842 Diabetes mellitus due to underlying condition with diabetic polyneuropathy: Secondary | ICD-10-CM | POA: Diagnosis not present

## 2019-05-11 DIAGNOSIS — M2012 Hallux valgus (acquired), left foot: Secondary | ICD-10-CM | POA: Diagnosis not present

## 2019-05-11 DIAGNOSIS — M19072 Primary osteoarthritis, left ankle and foot: Secondary | ICD-10-CM | POA: Diagnosis not present

## 2019-05-11 DIAGNOSIS — M778 Other enthesopathies, not elsewhere classified: Secondary | ICD-10-CM | POA: Diagnosis not present

## 2019-05-11 DIAGNOSIS — Z96698 Presence of other orthopedic joint implants: Secondary | ICD-10-CM | POA: Diagnosis not present

## 2019-05-11 DIAGNOSIS — T8484XD Pain due to internal orthopedic prosthetic devices, implants and grafts, subsequent encounter: Secondary | ICD-10-CM | POA: Diagnosis not present

## 2019-05-11 DIAGNOSIS — X58XXXD Exposure to other specified factors, subsequent encounter: Secondary | ICD-10-CM | POA: Diagnosis not present

## 2019-05-11 DIAGNOSIS — M7989 Other specified soft tissue disorders: Secondary | ICD-10-CM | POA: Diagnosis not present

## 2019-05-13 ENCOUNTER — Ambulatory Visit (INDEPENDENT_AMBULATORY_CARE_PROVIDER_SITE_OTHER): Payer: BC Managed Care – PPO | Admitting: Family Medicine

## 2019-05-13 ENCOUNTER — Encounter: Payer: Self-pay | Admitting: Family Medicine

## 2019-05-13 VITALS — BP 148/70 | Wt 272.0 lb

## 2019-05-13 DIAGNOSIS — I10 Essential (primary) hypertension: Secondary | ICD-10-CM

## 2019-05-13 DIAGNOSIS — F1120 Opioid dependence, uncomplicated: Secondary | ICD-10-CM

## 2019-05-13 DIAGNOSIS — E1142 Type 2 diabetes mellitus with diabetic polyneuropathy: Secondary | ICD-10-CM

## 2019-05-13 DIAGNOSIS — E1121 Type 2 diabetes mellitus with diabetic nephropathy: Secondary | ICD-10-CM

## 2019-05-13 MED ORDER — LANTUS SOLOSTAR 100 UNIT/ML ~~LOC~~ SOPN: 36.0000 [IU] | PEN_INJECTOR | Freq: Every day | SUBCUTANEOUS | 3 refills | Status: DC

## 2019-05-13 MED ORDER — LORAZEPAM 1 MG PO TABS: 1.0000 mg | ORAL_TABLET | Freq: Every evening | ORAL | 2 refills | Status: DC | PRN

## 2019-05-13 MED ORDER — HYDROCODONE-ACETAMINOPHEN 5-325 MG PO TABS: 1.0000 | ORAL_TABLET | Freq: Two times a day (BID) | ORAL | 0 refills | Status: DC

## 2019-05-13 MED ORDER — TRAMADOL HCL 50 MG PO TABS: 100.0000 mg | ORAL_TABLET | Freq: Three times a day (TID) | ORAL | 0 refills | Status: DC

## 2019-05-13 MED ORDER — CANAGLIFLOZIN 300 MG PO TABS: 300.0000 mg | ORAL_TABLET | Freq: Every day | ORAL | 3 refills | Status: DC

## 2019-05-13 MED ORDER — GABAPENTIN 600 MG PO TABS: ORAL_TABLET | ORAL | 3 refills | Status: DC

## 2019-05-13 NOTE — Progress Notes (Addendum)
Subjective:  Patient ID: Alexander Pennington, male    DOB: April 19, 1961  Age: 58 y.o. MRN: [id number]  CC: No chief complaint on file.   HPI Alexander Pennington presents forFollow-up of diabetes. Patient checks blood sugar at home.   120-14  fasting and 160 postprandial. He tells me he lives a little in the summer, so he indulges, but makes up for it by not cheating during the holidays Patient denies symptoms such as polyuria, polydipsia, excessive hunger, nausea No significant hypoglycemic spells noted. Medications reviewed. Pt reports taking them regularly without complication/adverse reaction being reported today.  Feet under evaluation for further surgery currently. Has a steel plate in big toe. Causes pain. May get it removed.   Pain assessment: Cause of pain- peripheral neuropathy, lumbar spinal stenosis Pain location- legs, feet. back Pain on scale of 1-10- 6 Frequency- constant What increases pain-running out of med What makes pain Better-putting feet up Effects on ADL - none Any change in general medical condition-none  Current opioids rx- tramadol 50 , 2 tid; Norco 5, 1 BID # meds rx- 2 Effectiveness of current meds-fair Adverse reactions form pain meds-none Morphine equivalent- 30 +10  Pill count performed-No Last drug screen - N/A. Pt. Sheltering due to COVID ( high risk q87m moderate risk q661mlow risk yearly ) Urine drug screen today- No Was the NCEnglevaleeviewed- Yes  If yes were their any concerning findings? - No   Pain contract signed on:2/17   History Da[place]as a past medical history of CAD (coronary artery disease) (06/2014), Charcot foot due to diabetes mellitus ([place] Complication of anesthesia, Diabetes mellitus ([facility] Diabetic Charcot foot ([facility] [place] Family history of anesthesia complication, Hemorrhagic stroke ([facility] Hyperlipidemia, Hypertension, Kidney stones, Murmur, heart (Fall 2014 - diagnosed), Neuropathy, Obesity, and Sleep apnea.   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   His family history includes CAD (age of onset: 594in his father; Cancer - Colon in his brother; Healthy in his mother; Heart attack (age of onset: 5971in his father; Valvular heart disease (age of onset: 5038in his brother.He reports that he quit smoking about 30 years ago. His smoking use included cigarettes. He has never used smokeless tobacco. He reports current alcohol use. He reports that he does not use drugs.  Current Outpatient Medications on File Prior to Visit  Medication Sig Dispense Refill  . aspirin EC 81 MG tablet Take 1 tablet (81 mg total) by mouth daily. 90 tablet 3  . celecoxib (CELEBREX) 200 MG capsule TAKE 1 CAPSULE TWICE A DAY 180 capsule 3  . Coenzyme Q10 200 MG capsule Take 1 capsule (200 mg total) by mouth daily.    . Evolocumab (REPATHA SURECLICK) 14756G/ML SOAJ Inject 140 mg into the skin every 14 (fourteen) days. 2 pen 11  . Insulin Pen Needle (PEN NEEDLES) 30G X 8 MM MISC 1 Units by Does not apply route daily. 100 each 3  . metFORMIN (GLUMETZA) 1000 MG (MOD) 24 hr tablet Take 1 tablet (1,000 mg total) by mouth 2 (two) times daily with a meal. (Please make your 3 mos Aug appt) 180 tablet 0  . Multiple Vitamins tablet Take 1 tablet by mouth daily.    .[name]  Testosterone (ANDROGEL PUMP) 20.25 MG/ACT (1.62%) GEL Place 4 Applicatorfuls onto the skin daily. 450 g 1  . valsartan (DIOVAN) 160 MG tablet Take 1 tablet (160 mg total) by mouth daily. 30 tablet 11   No current facility-administered medications on file prior to visit.      ROS Review of Systems  Constitutional: Negative.   HENT: Negative.   Eyes: Negative for visual disturbance.  Respiratory: Negative for cough and shortness of breath.   Cardiovascular: Negative for chest pain and leg swelling.  Gastrointestinal: Negative for abdominal pain, diarrhea, nausea and vomiting.  Genitourinary: Negative for difficulty urinating.  Musculoskeletal: Negative for arthralgias and myalgias.  Skin: Negative for rash.  Neurological: Negative for headaches.  Psychiatric/Behavioral: Negative for sleep disturbance.    Objective:  BP (!) 148/70   Wt 272 lb (123.4 kg)   BMI 41.36 kg/m   BP Readings from Last 3 Encounters:  05/13/19 (!) 148/70  01/29/19 (!) 150/70  09/30/18 139/70    Wt Readings from Last 3 Encounters:  05/13/19 272 lb (123.4 kg)  01/29/19 265 lb (120.2 kg)  09/30/18 270 lb (122.5 kg)     Physical Exam  Exam deferred. Pt. Harboring due to COVID 19. Phone visit performed.   Assessment & Plan:   Diagnoses and all orders for this visit:  Diabetic peripheral neuropathy associated with type 2 diabetes mellitus ([place]) -     Bayer DCA Hb A1c Waived; Standing -     CBC with Differential/Platelet; Standing -     CMP14+EGFR; Standing -     Testosterone,Free and Total; Standing -     Lipid panel; Standing -     gabapentin (NEURONTIN) 600 MG tablet; TAKE 2 TABLETS THREE TIMES A DAY -     canagliflozin (INVOKANA) 300 MG TABS tablet; Take 1 tablet (300 mg total) by mouth daily. -     traMADol (ULTRAM) 50 MG tablet; Take 2 tablets (100 mg total) by mouth 3 (three) times daily.  Uncomplicated opioid dependence ([place]) -     Bayer DCA Hb A1c Waived; Standing -     CBC with Differential/Platelet; Standing -     CMP14+EGFR; Standing -     Testosterone,Free and Total; Standing -     Lipid panel; Standing  Diabetic nephropathy associated with type 2 diabetes mellitus ([place]) -     Bayer DCA Hb A1c Waived; Standing -     CBC with  Differential/Platelet; Standing -     CMP14+EGFR; Standing -     Testosterone,Free and Total; Standing -     Lipid panel; Standing  Essential hypertension -     Bayer DCA Hb A1c Waived; Standing -     CBC with Differential/Platelet; Standing -     CMP14+EGFR; Standing -     Testosterone,Free and Total; Standing -     Lipid panel; Standing  Morbid obesity ([place]) -     Bayer DCA Hb A1c Waived; Standing -     CBC with Differential/Platelet; Standing -     CMP14+EGFR; Standing -     Testosterone,Free and Total; Standing -     Lipid panel; Standing  Other orders -     HYDROcodone-acetaminophen (NORCO) 5-325 MG tablet; Take 1 tablet by mouth 2 (two) times daily after a meal. Take for moderate to severe pain -     HYDROcodone-acetaminophen (NORCO) 5-325 MG tablet; Take 1 tablet by mouth 2 (two) times daily. -     HYDROcodone-acetaminophen (NORCO) 5-325 MG tablet; Take  1 tablet by mouth 2 (two) times daily. -     Insulin Glargine (LANTUS SOLOSTAR) 100 UNIT/ML Solostar Pen; Inject 36 Units into the skin daily. -     LORazepam (ATIVAN) 1 MG tablet; Take 1 tablet (1 mg total) by mouth at bedtime as needed.      I have discontinued Mack Guise. Laughter's HYDROcodone-acetaminophen. I have changed his Invokana to canagliflozin. I have also changed his HYDROcodone-acetaminophen, HYDROcodone-acetaminophen, and HYDROcodone-acetaminophen. Additionally, I am having him maintain his Multiple Vitamins, Coenzyme Q10, aspirin EC, valsartan, Pen Needles, Evolocumab, Testosterone, celecoxib, metFORMIN, gabapentin, Lantus SoloStar, LORazepam, and traMADol.  Virtual Visit via telephone Note  I discussed the limitations, risks, security and privacy concerns of performing an evaluation and management service by telephone and the availability of in person appointments. I also discussed with the patient that there may be a patient responsible charge related to this service. The patient expressed understanding and agreed  to proceed. Pt. Is at home. Dr. Livia Snellen is in his office.  Follow Up Instructions:   I discussed the assessment and treatment plan with the patient. The patient was provided an opportunity to ask questions and all were answered. The patient agreed with the plan and demonstrated an understanding of the instructions.   The patient was advised to call back or seek an in-person evaluation if the symptoms worsen or if the condition fails to improve as anticipated.  Visit started: 1:05 Call ended:  1;40 Total minutes including chart review and phone contact time: 45    Follow-up: Return in about 3 months (around 08/13/2019).  Claretta Fraise, M.D.

## 2019-05-13 NOTE — Addendum Note (Signed)
Addended by: Claretta Fraise on: 05/13/2019 02:12 PM   Modules accepted: Orders

## 2019-05-29 ENCOUNTER — Other Ambulatory Visit: Payer: Self-pay | Admitting: Family Medicine

## 2019-05-29 DIAGNOSIS — E1142 Type 2 diabetes mellitus with diabetic polyneuropathy: Secondary | ICD-10-CM

## 2019-05-29 DIAGNOSIS — E118 Type 2 diabetes mellitus with unspecified complications: Secondary | ICD-10-CM

## 2019-05-29 DIAGNOSIS — E1121 Type 2 diabetes mellitus with diabetic nephropathy: Secondary | ICD-10-CM

## 2019-05-29 DIAGNOSIS — Z794 Long term (current) use of insulin: Secondary | ICD-10-CM

## 2019-06-10 DIAGNOSIS — E11610 Type 2 diabetes mellitus with diabetic neuropathic arthropathy: Secondary | ICD-10-CM | POA: Diagnosis not present

## 2019-06-10 DIAGNOSIS — L97513 Non-pressure chronic ulcer of other part of right foot with necrosis of muscle: Secondary | ICD-10-CM | POA: Diagnosis not present

## 2019-06-10 DIAGNOSIS — Z794 Long term (current) use of insulin: Secondary | ICD-10-CM | POA: Diagnosis not present

## 2019-06-10 DIAGNOSIS — E11621 Type 2 diabetes mellitus with foot ulcer: Secondary | ICD-10-CM | POA: Diagnosis not present

## 2019-06-11 ENCOUNTER — Other Ambulatory Visit: Payer: Self-pay | Admitting: Family Medicine

## 2019-06-11 DIAGNOSIS — L97513 Non-pressure chronic ulcer of other part of right foot with necrosis of muscle: Secondary | ICD-10-CM | POA: Diagnosis not present

## 2019-06-11 DIAGNOSIS — E11621 Type 2 diabetes mellitus with foot ulcer: Secondary | ICD-10-CM | POA: Diagnosis not present

## 2019-06-11 DIAGNOSIS — E11610 Type 2 diabetes mellitus with diabetic neuropathic arthropathy: Secondary | ICD-10-CM | POA: Diagnosis not present

## 2019-06-13 ENCOUNTER — Encounter: Payer: Self-pay | Admitting: Family Medicine

## 2019-06-17 DIAGNOSIS — E11621 Type 2 diabetes mellitus with foot ulcer: Secondary | ICD-10-CM | POA: Diagnosis not present

## 2019-06-17 DIAGNOSIS — L97513 Non-pressure chronic ulcer of other part of right foot with necrosis of muscle: Secondary | ICD-10-CM | POA: Diagnosis not present

## 2019-06-17 DIAGNOSIS — E11610 Type 2 diabetes mellitus with diabetic neuropathic arthropathy: Secondary | ICD-10-CM | POA: Diagnosis not present

## 2019-06-22 DIAGNOSIS — L97412 Non-pressure chronic ulcer of right heel and midfoot with fat layer exposed: Secondary | ICD-10-CM | POA: Diagnosis not present

## 2019-06-22 DIAGNOSIS — E11610 Type 2 diabetes mellitus with diabetic neuropathic arthropathy: Secondary | ICD-10-CM | POA: Diagnosis not present

## 2019-06-22 DIAGNOSIS — L97513 Non-pressure chronic ulcer of other part of right foot with necrosis of muscle: Secondary | ICD-10-CM | POA: Diagnosis not present

## 2019-06-22 DIAGNOSIS — E11621 Type 2 diabetes mellitus with foot ulcer: Secondary | ICD-10-CM | POA: Diagnosis not present

## 2019-06-29 DIAGNOSIS — E11610 Type 2 diabetes mellitus with diabetic neuropathic arthropathy: Secondary | ICD-10-CM | POA: Diagnosis not present

## 2019-06-29 DIAGNOSIS — Z794 Long term (current) use of insulin: Secondary | ICD-10-CM | POA: Diagnosis not present

## 2019-06-29 DIAGNOSIS — E11621 Type 2 diabetes mellitus with foot ulcer: Secondary | ICD-10-CM | POA: Diagnosis not present

## 2019-06-29 DIAGNOSIS — L97512 Non-pressure chronic ulcer of other part of right foot with fat layer exposed: Secondary | ICD-10-CM | POA: Diagnosis not present

## 2019-06-30 DIAGNOSIS — L97509 Non-pressure chronic ulcer of other part of unspecified foot with unspecified severity: Secondary | ICD-10-CM | POA: Diagnosis not present

## 2019-07-06 DIAGNOSIS — E11621 Type 2 diabetes mellitus with foot ulcer: Secondary | ICD-10-CM | POA: Diagnosis not present

## 2019-07-06 DIAGNOSIS — E11610 Type 2 diabetes mellitus with diabetic neuropathic arthropathy: Secondary | ICD-10-CM | POA: Diagnosis not present

## 2019-07-06 DIAGNOSIS — L97513 Non-pressure chronic ulcer of other part of right foot with necrosis of muscle: Secondary | ICD-10-CM | POA: Diagnosis not present

## 2019-07-12 ENCOUNTER — Telehealth: Payer: Self-pay | Admitting: Cardiology

## 2019-07-12 DIAGNOSIS — L97519 Non-pressure chronic ulcer of other part of right foot with unspecified severity: Secondary | ICD-10-CM | POA: Diagnosis not present

## 2019-07-12 NOTE — Telephone Encounter (Signed)
Dr. Ellyn Hack, This patient has nonobstructive CAD, last heart cath 2015 with moderate-severe nonobstructive disease treated with medical therapy. He is maintained on only 81 mg ASA. We received a preop clearance requesting to hold ASA for 7 days for blepharoplasty.   He denies anginal symptoms and reports being able to walk up stairs. I do not think additional ischemic evaluation is warranted in the absence of symptoms.

## 2019-07-12 NOTE — Telephone Encounter (Signed)
New message      [facility] Pre-operative Risk Assessment    Request for surgical clearance:  1. What type of surgery is being performed? Blepharoplasty    2. When is this surgery scheduled? 07/26/2019  3. What type of clearance is required (medical clearance vs. Pharmacy clearance to hold med vs. Both)?pharmacy  Are there any medications that need to be held prior to surgery and how long?Aspirin needs to be withheld 7 days prior to surgery  4. Practice name and name of physician performing surgery?[facility], Dr. Magda Kiel   5. What is your office phone [phone]   7.   What is your office fax number [phone]  8.   Anesthesia type (None, local, MAC, general) ? MAC   Alexander Pennington 07/12/2019, 1:29 PM  _________________________________________________________________   (provider comments below)

## 2019-07-12 NOTE — Telephone Encounter (Signed)
   Primary Cardiologist: Glenetta Hew, MD  Chart reviewed as part of pre-operative protocol coverage. Patient was contacted 07/12/2019 in reference to pre-operative risk assessment for pending surgery as outlined below. He denies anginal symptoms when climbing stairs. Given is moderate-severe nonobstructive disease, I reached out to his primary cardiologist. Per Dr. Ellyn Hack, he may hold ASA for 7 days prior to blepharoplasty. In the absence of symptoms, no further ischemic evaluation is needed at this time.   Therefore, based on ACC/AHA guidelines, the patient would be at acceptable risk for the planned procedure without further cardiovascular testing.   I will route this recommendation to the requesting party via Epic fax function and remove from pre-op pool.  Please call with questions.  [place], PA 07/12/2019, 3:40 PM

## 2019-07-12 NOTE — Telephone Encounter (Addendum)
Attempted to call [facility] health care provider's office to confirm fax was received. Was unable to get thru to office. Per chart review, fax was sent via Epic

## 2019-07-13 DIAGNOSIS — E11610 Type 2 diabetes mellitus with diabetic neuropathic arthropathy: Secondary | ICD-10-CM | POA: Diagnosis not present

## 2019-07-13 DIAGNOSIS — L97513 Non-pressure chronic ulcer of other part of right foot with necrosis of muscle: Secondary | ICD-10-CM | POA: Diagnosis not present

## 2019-07-13 DIAGNOSIS — L97411 Non-pressure chronic ulcer of right heel and midfoot limited to breakdown of skin: Secondary | ICD-10-CM | POA: Diagnosis not present

## 2019-07-13 DIAGNOSIS — Z7984 Long term (current) use of oral hypoglycemic drugs: Secondary | ICD-10-CM | POA: Diagnosis not present

## 2019-07-13 DIAGNOSIS — E11621 Type 2 diabetes mellitus with foot ulcer: Secondary | ICD-10-CM | POA: Diagnosis not present

## 2019-07-13 DIAGNOSIS — L97519 Non-pressure chronic ulcer of other part of right foot with unspecified severity: Secondary | ICD-10-CM | POA: Diagnosis not present

## 2019-07-13 DIAGNOSIS — L97512 Non-pressure chronic ulcer of other part of right foot with fat layer exposed: Secondary | ICD-10-CM | POA: Diagnosis not present

## 2019-07-13 NOTE — Telephone Encounter (Signed)
Perfectly acceptable hold aspirin  Glenetta Hew, MD

## 2019-07-14 DIAGNOSIS — L97519 Non-pressure chronic ulcer of other part of right foot with unspecified severity: Secondary | ICD-10-CM | POA: Diagnosis not present

## 2019-07-15 DIAGNOSIS — L97519 Non-pressure chronic ulcer of other part of right foot with unspecified severity: Secondary | ICD-10-CM | POA: Diagnosis not present

## 2019-07-16 DIAGNOSIS — L97519 Non-pressure chronic ulcer of other part of right foot with unspecified severity: Secondary | ICD-10-CM | POA: Diagnosis not present

## 2019-07-17 DIAGNOSIS — L97519 Non-pressure chronic ulcer of other part of right foot with unspecified severity: Secondary | ICD-10-CM | POA: Diagnosis not present

## 2019-07-18 DIAGNOSIS — L97519 Non-pressure chronic ulcer of other part of right foot with unspecified severity: Secondary | ICD-10-CM | POA: Diagnosis not present

## 2019-07-19 DIAGNOSIS — L97519 Non-pressure chronic ulcer of other part of right foot with unspecified severity: Secondary | ICD-10-CM | POA: Diagnosis not present

## 2019-07-20 DIAGNOSIS — Z20828 Contact with and (suspected) exposure to other viral communicable diseases: Secondary | ICD-10-CM | POA: Diagnosis not present

## 2019-07-20 DIAGNOSIS — H02831 Dermatochalasis of right upper eyelid: Secondary | ICD-10-CM | POA: Diagnosis not present

## 2019-07-20 DIAGNOSIS — L97519 Non-pressure chronic ulcer of other part of right foot with unspecified severity: Secondary | ICD-10-CM | POA: Diagnosis not present

## 2019-07-20 DIAGNOSIS — H02834 Dermatochalasis of left upper eyelid: Secondary | ICD-10-CM | POA: Diagnosis not present

## 2019-07-20 DIAGNOSIS — Z01812 Encounter for preprocedural laboratory examination: Secondary | ICD-10-CM | POA: Diagnosis not present

## 2019-07-21 DIAGNOSIS — L97413 Non-pressure chronic ulcer of right heel and midfoot with necrosis of muscle: Secondary | ICD-10-CM | POA: Diagnosis not present

## 2019-07-21 DIAGNOSIS — E11610 Type 2 diabetes mellitus with diabetic neuropathic arthropathy: Secondary | ICD-10-CM | POA: Diagnosis not present

## 2019-07-21 DIAGNOSIS — L97519 Non-pressure chronic ulcer of other part of right foot with unspecified severity: Secondary | ICD-10-CM | POA: Diagnosis not present

## 2019-07-21 DIAGNOSIS — E11621 Type 2 diabetes mellitus with foot ulcer: Secondary | ICD-10-CM | POA: Diagnosis not present

## 2019-07-21 DIAGNOSIS — L97513 Non-pressure chronic ulcer of other part of right foot with necrosis of muscle: Secondary | ICD-10-CM | POA: Diagnosis not present

## 2019-07-22 DIAGNOSIS — L97519 Non-pressure chronic ulcer of other part of right foot with unspecified severity: Secondary | ICD-10-CM | POA: Diagnosis not present

## 2019-07-23 DIAGNOSIS — L97519 Non-pressure chronic ulcer of other part of right foot with unspecified severity: Secondary | ICD-10-CM | POA: Diagnosis not present

## 2019-07-24 DIAGNOSIS — L97519 Non-pressure chronic ulcer of other part of right foot with unspecified severity: Secondary | ICD-10-CM | POA: Diagnosis not present

## 2019-07-25 DIAGNOSIS — L97519 Non-pressure chronic ulcer of other part of right foot with unspecified severity: Secondary | ICD-10-CM | POA: Diagnosis not present

## 2019-07-26 DIAGNOSIS — G629 Polyneuropathy, unspecified: Secondary | ICD-10-CM | POA: Diagnosis not present

## 2019-07-26 DIAGNOSIS — I272 Pulmonary hypertension, unspecified: Secondary | ICD-10-CM | POA: Diagnosis not present

## 2019-07-26 DIAGNOSIS — N529 Male erectile dysfunction, unspecified: Secondary | ICD-10-CM | POA: Diagnosis not present

## 2019-07-26 DIAGNOSIS — Z794 Long term (current) use of insulin: Secondary | ICD-10-CM | POA: Diagnosis not present

## 2019-07-26 DIAGNOSIS — E119 Type 2 diabetes mellitus without complications: Secondary | ICD-10-CM | POA: Diagnosis not present

## 2019-07-26 DIAGNOSIS — Z87891 Personal history of nicotine dependence: Secondary | ICD-10-CM | POA: Diagnosis not present

## 2019-07-26 DIAGNOSIS — I10 Essential (primary) hypertension: Secondary | ICD-10-CM | POA: Diagnosis not present

## 2019-07-26 DIAGNOSIS — I2510 Atherosclerotic heart disease of native coronary artery without angina pectoris: Secondary | ICD-10-CM | POA: Diagnosis not present

## 2019-07-26 DIAGNOSIS — Z8673 Personal history of transient ischemic attack (TIA), and cerebral infarction without residual deficits: Secondary | ICD-10-CM | POA: Diagnosis not present

## 2019-07-26 DIAGNOSIS — L97519 Non-pressure chronic ulcer of other part of right foot with unspecified severity: Secondary | ICD-10-CM | POA: Diagnosis not present

## 2019-07-26 DIAGNOSIS — H02831 Dermatochalasis of right upper eyelid: Secondary | ICD-10-CM | POA: Diagnosis not present

## 2019-07-26 DIAGNOSIS — H02834 Dermatochalasis of left upper eyelid: Secondary | ICD-10-CM | POA: Diagnosis not present

## 2019-07-26 DIAGNOSIS — G4733 Obstructive sleep apnea (adult) (pediatric): Secondary | ICD-10-CM | POA: Diagnosis not present

## 2019-07-27 DIAGNOSIS — L97519 Non-pressure chronic ulcer of other part of right foot with unspecified severity: Secondary | ICD-10-CM | POA: Diagnosis not present

## 2019-07-28 DIAGNOSIS — L97512 Non-pressure chronic ulcer of other part of right foot with fat layer exposed: Secondary | ICD-10-CM | POA: Diagnosis not present

## 2019-07-28 DIAGNOSIS — L97413 Non-pressure chronic ulcer of right heel and midfoot with necrosis of muscle: Secondary | ICD-10-CM | POA: Diagnosis not present

## 2019-07-28 DIAGNOSIS — E11610 Type 2 diabetes mellitus with diabetic neuropathic arthropathy: Secondary | ICD-10-CM | POA: Diagnosis not present

## 2019-07-28 DIAGNOSIS — L97519 Non-pressure chronic ulcer of other part of right foot with unspecified severity: Secondary | ICD-10-CM | POA: Diagnosis not present

## 2019-07-28 DIAGNOSIS — E11621 Type 2 diabetes mellitus with foot ulcer: Secondary | ICD-10-CM | POA: Diagnosis not present

## 2019-07-29 DIAGNOSIS — L97519 Non-pressure chronic ulcer of other part of right foot with unspecified severity: Secondary | ICD-10-CM | POA: Diagnosis not present

## 2019-07-29 DIAGNOSIS — L97509 Non-pressure chronic ulcer of other part of unspecified foot with unspecified severity: Secondary | ICD-10-CM | POA: Diagnosis not present

## 2019-07-30 DIAGNOSIS — L97519 Non-pressure chronic ulcer of other part of right foot with unspecified severity: Secondary | ICD-10-CM | POA: Diagnosis not present

## 2019-07-31 DIAGNOSIS — L97519 Non-pressure chronic ulcer of other part of right foot with unspecified severity: Secondary | ICD-10-CM | POA: Diagnosis not present

## 2019-08-03 ENCOUNTER — Ambulatory Visit (INDEPENDENT_AMBULATORY_CARE_PROVIDER_SITE_OTHER): Payer: BC Managed Care – PPO | Admitting: Family Medicine

## 2019-08-03 ENCOUNTER — Encounter: Payer: Self-pay | Admitting: Family Medicine

## 2019-08-03 ENCOUNTER — Other Ambulatory Visit: Payer: Self-pay

## 2019-08-03 DIAGNOSIS — E1142 Type 2 diabetes mellitus with diabetic polyneuropathy: Secondary | ICD-10-CM

## 2019-08-03 DIAGNOSIS — E1169 Type 2 diabetes mellitus with other specified complication: Secondary | ICD-10-CM

## 2019-08-03 DIAGNOSIS — E785 Hyperlipidemia, unspecified: Secondary | ICD-10-CM | POA: Diagnosis not present

## 2019-08-03 MED ORDER — TRAMADOL HCL 50 MG PO TABS: 100.0000 mg | ORAL_TABLET | Freq: Three times a day (TID) | ORAL | 0 refills | Status: DC

## 2019-08-03 MED ORDER — HYDROCODONE-ACETAMINOPHEN 5-325 MG PO TABS: 1.0000 | ORAL_TABLET | Freq: Every day | ORAL | 0 refills | Status: DC

## 2019-08-03 MED ORDER — LORAZEPAM 1 MG PO TABS: 1.0000 mg | ORAL_TABLET | Freq: Every evening | ORAL | 1 refills | Status: DC | PRN

## 2019-08-03 NOTE — Progress Notes (Signed)
Subjective:    Patient ID: Alexander Pennington, male    DOB: 1961-04-19, 58 y.o.   MRN: [id number]   HPI: Alexander Pennington is a 58 y.o. male presenting for presents forFollow-up of diabetes. Patient checks blood sugar at home.   100-110 fasting and 160-170 postprandial Patient denies symptoms such as polyuria, polydipsia, excessive hunger, nausea No significant hypoglycemic spells noted. Medications reviewed. Pt reports taking them regularly without complication/adverse reaction being reported today.  Currently has an ulcer on the left foot. Using a knee walker. Was 4 cm. Now at 1 cm or less under care of Wound MD.  A1c = 6.5 recently.  Weight is 265 at home.   Pain is at upper back and neck. Hip much better since replacement surgery this summer. Also working from home helps. Pain is moderate. Responds to tramadol. Thinks he can decresae the hydrocodone to hs only.   Depression screen [facility] 2/9 09/30/2018 03/17/2018 12/16/2017 09/17/2017 05/20/2017  Decreased Interest 0 0 0 0 0  Down, Depressed, Hopeless 0 0 0 0 0  PHQ - 2 Score 0 0 0 0 0     Relevant past medical, surgical, family and social history reviewed and updated as indicated.  Interim medical history since our last visit reviewed. Allergies and medications reviewed and updated.  ROS:  Review of Systems  Constitutional: Negative.   HENT: Negative.   Eyes: Negative for visual disturbance.  Respiratory: Negative for cough and shortness of breath.   Cardiovascular: Negative for chest pain and leg swelling.  Gastrointestinal: Negative for abdominal pain, diarrhea, nausea and vomiting.  Genitourinary: Negative for difficulty urinating.  Musculoskeletal: Positive for arthralgias, back pain, myalgias and neck pain.  Skin: Negative for rash.  Neurological: Negative for headaches.  Psychiatric/Behavioral: Negative for sleep disturbance.     Social History   Tobacco Use  Smoking Status Former Smoker  . Types: Cigarettes  . Quit date:  06/13/1988  . Years since quitting: 31.1  Smokeless Tobacco Never Used  Tobacco Comment   Smoked for ~8 yrs.       Objective:     Wt Readings from Last 3 Encounters:  05/13/19 272 lb (123.4 kg)  01/29/19 265 lb (120.2 kg)  09/30/18 270 lb (122.5 kg)     Exam deferred. Pt. Harboring due to COVID 19. Phone visit performed.   Assessment & Plan:   1. Hyperlipidemia associated with type 2 diabetes mellitus (HCC)   2. Diabetic peripheral neuropathy associated with type 2 diabetes mellitus (HCC)     Meds ordered this encounter  Medications  . HYDROcodone-acetaminophen (NORCO) 5-325 MG tablet    Sig: Take 1 tablet by mouth daily.    Dispense:  30 tablet    Refill:  0  . HYDROcodone-acetaminophen (NORCO) 5-325 MG tablet    Sig: Take 1 tablet by mouth daily. Take for moderate to severe pain    Dispense:  30 tablet    Refill:  0  . HYDROcodone-acetaminophen (NORCO) 5-325 MG tablet    Sig: Take 1 tablet by mouth daily for 1 dose.    Dispense:  30 tablet    Refill:  0  . LORazepam (ATIVAN) 1 MG tablet    Sig: Take 1 tablet (1 mg total) by mouth at bedtime as needed.    Dispense:  90 tablet    Refill:  1  . traMADol (ULTRAM) 50 MG tablet    Sig: Take 2 tablets (100 mg total) by mouth 3 (three) times daily.  Dispense:  540 tablet    Refill:  0    Orders Placed This Encounter  Procedures  . Lipid    Order Specific Question:   Has the patient fasted?    Answer:   Yes      Diagnoses and all orders for this visit:  Hyperlipidemia associated with type 2 diabetes mellitus ([place]) -     Lipid  Diabetic peripheral neuropathy associated with type 2 diabetes mellitus (HCC) -     traMADol (ULTRAM) 50 MG tablet; Take 2 tablets (100 mg total) by mouth 3 (three) times daily.  Other orders -     HYDROcodone-acetaminophen (NORCO) 5-325 MG tablet; Take 1 tablet by mouth daily. -     HYDROcodone-acetaminophen (NORCO) 5-325 MG tablet; Take 1 tablet by mouth daily. Take for moderate to  severe pain -     HYDROcodone-acetaminophen (NORCO) 5-325 MG tablet; Take 1 tablet by mouth daily for 1 dose. -     LORazepam (ATIVAN) 1 MG tablet; Take 1 tablet (1 mg total) by mouth at bedtime as needed.    Virtual Visit via telephone Note  I discussed the limitations, risks, security and privacy concerns of performing an evaluation and management service by telephone and the availability of in person appointments. The patient was identified with two identifiers. Pt.expressed understanding and agreed to proceed. Pt. Is at home. Dr. Livia Snellen is in his office.  Follow Up Instructions:   I discussed the assessment and treatment plan with the patient. The patient was provided an opportunity to ask questions and all were answered. The patient agreed with the plan and demonstrated an understanding of the instructions.   The patient was advised to call back or seek an in-person evaluation if the symptoms worsen or if the condition fails to improve as anticipated.   Total minutes including chart review and phone contact time: 25   Follow up plan: Return in about 3 months (around 11/03/2019).  Claretta Fraise, MD [place]

## 2019-08-04 DIAGNOSIS — E11610 Type 2 diabetes mellitus with diabetic neuropathic arthropathy: Secondary | ICD-10-CM | POA: Diagnosis not present

## 2019-08-04 DIAGNOSIS — L97513 Non-pressure chronic ulcer of other part of right foot with necrosis of muscle: Secondary | ICD-10-CM | POA: Diagnosis not present

## 2019-08-04 DIAGNOSIS — E11621 Type 2 diabetes mellitus with foot ulcer: Secondary | ICD-10-CM | POA: Diagnosis not present

## 2019-08-10 DIAGNOSIS — T85848D Pain due to other internal prosthetic devices, implants and grafts, subsequent encounter: Secondary | ICD-10-CM | POA: Diagnosis not present

## 2019-08-10 DIAGNOSIS — M2012 Hallux valgus (acquired), left foot: Secondary | ICD-10-CM | POA: Diagnosis not present

## 2019-08-11 ENCOUNTER — Other Ambulatory Visit: Payer: Self-pay | Admitting: Family Medicine

## 2019-08-11 DIAGNOSIS — E1121 Type 2 diabetes mellitus with diabetic nephropathy: Secondary | ICD-10-CM

## 2019-08-11 DIAGNOSIS — E118 Type 2 diabetes mellitus with unspecified complications: Secondary | ICD-10-CM

## 2019-08-11 DIAGNOSIS — E1142 Type 2 diabetes mellitus with diabetic polyneuropathy: Secondary | ICD-10-CM

## 2019-08-16 ENCOUNTER — Ambulatory Visit: Payer: BC Managed Care – PPO | Admitting: Family Medicine

## 2019-08-16 DIAGNOSIS — L97509 Non-pressure chronic ulcer of other part of unspecified foot with unspecified severity: Secondary | ICD-10-CM | POA: Diagnosis not present

## 2019-08-18 DIAGNOSIS — E11621 Type 2 diabetes mellitus with foot ulcer: Secondary | ICD-10-CM | POA: Diagnosis not present

## 2019-08-18 DIAGNOSIS — L97513 Non-pressure chronic ulcer of other part of right foot with necrosis of muscle: Secondary | ICD-10-CM | POA: Diagnosis not present

## 2019-08-18 DIAGNOSIS — E11610 Type 2 diabetes mellitus with diabetic neuropathic arthropathy: Secondary | ICD-10-CM | POA: Diagnosis not present

## 2019-08-23 DIAGNOSIS — Z01812 Encounter for preprocedural laboratory examination: Secondary | ICD-10-CM | POA: Diagnosis not present

## 2019-08-23 DIAGNOSIS — Z20828 Contact with and (suspected) exposure to other viral communicable diseases: Secondary | ICD-10-CM | POA: Diagnosis not present

## 2019-08-23 DIAGNOSIS — M2012 Hallux valgus (acquired), left foot: Secondary | ICD-10-CM | POA: Diagnosis not present

## 2019-08-23 DIAGNOSIS — T85848D Pain due to other internal prosthetic devices, implants and grafts, subsequent encounter: Secondary | ICD-10-CM | POA: Diagnosis not present

## 2019-08-23 DIAGNOSIS — X58XXXD Exposure to other specified factors, subsequent encounter: Secondary | ICD-10-CM | POA: Diagnosis not present

## 2019-08-25 DIAGNOSIS — L97412 Non-pressure chronic ulcer of right heel and midfoot with fat layer exposed: Secondary | ICD-10-CM | POA: Diagnosis not present

## 2019-08-25 DIAGNOSIS — E11621 Type 2 diabetes mellitus with foot ulcer: Secondary | ICD-10-CM | POA: Diagnosis not present

## 2019-08-25 DIAGNOSIS — E11610 Type 2 diabetes mellitus with diabetic neuropathic arthropathy: Secondary | ICD-10-CM | POA: Diagnosis not present

## 2019-08-27 ENCOUNTER — Other Ambulatory Visit: Payer: Self-pay | Admitting: Family Medicine

## 2019-08-27 DIAGNOSIS — I2510 Atherosclerotic heart disease of native coronary artery without angina pectoris: Secondary | ICD-10-CM | POA: Diagnosis not present

## 2019-08-27 DIAGNOSIS — I509 Heart failure, unspecified: Secondary | ICD-10-CM | POA: Diagnosis not present

## 2019-08-27 DIAGNOSIS — G4733 Obstructive sleep apnea (adult) (pediatric): Secondary | ICD-10-CM | POA: Diagnosis not present

## 2019-08-27 DIAGNOSIS — I272 Pulmonary hypertension, unspecified: Secondary | ICD-10-CM | POA: Diagnosis not present

## 2019-08-27 DIAGNOSIS — T85848D Pain due to other internal prosthetic devices, implants and grafts, subsequent encounter: Secondary | ICD-10-CM | POA: Diagnosis not present

## 2019-08-27 DIAGNOSIS — Z794 Long term (current) use of insulin: Secondary | ICD-10-CM

## 2019-08-27 DIAGNOSIS — M2012 Hallux valgus (acquired), left foot: Secondary | ICD-10-CM | POA: Diagnosis not present

## 2019-08-27 DIAGNOSIS — Z8673 Personal history of transient ischemic attack (TIA), and cerebral infarction without residual deficits: Secondary | ICD-10-CM | POA: Diagnosis not present

## 2019-08-27 DIAGNOSIS — E119 Type 2 diabetes mellitus without complications: Secondary | ICD-10-CM | POA: Diagnosis not present

## 2019-08-27 DIAGNOSIS — X58XXXA Exposure to other specified factors, initial encounter: Secondary | ICD-10-CM | POA: Diagnosis not present

## 2019-08-27 DIAGNOSIS — E1121 Type 2 diabetes mellitus with diabetic nephropathy: Secondary | ICD-10-CM

## 2019-08-27 DIAGNOSIS — E118 Type 2 diabetes mellitus with unspecified complications: Secondary | ICD-10-CM

## 2019-08-27 DIAGNOSIS — T85848A Pain due to other internal prosthetic devices, implants and grafts, initial encounter: Secondary | ICD-10-CM | POA: Diagnosis not present

## 2019-08-27 DIAGNOSIS — I110 Hypertensive heart disease with heart failure: Secondary | ICD-10-CM | POA: Diagnosis not present

## 2019-08-27 DIAGNOSIS — E1142 Type 2 diabetes mellitus with diabetic polyneuropathy: Secondary | ICD-10-CM

## 2019-08-27 DIAGNOSIS — Z87891 Personal history of nicotine dependence: Secondary | ICD-10-CM | POA: Diagnosis not present

## 2019-09-01 DIAGNOSIS — E11610 Type 2 diabetes mellitus with diabetic neuropathic arthropathy: Secondary | ICD-10-CM | POA: Diagnosis not present

## 2019-09-01 DIAGNOSIS — E11621 Type 2 diabetes mellitus with foot ulcer: Secondary | ICD-10-CM | POA: Diagnosis not present

## 2019-09-01 DIAGNOSIS — L97412 Non-pressure chronic ulcer of right heel and midfoot with fat layer exposed: Secondary | ICD-10-CM | POA: Diagnosis not present

## 2019-09-08 ENCOUNTER — Other Ambulatory Visit: Payer: Self-pay | Admitting: Family Medicine

## 2019-09-08 DIAGNOSIS — E349 Endocrine disorder, unspecified: Secondary | ICD-10-CM

## 2019-09-08 DIAGNOSIS — E11621 Type 2 diabetes mellitus with foot ulcer: Secondary | ICD-10-CM | POA: Diagnosis not present

## 2019-09-08 DIAGNOSIS — L97513 Non-pressure chronic ulcer of other part of right foot with necrosis of muscle: Secondary | ICD-10-CM | POA: Diagnosis not present

## 2019-09-08 DIAGNOSIS — E11610 Type 2 diabetes mellitus with diabetic neuropathic arthropathy: Secondary | ICD-10-CM | POA: Diagnosis not present

## 2019-09-13 ENCOUNTER — Other Ambulatory Visit: Payer: Self-pay | Admitting: Family Medicine

## 2019-09-13 DIAGNOSIS — E349 Endocrine disorder, unspecified: Secondary | ICD-10-CM

## 2019-09-13 DIAGNOSIS — E1142 Type 2 diabetes mellitus with diabetic polyneuropathy: Secondary | ICD-10-CM

## 2019-09-13 MED ORDER — CELECOXIB 200 MG PO CAPS: 200.0000 mg | ORAL_CAPSULE | Freq: Two times a day (BID) | ORAL | 0 refills | Status: DC

## 2019-09-13 MED ORDER — CANAGLIFLOZIN 300 MG PO TABS: 300.0000 mg | ORAL_TABLET | Freq: Every day | ORAL | 0 refills | Status: DC

## 2019-09-13 NOTE — Telephone Encounter (Signed)
What is the name of the medication?Testosterone (ANDROGEL PUMP) 20.25 MG/ACT (1.62%) GEL Alexander Pennington (CELEBREX) 200 MG capsule  canagliflozin (INVOKANA) 300 MG TABS tablet  traMADol (ULTRAM) 50 MG tablet   Have you contacted your pharmacy to request a refill? Yes, he has met his out of pocket for 2020 would like these meds to be refilled before the end of the year  Which pharmacy would you like this sent to?  [facility] [place]  [address] [place] [address]   Patient notified that their request is being sent to the clinical staff for review and that they should receive a call once it is complete. If they do not receive a call within 24 hours they can check with their pharmacy or our office.

## 2019-09-27 ENCOUNTER — Other Ambulatory Visit: Payer: Self-pay | Admitting: *Deleted

## 2019-09-27 DIAGNOSIS — E1142 Type 2 diabetes mellitus with diabetic polyneuropathy: Secondary | ICD-10-CM

## 2019-11-12 IMAGING — DX DG CHEST 2V
2 series · 2 of 2 positions shown · non-contrast
Comparison: 11/05/04

CLINICAL DATA: Preoperative evaluation for upcoming hip replacement

EXAM:
CHEST - 2 VIEW

[chest pa]
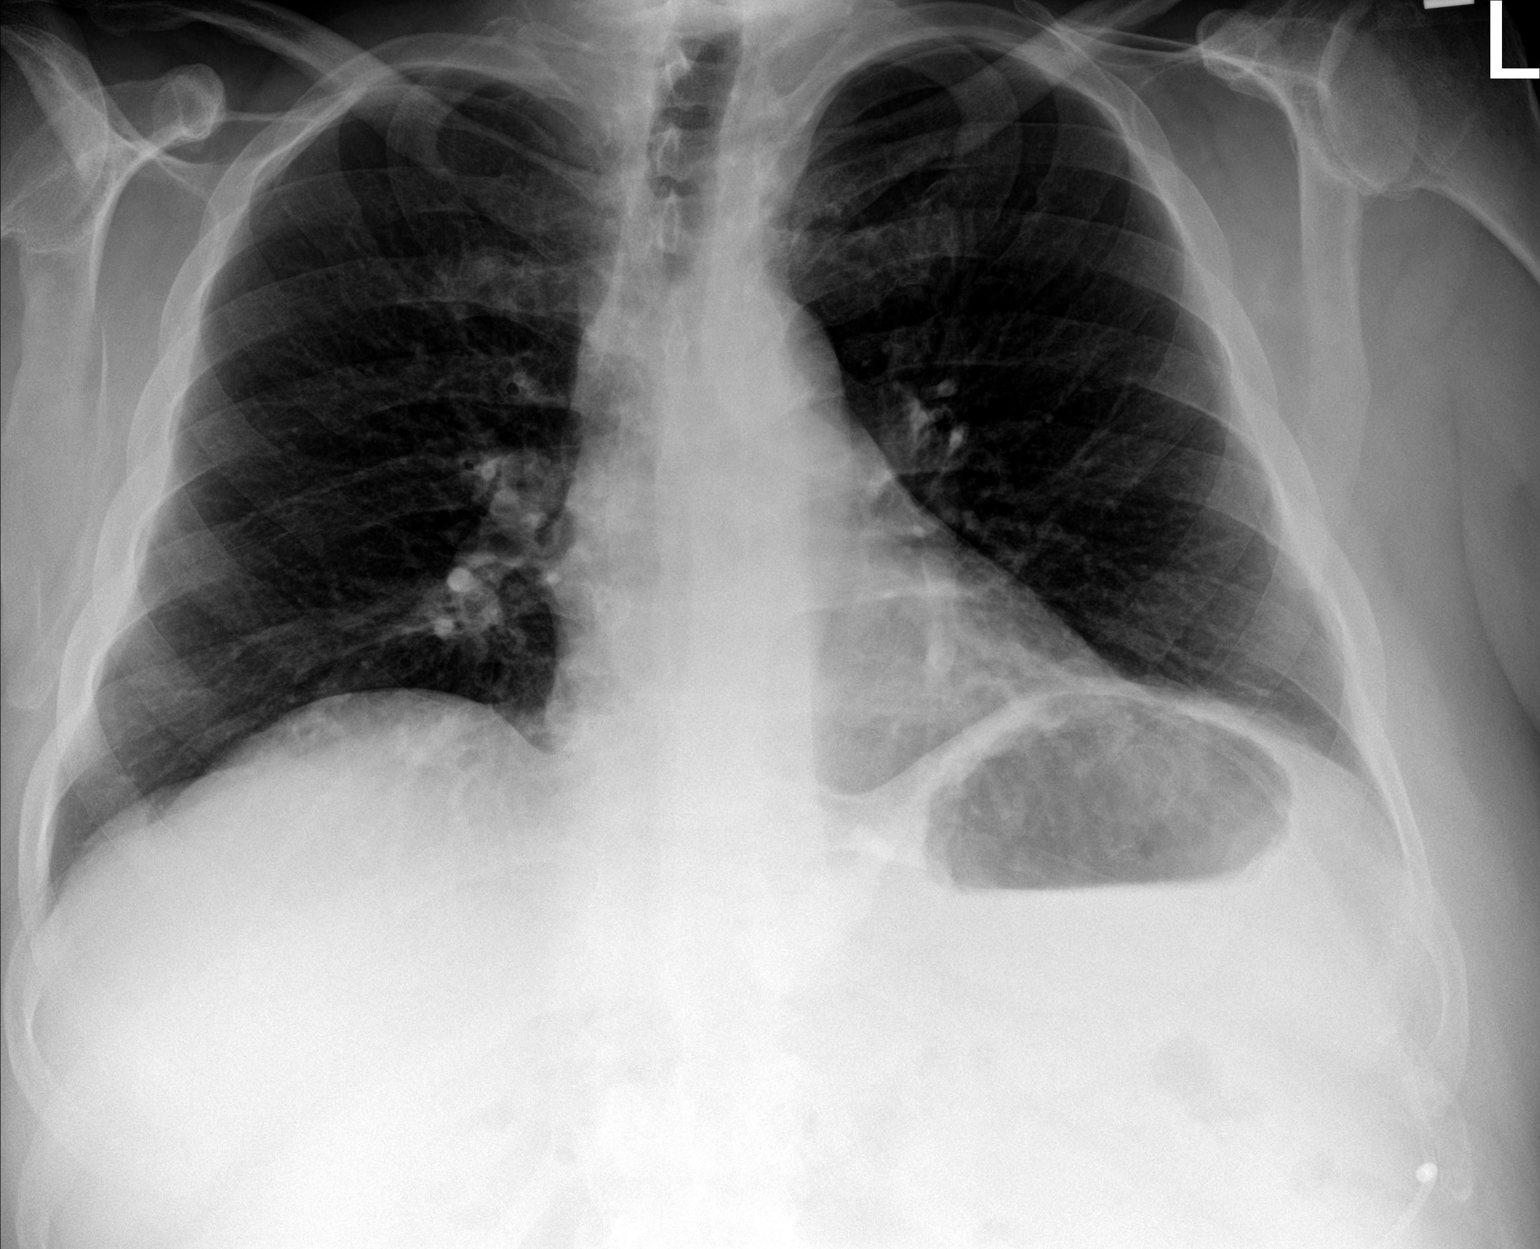

[chest lat]
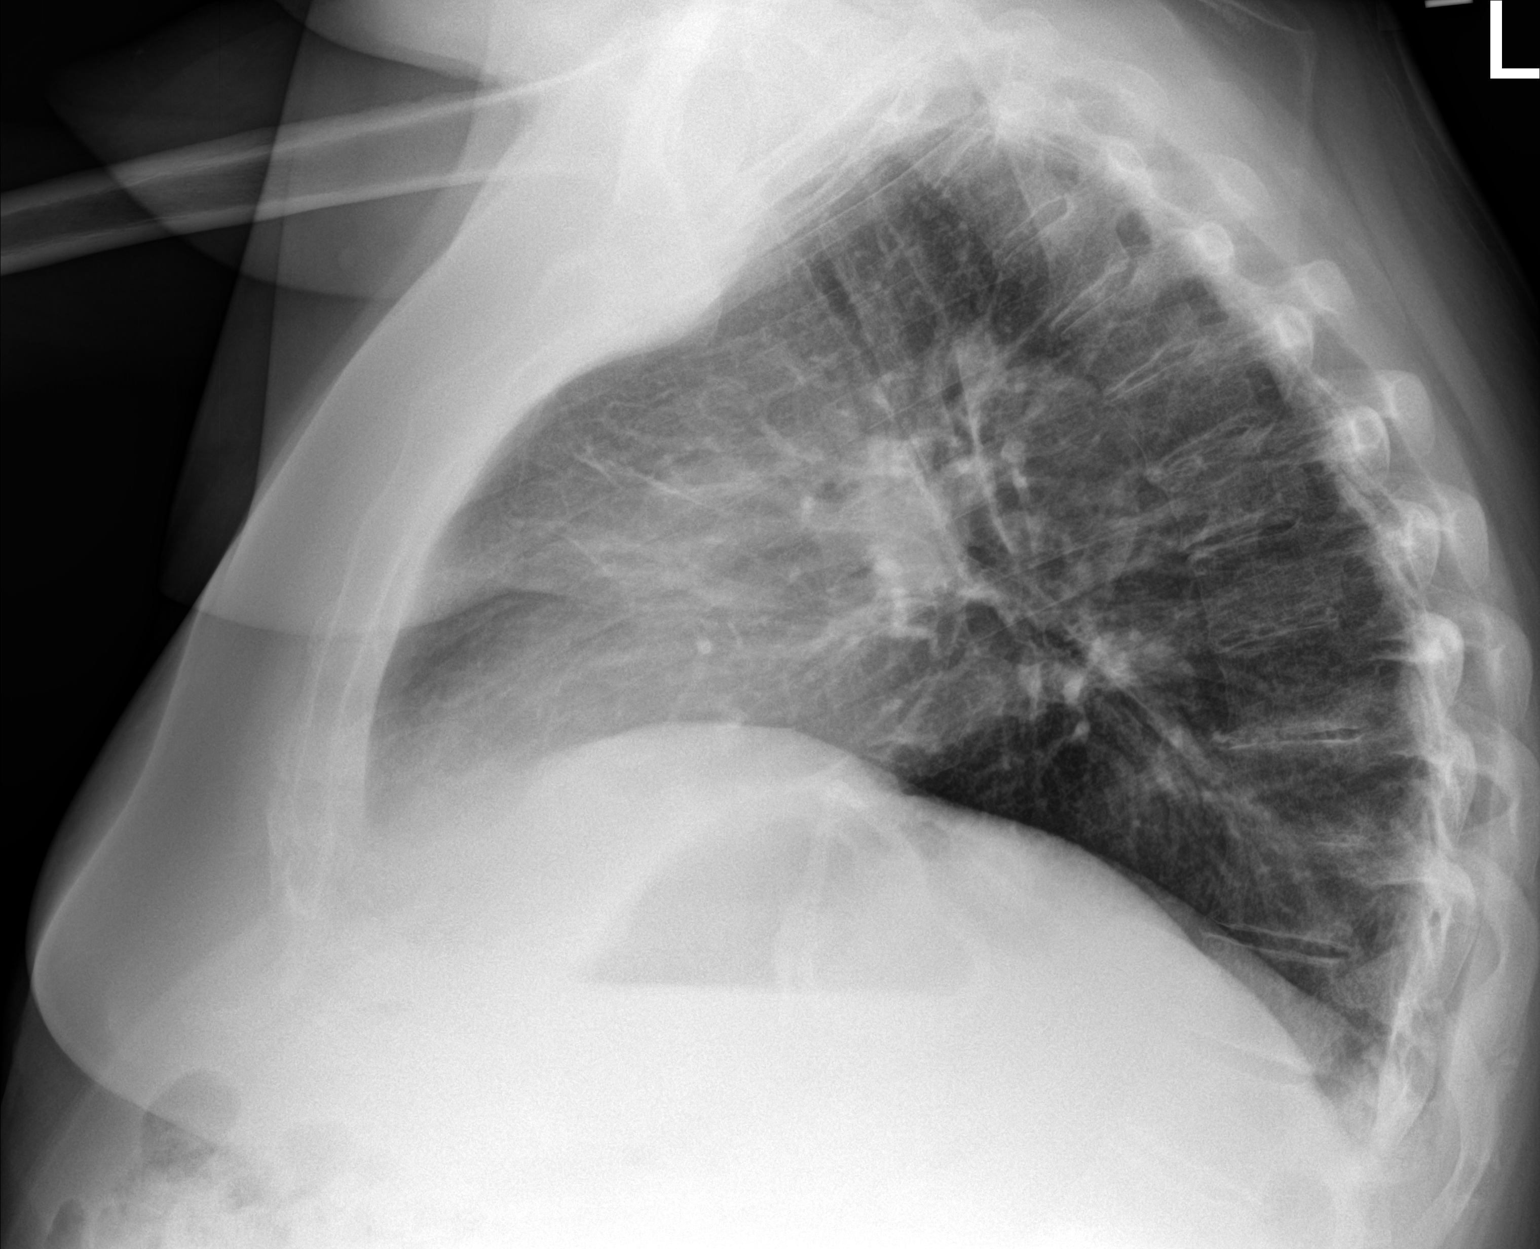

[2 of 2 positions shown; findings below may reference images not displayed]

FINDINGS: Cardiac shadows within normal limits. The lungs are clear
bilaterally. No focal infiltrate or sizable effusion is noted.
Degenerative changes of the thoracic spine are seen.
IMPRESSION: No acute abnormality noted.

## 2019-11-17 ENCOUNTER — Encounter: Payer: Self-pay | Admitting: Family Medicine

## 2019-11-17 ENCOUNTER — Ambulatory Visit (INDEPENDENT_AMBULATORY_CARE_PROVIDER_SITE_OTHER): Payer: BC Managed Care – PPO | Admitting: Family Medicine

## 2019-11-17 DIAGNOSIS — E349 Endocrine disorder, unspecified: Secondary | ICD-10-CM

## 2019-11-17 DIAGNOSIS — E1142 Type 2 diabetes mellitus with diabetic polyneuropathy: Secondary | ICD-10-CM | POA: Diagnosis not present

## 2019-11-17 MED ORDER — TRAMADOL HCL 50 MG PO TABS: 100.0000 mg | ORAL_TABLET | Freq: Three times a day (TID) | ORAL | 0 refills | Status: DC

## 2019-11-17 MED ORDER — HYDROCODONE-ACETAMINOPHEN 5-325 MG PO TABS: 1.0000 | ORAL_TABLET | Freq: Every day | ORAL | 0 refills | Status: DC

## 2019-11-17 MED ORDER — GABAPENTIN 600 MG PO TABS: ORAL_TABLET | ORAL | 3 refills | Status: DC

## 2019-11-17 MED ORDER — FOLIC ACID-VIT B6-VIT B12 2.5-25-1 MG PO TABS: 1.0000 | ORAL_TABLET | Freq: Every day | ORAL | 3 refills | Status: DC

## 2019-11-17 MED ORDER — TESTOSTERONE 20.25 MG/ACT (1.62%) TD GEL: TRANSDERMAL | 1 refills | Status: DC

## 2019-11-17 MED ORDER — TADALAFIL 20 MG PO TABS: 20.0000 mg | ORAL_TABLET | ORAL | 3 refills | Status: DC | PRN

## 2019-11-17 NOTE — Progress Notes (Signed)
Subjective:    Patient ID: Alexander Pennington, male    DOB: 04-09-1961, 59 y.o.   MRN: [id number]   HPI: IRVAN TIEDT is a 59 y.o. male presenting for presents forFollow-up of diabetes. Patient checks blood sugar at home.   133, 156, 105-120 fasting and not checking postprandial Patient denies symptoms such as polyuria, polydipsia, excessive hunger, nausea No significant hypoglycemic spells noted. Medications reviewed. Pt reports taking them regularly without complication/adverse reaction being reported today.  Lady friend exposed to CoV[employer]. Having to self quarantine.   Pain staying stable. One hydrocodone is sufficient. Daily taking the tramadol 2 TID and gabapentin.  MME=35. Lorazepam equivalent is 1. One discrepancy on PDMP noted. Pt. Filled scrip for oxycodone after surgery   Follow up for testosterone deficiency: Pt. Using medication as directed. Denies any sx referrable to DVT such as edema or erythema of legs. No dyspnea or chest pain. Energy level reported as being good. Libido is normal and denies E.D. Feels strength is adequate and improved from baseline.    Depression screen [facility] 2/9 09/30/2018 03/17/2018 12/16/2017 09/17/2017 05/20/2017  Decreased Interest 0 0 0 0 0  Down, Depressed, Hopeless 0 0 0 0 0  PHQ - 2 Score 0 0 0 0 0     Relevant past medical, surgical, family and social history reviewed and updated as indicated.  Interim medical history since our last visit reviewed. Allergies and medications reviewed and updated.  ROS:  Review of Systems  Constitutional: Negative.   HENT: Negative.   Eyes: Negative for visual disturbance.  Respiratory: Negative for cough and shortness of breath.   Cardiovascular: Negative for chest pain and leg swelling.  Gastrointestinal: Negative for abdominal pain, diarrhea, nausea and vomiting.  Genitourinary: Negative for difficulty urinating.  Musculoskeletal: Negative for arthralgias and myalgias.  Skin: Negative for rash.  Neurological:  Negative for headaches.  Psychiatric/Behavioral: Negative for sleep disturbance.     Social History   Tobacco Use  Smoking Status Former Smoker  . Types: Cigarettes  . Quit date: 06/13/1988  . Years since quitting: 31.4  Smokeless Tobacco Never Used  Tobacco Comment   Smoked for ~8 yrs.       Objective:     Wt Readings from Last 3 Encounters:  05/13/19 272 lb (123.4 kg)  01/29/19 265 lb (120.2 kg)  09/30/18 270 lb (122.5 kg)     Exam deferred. Pt. Harboring due to COVID 19. Phone visit performed.   Assessment & Plan:   1. Testosterone deficiency   2. Diabetic peripheral neuropathy associated with type 2 diabetes mellitus (HCC)     Meds ordered this encounter  Medications  . Testosterone 20.25 MG/ACT (1.62%) GEL    Sig: APPLY 4 PUMPS ONTO THE SKIN DAILY    Dispense:  450 g    Refill:  1  . traMADol (ULTRAM) 50 MG tablet    Sig: Take 2 tablets (100 mg total) by mouth 3 (three) times daily.    Dispense:  540 tablet    Refill:  0  . HYDROcodone-acetaminophen (NORCO) 5-325 MG tablet    Sig: Take 1 tablet by mouth daily.    Dispense:  30 tablet    Refill:  0  . HYDROcodone-acetaminophen (NORCO) 5-325 MG tablet    Sig: Take 1 tablet by mouth daily. Take for moderate to severe pain    Dispense:  30 tablet    Refill:  0  . HYDROcodone-acetaminophen (NORCO) 5-325 MG tablet    Sig:  Take 1 tablet by mouth daily.    Dispense:  30 tablet    Refill:  0  . Folic Acid-Vit B5-ZWC H85 ([name]) 2.5-25-1 MG TABS tablet    Sig: Take 1 tablet by mouth daily.    Dispense:  90 tablet    Refill:  3  . tadalafil (CIALIS) 20 MG tablet    Sig: Take 1 tablet (20 mg total) by mouth every other day as needed for erectile dysfunction.    Dispense:  24 tablet    Refill:  3  . gabapentin (NEURONTIN) 600 MG tablet    Sig: TAKE 2 TABLETS THREE TIMES A DAY    Dispense:  540 tablet    Refill:  3    No orders of the defined types were placed in this encounter.     Diagnoses and all  orders for this visit:  Testosterone deficiency -     Testosterone 20.25 MG/ACT (1.62%) GEL; APPLY 4 PUMPS ONTO THE SKIN DAILY  Diabetic peripheral neuropathy associated with type 2 diabetes mellitus (HCC) -     traMADol (ULTRAM) 50 MG tablet; Take 2 tablets (100 mg total) by mouth 3 (three) times daily. -     HYDROcodone-acetaminophen (NORCO) 5-325 MG tablet; Take 1 tablet by mouth daily. -     HYDROcodone-acetaminophen (NORCO) 5-325 MG tablet; Take 1 tablet by mouth daily. Take for moderate to severe pain -     HYDROcodone-acetaminophen (NORCO) 5-325 MG tablet; Take 1 tablet by mouth daily. -     gabapentin (NEURONTIN) 600 MG tablet; TAKE 2 TABLETS THREE TIMES A DAY  Other orders -     Folic Acid-Vit I7-POE U23 ([name]) 2.5-25-1 MG TABS tablet; Take 1 tablet by mouth daily. -     tadalafil (CIALIS) 20 MG tablet; Take 1 tablet (20 mg total) by mouth every other day as needed for erectile dysfunction.    Virtual Visit via telephone Note  I discussed the limitations, risks, security and privacy concerns of performing an evaluation and management service by telephone and the availability of in person appointments. The patient was identified with two identifiers. Pt.expressed understanding and agreed to proceed. Pt. Is at home. Dr. Livia Snellen is in his office.  Follow Up Instructions:   I discussed the assessment and treatment plan with the patient. The patient was provided an opportunity to ask questions and all were answered. The patient agreed with the plan and demonstrated an understanding of the instructions.   The patient was advised to call back or seek an in-person evaluation if the symptoms worsen or if the condition fails to improve as anticipated.   Total minutes including chart review and phone contact time: 25   Follow up plan: No follow-ups on file.  Claretta Fraise, MD [place]

## 2019-11-23 ENCOUNTER — Telehealth: Payer: Self-pay | Admitting: Family Medicine

## 2019-11-23 ENCOUNTER — Telehealth: Payer: Self-pay | Admitting: *Deleted

## 2019-11-23 NOTE — Telephone Encounter (Signed)
We do not a PA initiated at this time = once [facility] / insurance sends denial - or It comes into our cover my meds work que - then we will try to get PA approved.

## 2019-11-23 NOTE — Telephone Encounter (Signed)
PA started for Tadalafil 20 mg - sent to plan   Key: [id number]

## 2019-11-23 NOTE — Telephone Encounter (Signed)
  Medication Request  11/23/2019  What is the name of the medication? tadalafil (CIALIS) 20 MG tablet  Have you contacted your pharmacy to request a refill? Resend pa  Which pharmacy would you like this sent to[facility]    Patient notified that their request is being sent to the clinical staff for review and that they should receive a call once it is complete. If they do not receive a call within 24 hours they can check with their pharmacy or our office.

## 2019-11-24 ENCOUNTER — Other Ambulatory Visit: Payer: Self-pay | Admitting: Family Medicine

## 2019-11-24 MED ORDER — TADALAFIL 20 MG PO TABS: 20.0000 mg | ORAL_TABLET | ORAL | 3 refills | Status: DC | PRN

## 2019-11-24 NOTE — Telephone Encounter (Signed)
I sent in the requested prescription 

## 2019-11-24 NOTE — Addendum Note (Signed)
Addended by: Lorelee Cover C on: 11/24/2019 03:02 PM   Modules accepted: Orders

## 2019-11-24 NOTE — Telephone Encounter (Signed)
Please contact the patient, see if he wants to price it through [facility]. One of my pt.s was able to use that service and get it for $14/ month

## 2019-11-24 NOTE — Telephone Encounter (Signed)
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 Other coverage conditions apply.  Coverage cannot be authorized at this time.

## 2019-11-24 NOTE — Telephone Encounter (Signed)
Patient aware and verbalizes understanding.  States he just got off the phone with [facility] and they said that the rx was approved but they need the provider to re send it.

## 2019-11-24 NOTE — Addendum Note (Signed)
Addended by: Mechele Claude on: 11/24/2019 05:27 PM   Modules accepted: Orders

## 2020-01-05 ENCOUNTER — Telehealth: Payer: Self-pay

## 2020-01-05 NOTE — Telephone Encounter (Signed)
Called and lmomed the pt to see why they stopped taking the repatha

## 2020-03-09 ENCOUNTER — Telehealth: Payer: Self-pay | Admitting: Family Medicine

## 2020-03-09 ENCOUNTER — Other Ambulatory Visit: Payer: Self-pay | Admitting: Family Medicine

## 2020-03-09 DIAGNOSIS — Z125 Encounter for screening for malignant neoplasm of prostate: Secondary | ICD-10-CM

## 2020-03-09 DIAGNOSIS — E559 Vitamin D deficiency, unspecified: Secondary | ICD-10-CM

## 2020-03-09 DIAGNOSIS — E1142 Type 2 diabetes mellitus with diabetic polyneuropathy: Secondary | ICD-10-CM

## 2020-03-09 DIAGNOSIS — I10 Essential (primary) hypertension: Secondary | ICD-10-CM

## 2020-03-09 DIAGNOSIS — E349 Endocrine disorder, unspecified: Secondary | ICD-10-CM

## 2020-03-09 DIAGNOSIS — E1169 Type 2 diabetes mellitus with other specified complication: Secondary | ICD-10-CM

## 2020-03-09 DIAGNOSIS — E785 Hyperlipidemia, unspecified: Secondary | ICD-10-CM

## 2020-03-09 NOTE — Telephone Encounter (Signed)
Pt aware labs ordered

## 2020-03-09 NOTE — Telephone Encounter (Signed)
I sent in the orders. Thanks

## 2020-03-23 ENCOUNTER — Encounter: Payer: Self-pay | Admitting: *Deleted

## 2020-03-23 ENCOUNTER — Other Ambulatory Visit: Payer: Self-pay

## 2020-03-23 ENCOUNTER — Ambulatory Visit (INDEPENDENT_AMBULATORY_CARE_PROVIDER_SITE_OTHER): Payer: BC Managed Care – PPO | Admitting: Family Medicine

## 2020-03-23 ENCOUNTER — Encounter: Payer: Self-pay | Admitting: Family Medicine

## 2020-03-23 VITALS — BP 171/81 | HR 78 | Temp 98.6°F | Resp 20 | Ht 68.0 in | Wt 280.1 lb

## 2020-03-23 DIAGNOSIS — I2510 Atherosclerotic heart disease of native coronary artery without angina pectoris: Secondary | ICD-10-CM | POA: Diagnosis not present

## 2020-03-23 DIAGNOSIS — F1120 Opioid dependence, uncomplicated: Secondary | ICD-10-CM | POA: Diagnosis not present

## 2020-03-23 DIAGNOSIS — E1142 Type 2 diabetes mellitus with diabetic polyneuropathy: Secondary | ICD-10-CM

## 2020-03-23 DIAGNOSIS — E118 Type 2 diabetes mellitus with unspecified complications: Secondary | ICD-10-CM

## 2020-03-23 DIAGNOSIS — Z0001 Encounter for general adult medical examination with abnormal findings: Secondary | ICD-10-CM

## 2020-03-23 DIAGNOSIS — Z0000 Encounter for general adult medical examination without abnormal findings: Secondary | ICD-10-CM

## 2020-03-23 DIAGNOSIS — E1121 Type 2 diabetes mellitus with diabetic nephropathy: Secondary | ICD-10-CM

## 2020-03-23 DIAGNOSIS — E785 Hyperlipidemia, unspecified: Secondary | ICD-10-CM

## 2020-03-23 DIAGNOSIS — I10 Essential (primary) hypertension: Secondary | ICD-10-CM

## 2020-03-23 DIAGNOSIS — E349 Endocrine disorder, unspecified: Secondary | ICD-10-CM | POA: Diagnosis not present

## 2020-03-23 DIAGNOSIS — E1169 Type 2 diabetes mellitus with other specified complication: Secondary | ICD-10-CM

## 2020-03-23 DIAGNOSIS — Z794 Long term (current) use of insulin: Secondary | ICD-10-CM

## 2020-03-23 DIAGNOSIS — Z79899 Other long term (current) drug therapy: Secondary | ICD-10-CM

## 2020-03-23 LAB — CBC WITH DIFFERENTIAL/PLATELET
Basophils Absolute: 0.1 10*3/uL (ref 0.0–0.2)
Basos: 1 %
EOS (ABSOLUTE): 0.3 10*3/uL (ref 0.0–0.4)
Eos: 4 %
Hematocrit: 51.5 % — ABNORMAL HIGH (ref 37.5–51.0)
Hemoglobin: 18 g/dL — ABNORMAL HIGH (ref 13.0–17.7)
Immature Grans (Abs): 0 10*3/uL (ref 0.0–0.1)
Immature Granulocytes: 0 %
Lymphocytes Absolute: 1.6 10*3/uL (ref 0.7–3.1)
Lymphs: 26 %
MCH: 31.9 pg (ref 26.6–33.0)
MCHC: 35 g/dL (ref 31.5–35.7)
MCV: 91 fL (ref 79–97)
Monocytes Absolute: 0.5 10*3/uL (ref 0.1–0.9)
Monocytes: 9 %
Neutrophils Absolute: 3.6 10*3/uL (ref 1.4–7.0)
Neutrophils: 60 %
Platelets: 167 10*3/uL (ref 150–450)
RBC: 5.65 x10E6/uL (ref 4.14–5.80)
RDW: 12.7 % (ref 11.6–15.4)
WBC: 6 10*3/uL (ref 3.4–10.8)

## 2020-03-23 LAB — CMP14+EGFR
ALT: 20 IU/L (ref 0–44)
AST: 22 IU/L (ref 0–40)
Albumin/Globulin Ratio: 1.8 (ref 1.2–2.2)
Albumin: 4.4 g/dL (ref 3.8–4.8)
Alkaline Phosphatase: 74 IU/L (ref 48–121)
BUN/Creatinine Ratio: 26 — ABNORMAL HIGH (ref 10–24)
BUN: 18 mg/dL (ref 8–27)
Bilirubin Total: 0.4 mg/dL (ref 0.0–1.2)
CO2: 23 mmol/L (ref 20–29)
Calcium: 9.4 mg/dL (ref 8.6–10.2)
Chloride: 99 mmol/L (ref 96–106)
Creatinine, Ser: 0.68 mg/dL — ABNORMAL LOW (ref 0.76–1.27)
GFR calc Af Amer: 119 mL/min/{1.73_m2} (ref >59)
GFR calc non Af Amer: 103 mL/min/{1.73_m2} (ref >59)
Globulin, Total: 2.5 g/dL (ref 1.5–4.5)
Glucose: 173 mg/dL — ABNORMAL HIGH (ref 65–99)
Potassium: 4.9 mmol/L (ref 3.5–5.2)
Sodium: 137 mmol/L (ref 134–144)
Total Protein: 6.9 g/dL (ref 6.0–8.5)

## 2020-03-23 LAB — SPECIMEN STATUS REPORT

## 2020-03-23 LAB — LIPID PANEL
Chol/HDL Ratio: 5.7 ratio — ABNORMAL HIGH (ref 0.0–5.0)
Cholesterol, Total: 195 mg/dL (ref 100–199)
HDL: 34 mg/dL — ABNORMAL LOW (ref >39)
LDL Chol Calc (NIH): 130 mg/dL — ABNORMAL HIGH (ref 0–99)
Triglycerides: 173 mg/dL — ABNORMAL HIGH (ref 0–149)
VLDL Cholesterol Cal: 31 mg/dL (ref 5–40)

## 2020-03-23 LAB — PSA TOTAL (REFLEX TO FREE): Prostate Specific Ag, Serum: 0.5 ng/mL (ref 0.0–4.0)

## 2020-03-23 LAB — TSH: TSH: 2.53 u[IU]/mL (ref 0.450–4.500)

## 2020-03-23 LAB — VITAMIN D 25 HYDROXY (VIT D DEFICIENCY, FRACTURES): Vit D, 25-Hydroxy: 62 ng/mL (ref 30.0–100.0)

## 2020-03-23 LAB — HGB A1C W/O EAG: Hgb A1c MFr Bld: 7.9 % — ABNORMAL HIGH (ref 4.8–5.6)

## 2020-03-23 MED ORDER — METFORMIN HCL ER 500 MG PO TB24: 500.0000 mg | ORAL_TABLET | Freq: Two times a day (BID) | ORAL | 1 refills | Status: DC

## 2020-03-23 MED ORDER — HYDROCODONE-ACETAMINOPHEN 5-325 MG PO TABS: 1.0000 | ORAL_TABLET | Freq: Every day | ORAL | 0 refills | Status: DC

## 2020-03-23 MED ORDER — REPATHA SURECLICK 140 MG/ML ~~LOC~~ SOAJ: 140.0000 mg | SUBCUTANEOUS | 11 refills | Status: DC

## 2020-03-23 MED ORDER — TESTOSTERONE 20.25 MG/ACT (1.62%) TD GEL: TRANSDERMAL | 1 refills | Status: DC

## 2020-03-23 MED ORDER — CANAGLIFLOZIN 300 MG PO TABS: 300.0000 mg | ORAL_TABLET | Freq: Every day | ORAL | 0 refills | Status: DC

## 2020-03-23 MED ORDER — GABAPENTIN 600 MG PO TABS: ORAL_TABLET | ORAL | 3 refills | Status: DC

## 2020-03-23 MED ORDER — TRAMADOL HCL 50 MG PO TABS: 100.0000 mg | ORAL_TABLET | Freq: Three times a day (TID) | ORAL | 0 refills | Status: DC

## 2020-03-23 MED ORDER — LANTUS SOLOSTAR 100 UNIT/ML ~~LOC~~ SOPN: 40.0000 [IU] | PEN_INJECTOR | Freq: Every day | SUBCUTANEOUS | 3 refills | Status: DC

## 2020-03-23 MED ORDER — CELECOXIB 200 MG PO CAPS: 200.0000 mg | ORAL_CAPSULE | Freq: Two times a day (BID) | ORAL | 0 refills | Status: DC

## 2020-03-23 MED ORDER — VALSARTAN 160 MG PO TABS: 160.0000 mg | ORAL_TABLET | Freq: Every day | ORAL | 11 refills | Status: DC

## 2020-03-23 NOTE — Progress Notes (Signed)
CASE ID [id number]- APPROVED 02/22/20-03/23/21    KEY: [id number]  RX # [id number]  CELEBREX 200 MG CAPSULES

## 2020-03-23 NOTE — Progress Notes (Signed)
CASE ID [id number]  APPROVED  02/22/20- 03/23/21

## 2020-03-23 NOTE — Progress Notes (Signed)
Subjective:  Patient ID: Alexander Pennington, male    DOB: 08/05/1961  Age: 59 y.o. MRN: [id number]  CC: Annual Exam   HPI Alexander Pennington presents for Annual exam as well as follow-up of diabetes. Patient checks blood sugar at home.  150s  fasting and  Postprandial. Tends to drop and cause sweats and nausea in the midmorning and afternoon. He reduced his metformin because it seemed to be making him crave carbs.  His glucose has been running in the 200 range until he reduced the Metformin.  Now his levels are down to around 150.  Is stable with regard to his pain he is still taking the hydrocodone.  This is the result of diabetic neuropathy.  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  The hydrocodone helps keep the pain at [name].  Patient also takes lorazepam at bedtime as needed.  He says he has plenty of that because he has not been taking it regularly.  He does not ask for refills today.  He understands the refills can only be given at the time of an office visit as well.  He only takes it when he cannot shut his mind down to go to sleep. Patient denies symptoms such as polyuria, polydipsia, excessive hunger, nausea Medications reviewed. Pt reports taking them regularly without complication/adverse reaction being reported today.  Checking feet daily. Patient has been tried historically on every major statin.  He could not tolerate pravastatin even once weekly.  He says it caused his arms to become so sore and weak that he could not lift them above his shoulders.  He would like to resume Repatha.  He will need to get prior authorization for that.  We will send him to Alexander Pennington our pharmacist to pursue that. Patient takes multiple controlled substances:   Follow up for testosterone deficiency: Pt. Using medication as directed. Denies any sx referrable to DVT such as edema or erythema of legs. No dyspnea or chest pain. Energy level reported as  being good. Libido is normal and denies E.D. Feels strength is adequate and improved from baseline.  Patient takes hydrocodone at bedtime and tramadol through the day to control his diabetic neuropathy pain.  This is in addition to the maximum amount of gabapentin that he can tolerate.  He has a documented diabetic neuropathy in the feet.  Pain is adequately controlled by current regimen.  Hydrocodone, morphine milligram equivalent is 5.  Tramadol morphine milligram equivalent is 30.  An updated controlled substance contract was executed with him as part of today's evaluation  Patient uses lorazepam to help with sleep when he cannot turn off his mind.  Currently PDMP shows that he has not filled the bottle since last November.  He says he has plenty still of that.  There is no discrepancy in his filling prescriptions therefore he has been not been using it in an inappropriate way.  Lorazepam milligram equivalent is one.  History Alexander Pennington has a past medical history of CAD (coronary artery disease) (06/2014), Charcot foot due to diabetes mellitus ([place]), Complication of anesthesia, Diabetes mellitus ([place]), Diabetic Charcot foot ([place]), Family history of anesthesia complication, Hemorrhagic stroke ([place]), Hyperlipidemia, Hypertension, Kidney stones, Murmur, heart (Fall 2014 - diagnosed), Neuropathy, Obesity, and Sleep apnea.   He has a past surgical history that includes lap band surgery; Foot surgery; Carpal tunnel release (Left); I & D extremity (Bilateral, 06/17/2014); Vasectomy; Colonoscopy;  Amputation (Left, 07/06/2014); Hardware Removal (Left, 07/06/2014); left heart catheterization with coronary angiogram (N/A, 06/16/2014); Amputation (Left, 09/23/2014); I & D extremity (Right, 11/04/2014); NM MYOVIEW LTD (05/2014); transthoracic echocardiogram (05/2014); and transthoracic echocardiogram (02/05/2018).   His family history includes CAD (age of onset: 75) in his father; Cancer - Colon in his brother; Healthy in  his mother; Heart attack (age of onset: 25) in his father; Valvular heart disease (age of onset: 75) in his brother.He reports that he quit smoking about 31 years ago. His smoking use included cigarettes. He has never used smokeless tobacco. He reports current alcohol use. He reports that he does not use drugs.  Current Outpatient Medications on File Prior to Visit  Medication Sig Dispense Refill  . aspirin EC 81 MG tablet Take 1 tablet (81 mg total) by mouth daily. 90 tablet 3  . BD INSULIN SYRINGE U/F 31G X 5/16" 0.3 ML MISC USE TO INJECT NOVOLOG UNDER THE SKIN THREE TIMES A DAY AS DIRECTED BY YOUR DOCTOR 270 each 3  . Coenzyme Q10 200 MG capsule Take 1 capsule (200 mg total) by mouth daily.    . Folic Acid-Vit W0-JWJ X91 ([name]) 2.5-25-1 MG TABS tablet Take 1 tablet by mouth daily. 90 tablet 3  . Insulin Pen Needle (PEN NEEDLES) 30G X 8 MM MISC 1 Units by Does not apply route daily. 100 each 3  . LORazepam (ATIVAN) 1 MG tablet Take 1 tablet (1 mg total) by mouth at bedtime as needed. 90 tablet 1  . Multiple Vitamins tablet Take 1 tablet by mouth daily.    . tadalafil (CIALIS) 20 MG tablet Take 1 tablet (20 mg total) by mouth every other day as needed for erectile dysfunction. 24 tablet 3   No current facility-administered medications on file prior to visit.    ROS Review of Systems  Constitutional: Negative for activity change, fatigue and unexpected weight change.  HENT: Negative for congestion, ear pain, hearing loss, postnasal drip and trouble swallowing.   Eyes: Negative for pain and visual disturbance.  Respiratory: Negative for cough, chest tightness and shortness of breath.   Cardiovascular: Negative for chest pain, palpitations and leg swelling.  Gastrointestinal: Negative for abdominal distention, abdominal pain, blood in stool, constipation, diarrhea, nausea and vomiting.  Endocrine: Negative for cold intolerance, heat intolerance and polydipsia.  Genitourinary: Negative for  difficulty urinating, dysuria, flank pain, frequency and urgency.  Musculoskeletal: Negative for arthralgias and joint swelling.  Skin: Negative for color change, rash and wound.  Neurological: Negative for dizziness, syncope, speech difficulty, weakness, light-headedness, numbness and headaches.  Hematological: Does not bruise/bleed easily.  Psychiatric/Behavioral: Negative for confusion, decreased concentration and dysphoric mood.    Objective:  BP (!) 171/81   Pulse 78   Temp 98.6 F (37 C) (Temporal)   Resp 20   Ht '5\' 8"'  (1.727 m)   Wt 280 lb 2 oz (127.1 kg)   SpO2 97%   BMI 42.59 kg/m   BP Readings from Last 3 Encounters:  03/23/20 (!) 171/81  05/13/19 (!) 148/70  01/29/19 (!) 150/70    Wt Readings from Last 3 Encounters:  03/23/20 280 lb 2 oz (127.1 kg)  05/13/19 272 lb (123.4 kg)  01/29/19 265 lb (120.2 kg)     Physical Exam Constitutional:      Appearance: He is well-developed. He is obese.  HENT:     Head: Normocephalic and atraumatic.  Eyes:     Pupils: Pupils are equal, round, and reactive to light.  Neck:     Thyroid: No thyromegaly.     Trachea: No tracheal deviation.  Cardiovascular:     Rate and Rhythm: Normal rate and regular rhythm.     Heart sounds: Normal heart sounds. No murmur heard.  No friction rub. No gallop.   Pulmonary:     Breath sounds: Normal breath sounds. No wheezing or rales.  Abdominal:     General: Bowel sounds are normal. There is no distension.     Palpations: Abdomen is soft. There is no mass.     Tenderness: There is no abdominal tenderness.     Hernia: There is no hernia in the left inguinal area.  Genitourinary:    Penis: Normal.      Testes: Normal.  Musculoskeletal:        General: Normal range of motion.     Cervical back: Normal range of motion.  Lymphadenopathy:     Cervical: No cervical adenopathy.  Skin:    General: Skin is warm and dry.  Neurological:     Mental Status: He is alert and oriented to person,  place, and time.     Results for orders placed or performed in visit on 03/09/20  CBC with Differential/Platelet  Result Value Ref Range   WBC 6.0 3.4 - 10.8 x10E3/uL   RBC 5.65 4.14 - 5.80 x10E6/uL   Hemoglobin 18.0 (H) 13.0 - 17.7 g/dL   Hematocrit 51.5 (H) 37.5 - 51.0 %   MCV 91 79 - 97 fL   MCH 31.9 26.6 - 33.0 pg   MCHC 35.0 31 - 35 g/dL   RDW 12.7 11.6 - 15.4 %   Platelets 167 150 - 450 x10E3/uL   Neutrophils 60 Not Estab. %   Lymphs 26 Not Estab. %   Monocytes 9 Not Estab. %   Eos 4 Not Estab. %   Basos 1 Not Estab. %   Neutrophils Absolute 3.6 1 - 7 x10E3/uL   Lymphocytes Absolute 1.6 0 - 3 x10E3/uL   Monocytes Absolute 0.5 0 - 0 x10E3/uL   EOS (ABSOLUTE) 0.3 0.0 - 0.4 x10E3/uL   Basophils Absolute 0.1 0 - 0 x10E3/uL   Immature Granulocytes 0 Not Estab. %   Immature Grans (Abs) 0.0 0.0 - 0.1 x10E3/uL  CMP14+EGFR  Result Value Ref Range   Glucose 173 (H) 65 - 99 mg/dL   BUN 18 8 - 27 mg/dL   Creatinine, Ser 0.68 (L) 0.76 - 1.27 mg/dL   GFR calc non Af Amer 103 >59 mL/min/1.73   GFR calc Af Amer 119 >59 mL/min/1.73   BUN/Creatinine Ratio 26 (H) 10 - 24   Sodium 137 134 - 144 mmol/L   Potassium 4.9 3.5 - 5.2 mmol/L   Chloride 99 96 - 106 mmol/L   CO2 23 20 - 29 mmol/L   Calcium 9.4 8.6 - 10.2 mg/dL   Total Protein 6.9 6.0 - 8.5 g/dL   Albumin 4.4 3.8 - 4.8 g/dL   Globulin, Total 2.5 1.5 - 4.5 g/dL   Albumin/Globulin Ratio 1.8 1.2 - 2.2   Bilirubin Total 0.4 0.0 - 1.2 mg/dL   Alkaline Phosphatase 74 48 - 121 IU/L   AST 22 0 - 40 IU/L   ALT 20 0 - 44 IU/L  Lipid panel  Result Value Ref Range   Cholesterol, Total 195 100 - 199 mg/dL   Triglycerides 173 (H) 0 - 149 mg/dL   HDL 34 (L) >39 mg/dL   VLDL Cholesterol Cal 31 5 - 40  mg/dL   LDL Chol Calc (NIH) 130 (H) 0 - 99 mg/dL   Chol/HDL Ratio 5.7 (H) 0.0 - 5.0 ratio  PSA Total (Reflex To Free)  Result Value Ref Range   Prostate Specific Ag, Serum 0.5 0.0 - 4.0 ng/mL   Reflex Criteria Comment   VITAMIN D 25  Hydroxy (Vit-D Deficiency, Fractures)  Result Value Ref Range   Vit D, 25-Hydroxy 62.0 30.0 - 100.0 ng/mL  TSH  Result Value Ref Range   TSH 2.530 0.450 - 4.500 uIU/mL  Testosterone,Free and Total  Result Value Ref Range   Testosterone 437 264 - 916 ng/dL   Testosterone, Free WILL FOLLOW      Assessment & Plan:   Stiven was seen today for annual exam.  Diagnoses and all orders for this visit:  Controlled substance agreement signed -     ToxASSURE Select 13 (MW), Urine  Diabetic peripheral neuropathy associated with type 2 diabetes mellitus (HCC) -     Bayer DCA Hb A1c Waived -     canagliflozin (INVOKANA) 300 MG TABS tablet; Take 1 tablet (300 mg total) by mouth daily. -     gabapentin (NEURONTIN) 600 MG tablet; TAKE 2 TABLETS THREE TIMES A DAY -     HYDROcodone-acetaminophen (NORCO) 5-325 MG tablet; Take 1 tablet by mouth daily. -     HYDROcodone-acetaminophen (NORCO) 5-325 MG tablet; Take 1 tablet by mouth daily. Take for moderate to severe pain -     HYDROcodone-acetaminophen (NORCO) 5-325 MG tablet; Take 1 tablet by mouth daily. -     metFORMIN (GLUCOPHAGE-XR) 500 MG 24 hr tablet; Take 1 tablet (500 mg total) by mouth 2 (two) times daily with a meal. -     traMADol (ULTRAM) 50 MG tablet; Take 2 tablets (100 mg total) by mouth 3 (three) times daily.  Well adult exam  Testosterone deficiency -     Testosterone 20.25 MG/ACT (1.62%) GEL; APPLY 4 PUMPS ONTO THE SKIN DAILY  Uncomplicated opioid dependence ([place])  Coronary artery disease involving native coronary artery of native heart without angina pectoris  Essential hypertension  Hyperlipidemia associated with type 2 diabetes mellitus ([place])  Morbid obesity ([place])  Type 2 diabetes mellitus with complication, with long-term current use of insulin (HCC) -     metFORMIN (GLUCOPHAGE-XR) 500 MG 24 hr tablet; Take 1 tablet (500 mg total) by mouth 2 (two) times daily with a meal.  Diabetic nephropathy associated with type 2  diabetes mellitus (HCC) -     metFORMIN (GLUCOPHAGE-XR) 500 MG 24 hr tablet; Take 1 tablet (500 mg total) by mouth 2 (two) times daily with a meal.  Other orders -     celecoxib (CELEBREX) 200 MG capsule; Take 1 capsule (200 mg total) by mouth 2 (two) times daily. -     insulin glargine (LANTUS SOLOSTAR) 100 UNIT/ML Solostar Pen; Inject 40 Units into the skin daily. -     valsartan (DIOVAN) 160 MG tablet; Take 1 tablet (160 mg total) by mouth daily. -     Evolocumab (REPATHA SURECLICK) 599 MG/ML SOAJ; Inject 140 mg into the skin every 14 (fourteen) days.    We discussed in detail his need to avoid filling prescriptions for opiates from other sources.  It was legitimate for him to get oxycodone after his hip surgery.  However he is not allowed to take that without prior authorization from this office.  Patient has an A1c pending.  I am a bit skeptical of his decrease in Metformin  and actually improving his sugar.  Since his home readings declined by his report, I will hold off on any treatment changes based on his A1c.  We discussed weight loss is being the key to his multiple diagnoses.  He is in agreement and is making effort to cut back on calories routinely.  He also will increase exercise.  He plans to join an aquarobics course.  I have changed Mack Guise. [place]'s Lantus SoloStar and metFORMIN. I am also having him maintain his Multiple Vitamins, Coenzyme Q10, aspirin EC, Pen Needles, LORazepam, BD Insulin Syringe U/F, Folic Acid-Vit N4-BSJ G28, tadalafil, canagliflozin, celecoxib, gabapentin, HYDROcodone-acetaminophen, HYDROcodone-acetaminophen, HYDROcodone-acetaminophen, Testosterone, traMADol, valsartan, and Repatha SureClick.  Meds ordered this encounter  Medications  . canagliflozin (INVOKANA) 300 MG TABS tablet    Sig: Take 1 tablet (300 mg total) by mouth daily.    Dispense:  90 tablet    Refill:  0  . celecoxib (CELEBREX) 200 MG capsule    Sig: Take 1 capsule (200 mg total) by mouth  2 (two) times daily.    Dispense:  180 capsule    Refill:  0  . gabapentin (NEURONTIN) 600 MG tablet    Sig: TAKE 2 TABLETS THREE TIMES A DAY    Dispense:  540 tablet    Refill:  3  . HYDROcodone-acetaminophen (NORCO) 5-325 MG tablet    Sig: Take 1 tablet by mouth daily.    Dispense:  30 tablet    Refill:  0  . HYDROcodone-acetaminophen (NORCO) 5-325 MG tablet    Sig: Take 1 tablet by mouth daily. Take for moderate to severe pain    Dispense:  30 tablet    Refill:  0  . HYDROcodone-acetaminophen (NORCO) 5-325 MG tablet    Sig: Take 1 tablet by mouth daily.    Dispense:  30 tablet    Refill:  0  . insulin glargine (LANTUS SOLOSTAR) 100 UNIT/ML Solostar Pen    Sig: Inject 40 Units into the skin daily.    Dispense:  45 mL    Refill:  3  . metFORMIN (GLUCOPHAGE-XR) 500 MG 24 hr tablet    Sig: Take 1 tablet (500 mg total) by mouth 2 (two) times daily with a meal.    Dispense:  180 tablet    Refill:  1  . Testosterone 20.25 MG/ACT (1.62%) GEL    Sig: APPLY 4 PUMPS ONTO THE SKIN DAILY    Dispense:  450 g    Refill:  1  . traMADol (ULTRAM) 50 MG tablet    Sig: Take 2 tablets (100 mg total) by mouth 3 (three) times daily.    Dispense:  540 tablet    Refill:  0  . valsartan (DIOVAN) 160 MG tablet    Sig: Take 1 tablet (160 mg total) by mouth daily.    Dispense:  30 tablet    Refill:  11  . Evolocumab (REPATHA SURECLICK) 366 MG/ML SOAJ    Sig: Inject 140 mg into the skin every 14 (fourteen) days.    Dispense:  2 pen    Refill:  11   We discussed weight loss has been key to controlling his diabetes and blood pressure as well as relieving his pain.  He is committed to weight loss.  We will monitor that with each visit.  Follow-up: Return in about 3 months (around 06/23/2020).  Claretta Fraise, M.D.

## 2020-03-23 NOTE — Progress Notes (Signed)
PHARMACY NOTIFIED.

## 2020-03-23 NOTE — Progress Notes (Unsigned)
SENT TO PLAN  KEY: [id number]  RX # [id number]   TESTOSTERONE 20.25MG /ACT GEL

## 2020-03-25 LAB — TESTOSTERONE,FREE AND TOTAL
Testosterone, Free: 12.7 pg/mL (ref 6.6–18.1)
Testosterone: 437 ng/dL (ref 264–916)

## 2020-03-27 ENCOUNTER — Encounter: Payer: Self-pay | Admitting: Family Medicine

## 2020-03-27 ENCOUNTER — Telehealth: Payer: Self-pay | Admitting: *Deleted

## 2020-03-27 NOTE — Telephone Encounter (Signed)
That's great! We got word it wasn't covered!

## 2020-03-27 NOTE — Telephone Encounter (Signed)
See if pt. Is willing to take Jardiance along with his other DM meds. I recommend it. If he agrees, let me know.

## 2020-03-27 NOTE — Telephone Encounter (Signed)
Patient states that he just picked up a 90 day supply of invokana at the pharmacy yesterday.

## 2020-03-27 NOTE — Telephone Encounter (Signed)
PA came in today for pt - INVOKANA 300mg  Tab - Is NOT COVERED by pt insurance Key:  Covered alternatives include (without need for PA) include: Metformin HCL Pioglitazone Hcl Lantus Solostar Rybelsus   Covered with PA include: Jardiance Farxiga Steglatro Januvia  Please advise on what you would like to change pt to.

## 2020-03-28 LAB — TOXASSURE SELECT 13 (MW), URINE

## 2020-03-29 ENCOUNTER — Other Ambulatory Visit: Payer: Self-pay | Admitting: Family Medicine

## 2020-03-29 MED ORDER — SILDENAFIL CITRATE 20 MG PO TABS: ORAL_TABLET | ORAL | 5 refills | Status: DC

## 2020-06-17 ENCOUNTER — Other Ambulatory Visit: Payer: Self-pay | Admitting: Family Medicine

## 2020-06-17 DIAGNOSIS — E1142 Type 2 diabetes mellitus with diabetic polyneuropathy: Secondary | ICD-10-CM

## 2020-06-29 ENCOUNTER — Other Ambulatory Visit: Payer: Self-pay

## 2020-06-29 ENCOUNTER — Ambulatory Visit (INDEPENDENT_AMBULATORY_CARE_PROVIDER_SITE_OTHER): Payer: BC Managed Care – PPO | Admitting: Family Medicine

## 2020-06-29 VITALS — BP 151/81 | HR 71 | Temp 98.4°F | Ht 68.0 in | Wt 275.2 lb

## 2020-06-29 DIAGNOSIS — I10 Essential (primary) hypertension: Secondary | ICD-10-CM

## 2020-06-29 DIAGNOSIS — Z794 Long term (current) use of insulin: Secondary | ICD-10-CM

## 2020-06-29 DIAGNOSIS — E559 Vitamin D deficiency, unspecified: Secondary | ICD-10-CM

## 2020-06-29 DIAGNOSIS — E118 Type 2 diabetes mellitus with unspecified complications: Secondary | ICD-10-CM

## 2020-06-29 DIAGNOSIS — E1142 Type 2 diabetes mellitus with diabetic polyneuropathy: Secondary | ICD-10-CM

## 2020-06-29 DIAGNOSIS — E1169 Type 2 diabetes mellitus with other specified complication: Secondary | ICD-10-CM

## 2020-06-29 DIAGNOSIS — E349 Endocrine disorder, unspecified: Secondary | ICD-10-CM

## 2020-06-29 DIAGNOSIS — Z79899 Other long term (current) drug therapy: Secondary | ICD-10-CM

## 2020-06-29 DIAGNOSIS — E785 Hyperlipidemia, unspecified: Secondary | ICD-10-CM

## 2020-06-29 LAB — BAYER DCA HB A1C WAIVED: HB A1C (BAYER DCA - WAIVED): 7.3 % — ABNORMAL HIGH (ref <7.0)

## 2020-06-29 MED ORDER — CANAGLIFLOZIN 300 MG PO TABS: 300.0000 mg | ORAL_TABLET | Freq: Every day | ORAL | 1 refills | Status: DC

## 2020-06-29 MED ORDER — VALSARTAN 160 MG PO TABS
160.0000 mg | ORAL_TABLET | Freq: Every day | ORAL | 3 refills | Status: DC
Start: 2020-06-29 — End: 2020-06-30

## 2020-06-29 MED ORDER — TESTOSTERONE 20.25 MG/ACT (1.62%) TD GEL: TRANSDERMAL | 1 refills | Status: DC

## 2020-06-29 MED ORDER — CELECOXIB 200 MG PO CAPS
200.0000 mg | ORAL_CAPSULE | Freq: Two times a day (BID) | ORAL | 1 refills | Status: DC
Start: 2020-06-29 — End: 2020-06-30

## 2020-06-29 MED ORDER — TRAMADOL HCL 50 MG PO TABS: 100.0000 mg | ORAL_TABLET | Freq: Three times a day (TID) | ORAL | 0 refills | Status: DC

## 2020-06-30 ENCOUNTER — Other Ambulatory Visit: Payer: Self-pay

## 2020-06-30 DIAGNOSIS — E349 Endocrine disorder, unspecified: Secondary | ICD-10-CM

## 2020-06-30 DIAGNOSIS — E1142 Type 2 diabetes mellitus with diabetic polyneuropathy: Secondary | ICD-10-CM

## 2020-06-30 LAB — CMP14+EGFR
ALT: 22 IU/L (ref 0–44)
AST: 17 IU/L (ref 0–40)
Albumin/Globulin Ratio: 1.7 (ref 1.2–2.2)
Albumin: 4.3 g/dL (ref 3.8–4.8)
Alkaline Phosphatase: 75 IU/L (ref 44–121)
BUN/Creatinine Ratio: 28 — ABNORMAL HIGH (ref 10–24)
BUN: 20 mg/dL (ref 8–27)
Bilirubin Total: 0.4 mg/dL (ref 0.0–1.2)
CO2: 21 mmol/L (ref 20–29)
Calcium: 9.5 mg/dL (ref 8.6–10.2)
Chloride: 98 mmol/L (ref 96–106)
Creatinine, Ser: 0.71 mg/dL — ABNORMAL LOW (ref 0.76–1.27)
GFR calc Af Amer: 117 mL/min/{1.73_m2} (ref >59)
GFR calc non Af Amer: 101 mL/min/{1.73_m2} (ref >59)
Globulin, Total: 2.6 g/dL (ref 1.5–4.5)
Glucose: 213 mg/dL — ABNORMAL HIGH (ref 65–99)
Potassium: 4.9 mmol/L (ref 3.5–5.2)
Sodium: 135 mmol/L (ref 134–144)
Total Protein: 6.9 g/dL (ref 6.0–8.5)

## 2020-06-30 LAB — LIPID PANEL
Chol/HDL Ratio: 6.5 ratio — ABNORMAL HIGH (ref 0.0–5.0)
Cholesterol, Total: 201 mg/dL — ABNORMAL HIGH (ref 100–199)
HDL: 31 mg/dL — ABNORMAL LOW (ref >39)
LDL Chol Calc (NIH): 134 mg/dL — ABNORMAL HIGH (ref 0–99)
Triglycerides: 201 mg/dL — ABNORMAL HIGH (ref 0–149)
VLDL Cholesterol Cal: 36 mg/dL (ref 5–40)

## 2020-06-30 LAB — CBC WITH DIFFERENTIAL/PLATELET
Basophils Absolute: 0.1 10*3/uL (ref 0.0–0.2)
Basos: 1 %
EOS (ABSOLUTE): 0.1 10*3/uL (ref 0.0–0.4)
Eos: 2 %
Hematocrit: 56.4 % — ABNORMAL HIGH (ref 37.5–51.0)
Hemoglobin: 19.9 g/dL — ABNORMAL HIGH (ref 13.0–17.7)
Immature Grans (Abs): 0 10*3/uL (ref 0.0–0.1)
Immature Granulocytes: 0 %
Lymphocytes Absolute: 1.4 10*3/uL (ref 0.7–3.1)
Lymphs: 20 %
MCH: 31.7 pg (ref 26.6–33.0)
MCHC: 35.3 g/dL (ref 31.5–35.7)
MCV: 90 fL (ref 79–97)
Monocytes Absolute: 0.6 10*3/uL (ref 0.1–0.9)
Monocytes: 8 %
Neutrophils Absolute: 4.8 10*3/uL (ref 1.4–7.0)
Neutrophils: 69 %
Platelets: 158 10*3/uL (ref 150–450)
RBC: 6.27 x10E6/uL — ABNORMAL HIGH (ref 4.14–5.80)
RDW: 12 % (ref 11.6–15.4)
WBC: 6.9 10*3/uL (ref 3.4–10.8)

## 2020-06-30 LAB — VITAMIN D 25 HYDROXY (VIT D DEFICIENCY, FRACTURES): Vit D, 25-Hydroxy: 56.6 ng/mL (ref 30.0–100.0)

## 2020-06-30 MED ORDER — CANAGLIFLOZIN 300 MG PO TABS: 300.0000 mg | ORAL_TABLET | Freq: Every day | ORAL | 1 refills | Status: DC

## 2020-06-30 MED ORDER — TESTOSTERONE 20.25 MG/ACT (1.62%) TD GEL: TRANSDERMAL | 1 refills | Status: DC

## 2020-06-30 MED ORDER — TRAMADOL HCL 50 MG PO TABS: 100.0000 mg | ORAL_TABLET | Freq: Three times a day (TID) | ORAL | 0 refills | Status: DC

## 2020-06-30 MED ORDER — VALSARTAN 160 MG PO TABS: 160.0000 mg | ORAL_TABLET | Freq: Every day | ORAL | 3 refills | Status: DC

## 2020-06-30 MED ORDER — CELECOXIB 200 MG PO CAPS
200.0000 mg | ORAL_CAPSULE | Freq: Two times a day (BID) | ORAL | 1 refills | Status: DC
Start: 2020-06-30 — End: 2020-07-03

## 2020-06-30 NOTE — Telephone Encounter (Signed)
Pt aware refills sent to pharmacy, Dr Darlyn Read will send Tramadol & Testosterone.

## 2020-06-30 NOTE — Telephone Encounter (Signed)
Pt called stating that Dr Darlyn Read was supposed to send all of his medicine to [facility] [place] but [facility] and says he told the nurse and Dr Darlyn Read that yesterday.  [facility] [place] [facility] and wants to be called when meds have been sent.

## 2020-07-02 ENCOUNTER — Encounter: Payer: Self-pay | Admitting: Family Medicine

## 2020-07-02 NOTE — Progress Notes (Signed)
Subjective:  Patient ID: Alexander Pennington,  male    DOB: 09/26/1960  Age: 59 y.o.    CC: Follow-up (3 month)   HPI ADEEL GUIFFRE presents for  follow-up of hypertension. Patient has no history of headache chest pain or shortness of breath or recent cough. Patient also denies symptoms of TIA such as numbness weakness lateralizing. Patient denies side effects from medication. States taking it regularly.  Patient also  in for follow-up of elevated cholesterol. Doing well without complaints on current medication. Denies side effects  including myalgia and arthralgia and nausea. Also in today for liver function testing. Currently no chest pain, shortness of breath or other cardiovascular related symptoms noted.  Follow-up of diabetes. Patient does check blood sugar at home. Readings run between 130 and 160 Patient denies symptoms such as excessive hunger or urinary frequency, excessive hunger, nausea No significant hypoglycemic spells noted. Medications reviewed. Pt reports taking them regularly. Pt. denies complication/adverse reaction today.   Patient also in for follow-up of chronic pain when he has decreased his hydrocodone use to 3 tablets daily.  He is trying to make do with just the tramadol.  He says he has been out for the last couple of weeks though.  Of note is that his pain is 5-6/10.  Patient tells me he retired from his work on September 10 of this year.   History Rudi has a past medical history of CAD (coronary artery disease) (06/2014), Charcot foot due to diabetes mellitus ([place]), Complication of anesthesia, Diabetes mellitus ([place]), Diabetic Charcot foot ([place]), Family history of anesthesia complication, Hemorrhagic stroke ([place]), Hyperlipidemia, Hypertension, Kidney stones, Murmur, heart (Fall 2014 - diagnosed), Neuropathy, Obesity, and Sleep apnea.   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   His family history includes CAD (age of onset: 75) in his father; Cancer - Colon in his brother; Healthy in his mother; Heart attack (age of onset: 56) in his father; Valvular heart disease (age of onset: 72) in his brother.He reports that he quit smoking about 32 years ago. His smoking use included cigarettes. He has never used smokeless tobacco. He reports current alcohol use. He reports that he does not use drugs.  Current Outpatient Medications on File Prior to Visit  Medication Sig Dispense Refill  . aspirin EC 81 MG tablet Take 1 tablet (81 mg total) by mouth daily. 90 tablet 3  . BD INSULIN SYRINGE U/F 31G X 5/16" 0.3 ML MISC USE TO INJECT NOVOLOG UNDER THE SKIN THREE TIMES A DAY AS DIRECTED BY YOUR DOCTOR 270 each 3  . Coenzyme Q10 200 MG capsule Take 1 capsule (200 mg total) by mouth daily.    . Folic Acid-Vit K5-LZJ Q73 ([name]) 2.5-25-1 MG TABS tablet Take 1 tablet by mouth daily. 90 tablet 3  . gabapentin (NEURONTIN) 600 MG tablet TAKE 2 TABLETS THREE TIMES A DAY 540 tablet 3  . insulin glargine (LANTUS SOLOSTAR) 100 UNIT/ML Solostar Pen Inject 40 Units into the skin daily. 45 mL 3  . Insulin Pen Needle (PEN NEEDLES) 30G X 8 MM MISC 1 Units by Does not apply route daily. 100 each 3  . LORazepam (ATIVAN) 1 MG  tablet Take 1 tablet (1 mg total) by mouth at bedtime as needed. 90 tablet 1  . metFORMIN (GLUCOPHAGE-XR) 500 MG 24 hr tablet Take 1 tablet (500 mg total) by mouth 2 (two) times daily with a meal. 180 tablet 1  . Multiple Vitamins tablet Take 1 tablet by mouth daily.    . sildenafil (REVATIO) 20 MG tablet Take 2-5 pills at  once, orally, with each sexual encounter 50 tablet 5  . Evolocumab (REPATHA SURECLICK) 767 MG/ML SOAJ Inject 140 mg into the skin every 14 (fourteen) days. (Patient not taking: Reported on 06/29/2020) 2 pen 11   No current facility-administered medications on file prior to visit.    ROS Review of Systems  Constitutional: Negative for fever.  Respiratory: Negative for shortness of breath.   Cardiovascular: Negative for chest pain.  Musculoskeletal: Positive for arthralgias and myalgias.  Skin: Negative for rash.  Neurological: Positive for numbness.    Objective:  BP (!) 151/81   Pulse 71   Temp 98.4 F (36.9 C) (Temporal)   Ht '5\' 8"'  (1.727 m)   Wt 275 lb 3.2 oz (124.8 kg)   BMI 41.84 kg/m   BP Readings from Last 3 Encounters:  06/29/20 (!) 151/81  03/23/20 (!) 171/81  05/13/19 (!) 148/70    Wt Readings from Last 3 Encounters:  06/29/20 275 lb 3.2 oz (124.8 kg)  03/23/20 280 lb 2 oz (127.1 kg)  05/13/19 272 lb (123.4 kg)     Physical Exam Vitals reviewed.  Constitutional:      Appearance: He is well-developed.  HENT:     Head: Normocephalic and atraumatic.     Right Ear: Tympanic membrane and external ear normal. No decreased hearing noted.     Left Ear: Tympanic membrane and external ear normal. No decreased hearing noted.     Mouth/Throat:     Pharynx: No oropharyngeal exudate or posterior oropharyngeal erythema.  Eyes:     Pupils: Pupils are equal, round, and reactive to light.  Cardiovascular:     Rate and Rhythm: Normal rate and regular rhythm.     Heart sounds: No murmur heard.   Pulmonary:     Effort: No respiratory distress.     Breath sounds: Normal breath sounds.  Abdominal:     General: Bowel sounds are normal.     Palpations: Abdomen is soft. There is no mass.     Tenderness: There is no abdominal tenderness.  Musculoskeletal:     Cervical back: Normal range of motion and neck supple.     Diabetic Foot Exam - Simple   No data filed         Assessment & Plan:   Elyas was seen today for follow-up.  Diagnoses and all orders for this visit:  Controlled substance agreement signed -     CBC with Differential/Platelet -     CMP14+EGFR  Diabetic peripheral neuropathy associated with type 2 diabetes mellitus ([place]) -     CBC with Differential/Platelet -     CMP14+EGFR -     Discontinue: canagliflozin (INVOKANA) 300 MG TABS tablet; Take 1 tablet (300 mg total) by mouth daily. -     Discontinue: traMADol (ULTRAM) 50 MG tablet; Take 2 tablets (100 mg total) by mouth 3 (three) times daily.  Testosterone deficiency -     CBC with Differential/Platelet -     CMP14+EGFR -     Discontinue: Testosterone 20.25 MG/ACT (1.62%) GEL; APPLY 4 PUMPS ONTO THE SKIN DAILY  Essential  hypertension -     CBC with Differential/Platelet -     CMP14+EGFR  Hyperlipidemia associated with type 2 diabetes mellitus (HCC) -     CBC with Differential/Platelet -     CMP14+EGFR -     Lipid panel  Type 2 diabetes mellitus with complication, with long-term current use of insulin (HCC) -     CBC with Differential/Platelet -     CMP14+EGFR -     Bayer DCA Hb A1c Waived  Vitamin D deficiency -     CBC with Differential/Platelet -     CMP14+EGFR -     VITAMIN D 25 Hydroxy (Vit-D Deficiency, Fractures)  Other orders -     Discontinue: celecoxib (CELEBREX) 200 MG capsule; Take 1 capsule (200 mg total) by mouth 2 (two) times daily. -     Discontinue: valsartan (DIOVAN) 160 MG tablet; Take 1 tablet (160 mg total) by mouth daily.   I have discontinued Mack Guise. Glaspy's HYDROcodone-acetaminophen, HYDROcodone-acetaminophen, HYDROcodone-acetaminophen, Testosterone, traMADol, valsartan, Invokana, and celecoxib. I am also having him maintain his Multiple Vitamins, Coenzyme Q10, aspirin EC, Pen Needles, LORazepam, BD Insulin Syringe U/F, Folic Acid-Vit Q0-HKV Q25, gabapentin, Lantus SoloStar, metFORMIN, Repatha SureClick, and sildenafil.  Meds ordered this  encounter  Medications  . DISCONTD: Testosterone 20.25 MG/ACT (1.62%) GEL    Sig: APPLY 4 PUMPS ONTO THE SKIN DAILY    Dispense:  450 g    Refill:  1  . DISCONTD: celecoxib (CELEBREX) 200 MG capsule    Sig: Take 1 capsule (200 mg total) by mouth 2 (two) times daily.    Dispense:  180 capsule    Refill:  1  . DISCONTD: canagliflozin (INVOKANA) 300 MG TABS tablet    Sig: Take 1 tablet (300 mg total) by mouth daily.    Dispense:  90 tablet    Refill:  1  . DISCONTD: traMADol (ULTRAM) 50 MG tablet    Sig: Take 2 tablets (100 mg total) by mouth 3 (three) times daily.    Dispense:  540 tablet    Refill:  0  . DISCONTD: valsartan (DIOVAN) 160 MG tablet    Sig: Take 1 tablet (160 mg total) by mouth daily.    Dispense:  90 tablet    Refill:  3    Brand medically necessary     Follow-up: Return in about 3 months (around 09/29/2020).  Claretta Fraise, M.D.

## 2020-07-03 ENCOUNTER — Other Ambulatory Visit: Payer: Self-pay | Admitting: Family Medicine

## 2020-07-03 ENCOUNTER — Telehealth: Payer: Self-pay | Admitting: *Deleted

## 2020-07-03 ENCOUNTER — Encounter: Payer: Self-pay | Admitting: Family Medicine

## 2020-07-03 MED ORDER — CELECOXIB 200 MG PO CAPS
200.0000 mg | ORAL_CAPSULE | Freq: Two times a day (BID) | ORAL | 1 refills | Status: DC
Start: 2020-07-03 — End: 2021-05-16

## 2020-07-03 MED ORDER — SILDENAFIL CITRATE 20 MG PO TABS
ORAL_TABLET | ORAL | 5 refills | Status: DC
Start: 2020-07-03 — End: 2021-05-16

## 2020-07-03 MED ORDER — FOLIC ACID-VIT B6-VIT B12 2.5-25-1 MG PO TABS
1.0000 | ORAL_TABLET | Freq: Every day | ORAL | 3 refills | Status: AC
Start: 2020-07-03 — End: ?

## 2020-07-03 MED ORDER — TRAMADOL HCL 50 MG PO TABS: 100.0000 mg | ORAL_TABLET | Freq: Three times a day (TID) | ORAL | 0 refills | Status: DC

## 2020-07-03 MED ORDER — TESTOSTERONE 20.25 MG/ACT (1.62%) TD GEL: TRANSDERMAL | 1 refills | Status: DC

## 2020-07-03 MED ORDER — CANAGLIFLOZIN 300 MG PO TABS: 300.0000 mg | ORAL_TABLET | Freq: Every day | ORAL | 1 refills | Status: DC

## 2020-07-03 MED ORDER — VALSARTAN 160 MG PO TABS
160.0000 mg | ORAL_TABLET | Freq: Every day | ORAL | 3 refills | Status: DC
Start: 2020-07-03 — End: 2021-05-16

## 2020-07-03 NOTE — Addendum Note (Signed)
Addended by: Julious Payer D on: 07/03/2020 10:07 AM   Modules accepted: Orders

## 2020-07-03 NOTE — Telephone Encounter (Addendum)
E-prescribe down. Resent Dr. Darlyn Read Please resend Tramadol & Testosterone too

## 2020-07-03 NOTE — Telephone Encounter (Signed)
Key: [id number] - PA Case ID: [id number] - Rx #: [id number]  Drug Testosterone 20.25 MG/ACT(1.62%) gel Form [employer] Electronic PA Form [phone] NCPDP) Original Claim Info 75 PRIOR AUTH REQ-MD CALL [phone].DRUG REQUIRES PRIOR AUTHORIZATION  Sent to plan

## 2020-07-04 ENCOUNTER — Telehealth: Payer: Self-pay | Admitting: *Deleted

## 2020-07-04 ENCOUNTER — Telehealth: Payer: Self-pay | Admitting: Pharmacist

## 2020-07-04 NOTE — Telephone Encounter (Signed)
[facility]  received a request from your provider for coverage of Testosterone 20.25 MG/ACT(1.62%) TD GEL. As long as you remain covered by the [facility] and there are no changes to your plan benefits, this request is approved for the following time period: 07/04/2020 - 07/05/2023  Pharmacy and patient aware.

## 2020-07-04 NOTE — Telephone Encounter (Signed)
KeyRocco Serene - PA Case ID: [id number] Need help? Call us at [phone] Status Sent to [facility] traMADol HCl 50MG  tablets Form [employer] Electronic PA Form [phone] NCPDP)  Your information has been submitted to [employer]. To check for an updated outcome later, reopen this PA request from your dashboard.  If [employer] has not responded to your request within 24 hours, contact [employer] at [phone]. If you think there may be a problem with your PA request, use our live chat feature at the bottom right.

## 2020-07-04 NOTE — Telephone Encounter (Signed)
Patient aware and verbalizes understanding. 

## 2020-07-04 NOTE — Telephone Encounter (Signed)
lmtcb

## 2020-07-04 NOTE — Telephone Encounter (Signed)
Patient sees lipid clinic in [place] (last note in 5.2021).  They encouraged patient to start Repatha for lipids.  Recommend Nexletol 180mg  daily to be called in if patient does not want to come in.  He can call insurance to check on price.  This will also require a PA.

## 2020-07-04 NOTE — Telephone Encounter (Signed)
KeyMargaretmary Pennington - PA Case ID: [id number]   Your information has been submitted to [employer]. To check for an updated outcome later, reopen this PA request from your dashboard.  If [employer] has not responded to your request within 24 hours, contact [employer] at [phone]. If you think there may be a problem with your PA request, use our live chat feature at the bottom right.

## 2020-07-05 MED ORDER — LANTUS SOLOSTAR 100 UNIT/ML ~~LOC~~ SOPN: 40.0000 [IU] | PEN_INJECTOR | Freq: Every day | SUBCUTANEOUS | 3 refills | Status: DC

## 2020-07-05 NOTE — Telephone Encounter (Signed)
Approved today Your PA request has been approved. Additional information will be provided in the approval communication. (Message [phone])  [facility] and aware

## 2020-07-05 NOTE — Telephone Encounter (Signed)
[facility]  received a request from your provider for coverage of traMADol HCl 50MG  OR TABS. As long as you remain covered by the [facility] and there are no changes to your plan benefits, this request is approved for the following time period: 07/04/2020 - 01/02/2021  Pharmacy and patient aware.

## 2020-07-06 NOTE — Telephone Encounter (Signed)
Spoke with pharmacy and patient picked up rx yesterday.

## 2020-07-10 ENCOUNTER — Telehealth: Payer: Self-pay

## 2020-07-10 DIAGNOSIS — E1142 Type 2 diabetes mellitus with diabetic polyneuropathy: Secondary | ICD-10-CM

## 2020-07-10 MED ORDER — TRAMADOL HCL 50 MG PO TABS: 100.0000 mg | ORAL_TABLET | Freq: Three times a day (TID) | ORAL | 0 refills | Status: DC

## 2020-07-10 NOTE — Telephone Encounter (Signed)
Patient aware.

## 2020-07-10 NOTE — Telephone Encounter (Signed)
Sent refill for patient 

## 2020-07-10 NOTE — Telephone Encounter (Signed)
Patient given 7 day supply of Tramadol on Tuesday. Patient had to have a PA and it has been approved. Patient needs the others resent into pharmacy.

## 2020-07-17 ENCOUNTER — Other Ambulatory Visit: Payer: Self-pay | Admitting: Family Medicine

## 2020-07-21 ENCOUNTER — Other Ambulatory Visit: Payer: Self-pay | Admitting: Family Medicine

## 2020-07-21 DIAGNOSIS — E1142 Type 2 diabetes mellitus with diabetic polyneuropathy: Secondary | ICD-10-CM

## 2020-09-28 ENCOUNTER — Other Ambulatory Visit: Payer: Self-pay | Admitting: Family Medicine

## 2020-09-28 DIAGNOSIS — E1142 Type 2 diabetes mellitus with diabetic polyneuropathy: Secondary | ICD-10-CM

## 2020-09-28 DIAGNOSIS — E1121 Type 2 diabetes mellitus with diabetic nephropathy: Secondary | ICD-10-CM

## 2020-09-28 DIAGNOSIS — E118 Type 2 diabetes mellitus with unspecified complications: Secondary | ICD-10-CM

## 2020-09-28 DIAGNOSIS — Z794 Long term (current) use of insulin: Secondary | ICD-10-CM

## 2020-10-03 ENCOUNTER — Ambulatory Visit: Payer: BC Managed Care – PPO | Admitting: Family Medicine

## 2020-10-12 ENCOUNTER — Telehealth: Payer: Self-pay | Admitting: *Deleted

## 2020-10-12 NOTE — Telephone Encounter (Signed)
Fax from [facility] Testosterone 1.62% gel pump Request alternative: drug is not on formulary

## 2020-10-12 NOTE — Telephone Encounter (Signed)
Line busy, tried to contact patient to arrange 3 month follow up.

## 2020-10-12 NOTE — Telephone Encounter (Signed)
I'll address that with him at his next checkup.

## 2020-10-27 ENCOUNTER — Telehealth: Payer: Self-pay

## 2020-10-27 NOTE — Telephone Encounter (Signed)
Let him know I will miss him, but I can not fill scrips for him without a visit

## 2020-10-27 NOTE — Telephone Encounter (Signed)
Left detailed message stating that prescriptions could no longer be filled at this office due to change of offices per patient.  Patient will need to contact pharmacy and inform of this change.  Dr. Darlyn Read removed as PCP

## 2020-10-27 NOTE — Telephone Encounter (Signed)
Pt returned missed call stating that he had to switch practices because [facility] is not in network with his new insurance.

## 2020-12-08 ENCOUNTER — Telehealth: Payer: Self-pay | Admitting: *Deleted

## 2020-12-08 NOTE — Telephone Encounter (Signed)
We received prior authorization on celebrex. PA not completed as patient is no longer coming to our practice.

## 2020-12-25 ENCOUNTER — Other Ambulatory Visit: Payer: Self-pay

## 2021-05-16 ENCOUNTER — Other Ambulatory Visit: Payer: Self-pay

## 2021-05-16 ENCOUNTER — Encounter: Payer: Self-pay | Admitting: Family Medicine

## 2021-05-16 ENCOUNTER — Ambulatory Visit (INDEPENDENT_AMBULATORY_CARE_PROVIDER_SITE_OTHER): Payer: BC Managed Care – PPO | Admitting: Family Medicine

## 2021-05-16 VITALS — BP 173/86 | HR 93 | Temp 98.0°F | Resp 20 | Ht 68.0 in | Wt 269.0 lb

## 2021-05-16 DIAGNOSIS — E1169 Type 2 diabetes mellitus with other specified complication: Secondary | ICD-10-CM

## 2021-05-16 DIAGNOSIS — E785 Hyperlipidemia, unspecified: Secondary | ICD-10-CM

## 2021-05-16 DIAGNOSIS — Z0001 Encounter for general adult medical examination with abnormal findings: Secondary | ICD-10-CM

## 2021-05-16 DIAGNOSIS — E1121 Type 2 diabetes mellitus with diabetic nephropathy: Secondary | ICD-10-CM | POA: Diagnosis not present

## 2021-05-16 DIAGNOSIS — Z0000 Encounter for general adult medical examination without abnormal findings: Secondary | ICD-10-CM

## 2021-05-16 DIAGNOSIS — Z125 Encounter for screening for malignant neoplasm of prostate: Secondary | ICD-10-CM

## 2021-05-16 DIAGNOSIS — E1142 Type 2 diabetes mellitus with diabetic polyneuropathy: Secondary | ICD-10-CM | POA: Diagnosis not present

## 2021-05-16 DIAGNOSIS — E349 Endocrine disorder, unspecified: Secondary | ICD-10-CM

## 2021-05-16 DIAGNOSIS — I10 Essential (primary) hypertension: Secondary | ICD-10-CM | POA: Diagnosis not present

## 2021-05-16 MED ORDER — TESTOSTERONE 20.25 MG/ACT (1.62%) TD GEL: TRANSDERMAL | 0 refills | Status: AC

## 2021-05-16 MED ORDER — METFORMIN HCL ER 500 MG PO TB24
500.0000 mg | ORAL_TABLET | Freq: Every day | ORAL | 3 refills | Status: AC
Start: 2021-05-16 — End: ?

## 2021-05-16 MED ORDER — TADALAFIL 20 MG PO TABS: 20.0000 mg | ORAL_TABLET | ORAL | 1 refills | Status: AC | PRN

## 2021-05-16 MED ORDER — CELECOXIB 200 MG PO CAPS: 200.0000 mg | ORAL_CAPSULE | Freq: Two times a day (BID) | ORAL | 1 refills | Status: AC

## 2021-05-16 MED ORDER — GABAPENTIN 600 MG PO TABS: ORAL_TABLET | ORAL | 3 refills | Status: AC

## 2021-05-16 NOTE — Progress Notes (Signed)
Subjective:  Patient ID: Alexander Pennington, male    DOB: Feb 04, 1961  Age: 60 y.o. MRN: [id number]  CC: Annual Exam   HPI Alexander Pennington presents for for annual physical exam.  Follow-up of diabetes. Patient checks blood sugar at home.   156-190 fasting and not checking postprandial Patient denies symptoms such as polyuria, polydipsia, excessive hunger, nausea No significant hypoglycemic spells noted. Alexander Pennington dced the insulin and another provider switched him to jardiance. Medications reviewed.Used to crash in the night until Alexander Pennington stopped evening metformin.   Pt. Moved to another practice that prescribed his tramadol. She would not prescribe the testosterone. Pt. Is in today for me to write for that.   History Alexander Pennington has a past medical history of CAD (coronary artery disease) (06/2014), Charcot foot due to diabetes mellitus ([place]), Complication of anesthesia, Diabetes mellitus ([place]), Diabetic Charcot foot ([place]), Family history of anesthesia complication, Hemorrhagic stroke ([place]), Hyperlipidemia, Hypertension, Kidney stones, Murmur, heart (Fall 2014 - diagnosed), Neuropathy, Obesity, and Sleep apnea.   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   His family history includes CAD (age of onset: 89) in his father; Cancer - Colon in his brother; Healthy in his mother; Heart attack (age of onset: 33) in his father; Valvular heart disease (age of onset: 84) in his brother.Alexander Pennington reports that Alexander Pennington quit smoking about 32 years ago. His smoking use included cigarettes. Alexander Pennington has never used  smokeless tobacco. Alexander Pennington reports current alcohol use. Alexander Pennington reports that Alexander Pennington does not use drugs.  Current Outpatient Medications on File Prior to Visit  Medication Sig Dispense Refill   aspirin EC 81 MG tablet Take 1 tablet (81 mg total) by mouth daily. 90 tablet 3   Coenzyme Q10 200 MG capsule Take 1 capsule (200 mg total) by mouth daily.     empagliflozin (JARDIANCE) 25 MG TABS tablet Take 1 tablet (25 mg total) by mouth daily. 90 tablet 1   Multiple Vitamins tablet Take 1 tablet by mouth daily.     Folic Acid-Vit O8-NOM V67 ([name]) 2.5-25-1 MG TABS tablet Take 1 tablet by mouth daily. (Patient not taking: Reported on 05/16/2021) 90 tablet 3   No current facility-administered medications on file prior to visit.    ROS Review of Systems  Constitutional:  Negative for activity change, fatigue and unexpected weight change.  HENT:  Negative for congestion, ear pain, hearing loss, postnasal drip and trouble swallowing.   Eyes:  Negative for pain and visual disturbance.  Respiratory:  Negative for cough, chest tightness and shortness of breath.   Cardiovascular:  Negative for chest pain, palpitations and leg swelling.  Gastrointestinal:  Negative for abdominal distention, abdominal pain, blood in stool, constipation, diarrhea, nausea and vomiting.  Endocrine: Negative for cold intolerance, heat intolerance and polydipsia.  Genitourinary:  Negative for difficulty urinating, dysuria, flank pain, frequency and urgency.  Musculoskeletal:  Positive for arthralgias, back pain and gait problem. Negative for joint swelling.  Skin:  Negative for color change, rash and wound.  Neurological:  Positive for numbness (with pain in the feet). Negative for dizziness, syncope, speech difficulty, weakness, light-headedness and headaches.  Hematological:  Does not bruise/bleed easily.  Psychiatric/Behavioral:  Negative for  confusion, decreased concentration, dysphoric mood and sleep disturbance. The patient is not  nervous/anxious.    Objective:  BP (!) 173/86   Pulse 93   Temp 98 F (36.7 C) (Temporal)   Resp 20   Ht _0  (1.727 m)   Wt 269 lb (122 kg)   SpO2 96%   BMI 40.90 kg/m   BP Readings from Last 3 Encounters:  05/16/21 (!) 173/86  06/29/20 (!) 151/81  03/23/20 (!) 171/81    Wt Readings from Last 3 Encounters:  05/16/21 269 lb (122 kg)  06/29/20 275 lb 3.2 oz (124.8 kg)  03/23/20 280 lb 2 oz (127.1 kg)     Physical Exam Constitutional:      General: Alexander Pennington is not in acute distress.    Appearance: Alexander Pennington is well-developed.  HENT:     Head: Normocephalic and atraumatic.     Right Ear: External ear normal.     Left Ear: External ear normal.     Nose: Nose normal.  Eyes:     Conjunctiva/sclera: Conjunctivae normal.     Pupils: Pupils are equal, round, and reactive to light.  Cardiovascular:     Rate and Rhythm: Normal rate and regular rhythm.     Heart sounds: Normal heart sounds. No murmur heard. Pulmonary:     Effort: Pulmonary effort is normal. No respiratory distress.     Breath sounds: Normal breath sounds. No wheezing or rales.  Abdominal:     Palpations: Abdomen is soft.     Tenderness: There is no abdominal tenderness.  Musculoskeletal:        General: Normal range of motion.     Cervical back: Normal range of motion and neck supple.  Skin:    General: Skin is warm and dry.  Neurological:     Mental Status: Alexander Pennington is alert and oriented to person, place, and time.     Deep Tendon Reflexes: Reflexes are normal and symmetric.  Psychiatric:        Behavior: Behavior normal.        Thought Content: Thought content normal.        Judgment: Judgment normal.      Assessment & Plan:   Meko was seen today for annual exam.  Diagnoses and all orders for this visit:  Essential hypertension -     CBC with Differential/Platelet -     CMP14+EGFR -     Lipid panel -     Urinalysis  Diabetic peripheral neuropathy associated with type 2 diabetes mellitus (HCC) -      gabapentin (NEURONTIN) 600 MG tablet; TAKE 2 TABLETS THREE TIMES A DAY -     metFORMIN (GLUCOPHAGE-XR) 500 MG 24 hr tablet; Take 1 tablet (500 mg total) by mouth daily. -     CBC with Differential/Platelet -     CMP14+EGFR -     Lipid panel -     Urinalysis -     Ambulatory referral to Endocrinology  Diabetic nephropathy associated with type 2 diabetes mellitus ([place]) -     metFORMIN (GLUCOPHAGE-XR) 500 MG 24 hr tablet; Take 1 tablet (500 mg total) by mouth daily. -     CBC with Differential/Platelet -     CMP14+EGFR -     Lipid panel -     Urinalysis -     Ambulatory referral to Endocrinology  Hyperlipidemia associated with type 2 diabetes mellitus (HCC)  Testosterone deficiency -     PSA, total and free -  Testosterone,Free and Total -     Testosterone 20.25 MG/ACT (1.62%) GEL; APPLY 4 PUMPS ONTO THE SKIN DAILY -     Ambulatory referral to Endocrinology  Well adult exam  Screening for prostate cancer -     PSA, total and free  Other orders -     celecoxib (CELEBREX) 200 MG capsule; Take 1 capsule (200 mg total) by mouth 2 (two) times daily. -     tadalafil (CIALIS) 20 MG tablet; Take 1 tablet (20 mg total) by mouth every other day as needed for erectile dysfunction.     I have discontinued Mack Guise. Parsley's Pen Needles, LORazepam, BD Insulin Syringe U/F, Repatha SureClick, valsartan, sildenafil, Lantus SoloStar, and traMADol. I have also changed his metFORMIN. Additionally, I am having him maintain his Multiple Vitamins, Coenzyme D03, aspirin EC, Folic Acid-Vit U1-THY H88, empagliflozin, gabapentin, celecoxib, Testosterone, and tadalafil.  Meds ordered this encounter  Medications   gabapentin (NEURONTIN) 600 MG tablet    Sig: TAKE 2 TABLETS THREE TIMES A DAY    Dispense:  540 tablet    Refill:  3   celecoxib (CELEBREX) 200 MG capsule    Sig: Take 1 capsule (200 mg total) by mouth 2 (two) times daily.    Dispense:  180 capsule    Refill:  1   metFORMIN (GLUCOPHAGE-XR)  500 MG 24 hr tablet    Sig: Take 1 tablet (500 mg total) by mouth daily.    Dispense:  90 tablet    Refill:  3   Testosterone 20.25 MG/ACT (1.62%) GEL    Sig: APPLY 4 PUMPS ONTO THE SKIN DAILY    Dispense:  450 g    Refill:  0   tadalafil (CIALIS) 20 MG tablet    Sig: Take 1 tablet (20 mg total) by mouth every other day as needed for erectile dysfunction.    Dispense:  24 tablet    Refill:  1   Patient tells me that Alexander Pennington has moved to [facility] and the drive is too long Alexander Pennington is probably can find a new physician.  Alexander Pennington wanted to have my blessing for that today.  However, Alexander Pennington was insistent that I should go ahead and prescribe tramadol in spite of the violation of the contract.  I had to explain to him that that just was not possible.  However I did provide a bridging supply of the testosterone.  Follow-up: Return if symptoms worsen or fail to improve.  Claretta Fraise, M.D.

## 2021-05-24 ENCOUNTER — Encounter: Payer: BC Managed Care – PPO | Admitting: Family Medicine

## 2023-01-16 ENCOUNTER — Telehealth: Payer: Self-pay

## 2023-01-16 NOTE — Transitions of Care (Post Inpatient/ED Visit) (Signed)
   01/16/2023  Name: Alexander Pennington MRN: [id number] DOB: 1961-04-23  Today's TOC FU Call Status: Today's TOC FU Call Status:: Successful TOC FU Call Competed TOC FU Call Complete Date: 01/16/23  Transition Care Management Follow-up Telephone Call Date of Discharge: 01/15/23 Discharge Facility: Other ([facility]) Name of Other ([facility]) Discharge Facility: [facility] Type of Discharge: Inpatient Admission Primary Inpatient Discharge Diagnosis:: charcot How have you been since you were released from the hospital?: Better  Items Reviewed: Did you receive and understand the discharge instructions provided?: Yes Medications obtained,verified, and reconciled?: Yes (Medications Reviewed) Any new allergies since your discharge?: No Dietary orders reviewed?: Yes Do you have support at home?: Yes People in Home: spouse  Medications Reviewed Today: Medications Reviewed Today     Reviewed by Mechele Claude, MD (Physician) on 05/16/21 at 1824  Med List Status: <None>   Medication Order Taking? Sig Documenting Provider Last Dose Status Informant  aspirin EC 81 MG tablet [id number] Yes Take 1 tablet (81 mg total) by mouth daily. Marykay Lex, MD Taking Active   celecoxib (CELEBREX) 200 MG capsule [id number]  Take 1 capsule (200 mg total) by mouth 2 (two) times daily. Mechele Claude, MD  Active   Coenzyme Q10 200 MG capsule [id number] Yes Take 1 capsule (200 mg total) by mouth daily. Runell Gess, MD Taking Active Self  empagliflozin (JARDIANCE) 25 MG TABS tablet [id number] Yes Take 1 tablet (25 mg total) by mouth daily. Mechele Claude, MD Taking Active   Folic Acid-Vit B6-Vit B12 ([name]) 2.5-25-1 MG TABS tablet [id number] No Take 1 tablet by mouth daily.  Patient not taking: Reported on 05/16/2021   Mechele Claude, MD Not Taking Active   gabapentin (NEURONTIN) 600 MG tablet [id number]  TAKE 2 TABLETS THREE TIMES A Lowella Fairy, MD  Active   metFORMIN (GLUCOPHAGE-XR) 500 MG 24 hr tablet  [id number]  Take 1 tablet (500 mg total) by mouth daily. Mechele Claude, MD  Active   Multiple Vitamins tablet [id number] Yes Take 1 tablet by mouth daily. [provider] Taking Active Self           Med Note Payton Emerald, CRYSTAL N   Mon Jul 04, 2014  1:33 PM)    tadalafil (CIALIS) 20 MG tablet [id number]  Take 1 tablet (20 mg total) by mouth every other day as needed for erectile dysfunction. Mechele Claude, MD  Active   Testosterone 20.25 MG/ACT (1.62%) GEL [id number]  APPLY 4 PUMPS ONTO THE SKIN DAILY Mechele Claude, MD  Active   Med List Note ([place], Brittany C, CPhT 07/05/14 1442): [phone] patiens work number            Home Care and Equipment/Supplies: Were Home Health Services Ordered?: NA Any new equipment or medical supplies ordered?: NA  Functional Questionnaire: Do you need assistance with bathing/showering or dressing?: No Do you need assistance with meal preparation?: No Do you need assistance with eating?: No Do you have difficulty maintaining continence: No Do you need assistance with getting out of bed/getting out of a chair/moving?: No Do you have difficulty managing or taking your medications?: No  Follow up appointments reviewed: PCP Follow-up appointment confirmed?: No (patient has transferred)    Lionel December, LPN [facility] Nurse Health Advisor Direct Dial [phone]

## 2023-09-05 ENCOUNTER — Encounter: Payer: Self-pay | Admitting: Cardiology
